# Patient Record
Sex: Female | Born: 1960 | Race: White | Hispanic: No | Marital: Single | Smoking: Never smoker
Health system: Southern US, Community
[De-identification: ages and names within clinical notes are randomized; demographics above are authoritative.]

## PROBLEM LIST (undated history)

## (undated) DIAGNOSIS — E079 Disorder of thyroid, unspecified: Secondary | ICD-10-CM

## (undated) DIAGNOSIS — F4310 Post-traumatic stress disorder, unspecified: Secondary | ICD-10-CM

## (undated) DIAGNOSIS — F32A Depression, unspecified: Secondary | ICD-10-CM

## (undated) DIAGNOSIS — F419 Anxiety disorder, unspecified: Secondary | ICD-10-CM

## (undated) DIAGNOSIS — E785 Hyperlipidemia, unspecified: Secondary | ICD-10-CM

## (undated) DIAGNOSIS — Z803 Family history of malignant neoplasm of breast: Secondary | ICD-10-CM

## (undated) DIAGNOSIS — F329 Major depressive disorder, single episode, unspecified: Secondary | ICD-10-CM

## (undated) HISTORY — DX: Anxiety disorder, unspecified: F41.9

## (undated) HISTORY — DX: Depression, unspecified: F32.A

## (undated) HISTORY — DX: Post-traumatic stress disorder, unspecified: F43.10

## (undated) HISTORY — PX: BREAST BIOPSY: SHX20

## (undated) HISTORY — PX: TOTAL MASTECTOMY: SHX6129

## (undated) HISTORY — DX: Hyperlipidemia, unspecified: E78.5

## (undated) HISTORY — DX: Major depressive disorder, single episode, unspecified: F32.9

## (undated) HISTORY — DX: Family history of malignant neoplasm of breast: Z80.3

## (undated) HISTORY — PX: EYE SURGERY: SHX253

## (undated) HISTORY — DX: Disorder of thyroid, unspecified: E07.9

---

## 1997-05-24 ENCOUNTER — Ambulatory Visit (HOSPITAL_COMMUNITY): Admission: RE | Admit: 1997-05-24 | Discharge: 1997-05-24 | Payer: Self-pay | Admitting: *Deleted

## 1998-07-09 ENCOUNTER — Other Ambulatory Visit: Admission: RE | Admit: 1998-07-09 | Discharge: 1998-07-09 | Payer: Self-pay | Admitting: Obstetrics and Gynecology

## 1998-09-10 ENCOUNTER — Ambulatory Visit (HOSPITAL_COMMUNITY): Admission: RE | Admit: 1998-09-10 | Discharge: 1998-09-10 | Payer: Self-pay | Admitting: Family Medicine

## 1999-04-11 ENCOUNTER — Encounter: Payer: Self-pay | Admitting: Obstetrics and Gynecology

## 1999-04-11 ENCOUNTER — Encounter: Admission: RE | Admit: 1999-04-11 | Discharge: 1999-04-11 | Payer: Self-pay | Admitting: Obstetrics and Gynecology

## 2000-04-15 ENCOUNTER — Encounter: Payer: Self-pay | Admitting: Obstetrics and Gynecology

## 2000-04-15 ENCOUNTER — Encounter: Admission: RE | Admit: 2000-04-15 | Discharge: 2000-04-15 | Payer: Self-pay | Admitting: Obstetrics and Gynecology

## 2000-05-19 ENCOUNTER — Other Ambulatory Visit: Admission: RE | Admit: 2000-05-19 | Discharge: 2000-05-19 | Payer: Self-pay | Admitting: Obstetrics and Gynecology

## 2001-04-29 ENCOUNTER — Encounter: Admission: RE | Admit: 2001-04-29 | Discharge: 2001-04-29 | Payer: Self-pay | Admitting: Obstetrics and Gynecology

## 2001-04-29 ENCOUNTER — Encounter: Payer: Self-pay | Admitting: Obstetrics and Gynecology

## 2001-05-26 ENCOUNTER — Emergency Department (HOSPITAL_COMMUNITY): Admission: EM | Admit: 2001-05-26 | Discharge: 2001-05-26 | Payer: Self-pay | Admitting: Emergency Medicine

## 2001-05-26 ENCOUNTER — Encounter: Payer: Self-pay | Admitting: Emergency Medicine

## 2001-05-30 ENCOUNTER — Emergency Department (HOSPITAL_COMMUNITY): Admission: EM | Admit: 2001-05-30 | Discharge: 2001-05-30 | Payer: Self-pay | Admitting: Emergency Medicine

## 2001-05-30 ENCOUNTER — Encounter: Payer: Self-pay | Admitting: Emergency Medicine

## 2002-02-08 ENCOUNTER — Encounter: Admission: RE | Admit: 2002-02-08 | Discharge: 2002-02-08 | Payer: Self-pay | Admitting: Internal Medicine

## 2002-05-17 ENCOUNTER — Encounter: Payer: Self-pay | Admitting: Obstetrics and Gynecology

## 2002-05-17 ENCOUNTER — Encounter: Admission: RE | Admit: 2002-05-17 | Discharge: 2002-05-17 | Payer: Self-pay | Admitting: Obstetrics and Gynecology

## 2002-07-25 ENCOUNTER — Emergency Department (HOSPITAL_COMMUNITY): Admission: EM | Admit: 2002-07-25 | Discharge: 2002-07-25 | Payer: Self-pay | Admitting: Emergency Medicine

## 2002-07-29 ENCOUNTER — Encounter (HOSPITAL_COMMUNITY): Admission: RE | Admit: 2002-07-29 | Discharge: 2002-09-06 | Payer: Self-pay | Admitting: Emergency Medicine

## 2002-09-30 ENCOUNTER — Encounter: Payer: Self-pay | Admitting: Obstetrics and Gynecology

## 2002-09-30 ENCOUNTER — Encounter: Admission: RE | Admit: 2002-09-30 | Discharge: 2002-09-30 | Payer: Self-pay | Admitting: Obstetrics and Gynecology

## 2002-10-21 ENCOUNTER — Emergency Department (HOSPITAL_COMMUNITY): Admission: EM | Admit: 2002-10-21 | Discharge: 2002-10-22 | Payer: Self-pay | Admitting: Emergency Medicine

## 2003-05-16 ENCOUNTER — Emergency Department (HOSPITAL_COMMUNITY): Admission: EM | Admit: 2003-05-16 | Discharge: 2003-05-16 | Payer: Self-pay | Admitting: Emergency Medicine

## 2004-02-12 ENCOUNTER — Encounter: Admission: RE | Admit: 2004-02-12 | Discharge: 2004-02-12 | Payer: Self-pay | Admitting: Obstetrics and Gynecology

## 2004-02-22 ENCOUNTER — Encounter: Admission: RE | Admit: 2004-02-22 | Discharge: 2004-02-22 | Payer: Self-pay | Admitting: Obstetrics and Gynecology

## 2004-03-04 ENCOUNTER — Encounter: Admission: RE | Admit: 2004-03-04 | Discharge: 2004-03-04 | Payer: Self-pay | Admitting: Obstetrics and Gynecology

## 2004-03-08 ENCOUNTER — Encounter: Admission: RE | Admit: 2004-03-08 | Discharge: 2004-03-08 | Payer: Self-pay | Admitting: Obstetrics and Gynecology

## 2004-03-08 ENCOUNTER — Encounter (INDEPENDENT_AMBULATORY_CARE_PROVIDER_SITE_OTHER): Payer: Self-pay | Admitting: *Deleted

## 2004-07-05 ENCOUNTER — Ambulatory Visit: Payer: Self-pay | Admitting: Psychiatry

## 2004-07-05 ENCOUNTER — Other Ambulatory Visit (HOSPITAL_COMMUNITY): Admission: RE | Admit: 2004-07-05 | Discharge: 2004-07-10 | Payer: Self-pay | Admitting: Psychiatry

## 2004-11-27 ENCOUNTER — Encounter: Admission: RE | Admit: 2004-11-27 | Discharge: 2004-11-27 | Payer: Self-pay | Admitting: Obstetrics and Gynecology

## 2005-04-23 ENCOUNTER — Other Ambulatory Visit: Admission: RE | Admit: 2005-04-23 | Discharge: 2005-04-23 | Payer: Self-pay | Admitting: Obstetrics and Gynecology

## 2005-05-01 ENCOUNTER — Encounter: Admission: RE | Admit: 2005-05-01 | Discharge: 2005-05-01 | Payer: Self-pay | Admitting: Obstetrics and Gynecology

## 2005-05-06 ENCOUNTER — Encounter: Admission: RE | Admit: 2005-05-06 | Discharge: 2005-05-06 | Payer: Self-pay | Admitting: Obstetrics and Gynecology

## 2005-05-14 ENCOUNTER — Encounter: Admission: RE | Admit: 2005-05-14 | Discharge: 2005-05-14 | Payer: Self-pay | Admitting: Obstetrics and Gynecology

## 2005-09-21 ENCOUNTER — Emergency Department (HOSPITAL_COMMUNITY): Admission: EM | Admit: 2005-09-21 | Discharge: 2005-09-21 | Payer: Self-pay | Admitting: Emergency Medicine

## 2006-02-24 ENCOUNTER — Ambulatory Visit: Payer: Self-pay | Admitting: Oncology

## 2006-03-19 ENCOUNTER — Encounter: Admission: RE | Admit: 2006-03-19 | Discharge: 2006-03-19 | Payer: Self-pay | Admitting: Obstetrics and Gynecology

## 2006-07-08 ENCOUNTER — Encounter: Admission: RE | Admit: 2006-07-08 | Discharge: 2006-07-08 | Payer: Self-pay | Admitting: Obstetrics and Gynecology

## 2007-03-17 ENCOUNTER — Encounter: Admission: RE | Admit: 2007-03-17 | Discharge: 2007-03-17 | Payer: Self-pay | Admitting: Obstetrics and Gynecology

## 2007-07-21 ENCOUNTER — Encounter: Admission: RE | Admit: 2007-07-21 | Discharge: 2007-07-21 | Payer: Self-pay | Admitting: Obstetrics and Gynecology

## 2007-09-02 ENCOUNTER — Encounter: Admission: RE | Admit: 2007-09-02 | Discharge: 2007-09-02 | Payer: Self-pay | Admitting: Obstetrics and Gynecology

## 2008-03-31 ENCOUNTER — Encounter: Admission: RE | Admit: 2008-03-31 | Discharge: 2008-03-31 | Payer: Self-pay | Admitting: Obstetrics and Gynecology

## 2008-03-31 ENCOUNTER — Encounter (INDEPENDENT_AMBULATORY_CARE_PROVIDER_SITE_OTHER): Payer: Self-pay | Admitting: Obstetrics and Gynecology

## 2008-05-29 ENCOUNTER — Encounter: Admission: RE | Admit: 2008-05-29 | Discharge: 2008-05-29 | Payer: Self-pay | Admitting: Orthopedic Surgery

## 2008-07-04 ENCOUNTER — Ambulatory Visit: Payer: Self-pay | Admitting: Oncology

## 2008-07-20 LAB — CBC WITH DIFFERENTIAL (CANCER CENTER ONLY)
BASO#: 0.1 10*3/uL (ref 0.0–0.2)
BASO%: 0.8 % (ref 0.0–2.0)
EOS%: 3.2 % (ref 0.0–7.0)
Eosinophils Absolute: 0.2 10*3/uL (ref 0.0–0.5)
HCT: 39.5 % (ref 34.8–46.6)
HGB: 13.8 g/dL (ref 11.6–15.9)
LYMPH#: 2.2 10*3/uL (ref 0.9–3.3)
LYMPH%: 33.9 % (ref 14.0–48.0)
MCH: 30.9 pg (ref 26.0–34.0)
MCHC: 34.9 g/dL (ref 32.0–36.0)
MCV: 89 fL (ref 81–101)
MONO#: 0.4 10*3/uL (ref 0.1–0.9)
MONO%: 5.6 % (ref 0.0–13.0)
NEUT#: 3.6 10*3/uL (ref 1.5–6.5)
NEUT%: 56.5 % (ref 39.6–80.0)
Platelets: 240 10*3/uL (ref 145–400)
RBC: 4.44 10*6/uL (ref 3.70–5.32)
RDW: 12 % (ref 10.5–14.6)
WBC: 6.4 10*3/uL (ref 3.9–10.0)

## 2008-07-20 LAB — CMP (CANCER CENTER ONLY)
ALT(SGPT): 22 U/L (ref 10–47)
AST: 22 U/L (ref 11–38)
Albumin: 3.7 g/dL (ref 3.3–5.5)
Alkaline Phosphatase: 67 U/L (ref 26–84)
BUN, Bld: 13 mg/dL (ref 7–22)
CO2: 30 mEq/L (ref 18–33)
Calcium: 9.2 mg/dL (ref 8.0–10.3)
Chloride: 101 mEq/L (ref 98–108)
Creat: 0.7 mg/dl (ref 0.6–1.2)
Glucose, Bld: 106 mg/dL (ref 73–118)
Potassium: 3.9 mEq/L (ref 3.3–4.7)
Sodium: 140 mEq/L (ref 128–145)
Total Bilirubin: 0.5 mg/dl (ref 0.20–1.60)
Total Protein: 7.5 g/dL (ref 6.4–8.1)

## 2008-07-25 ENCOUNTER — Encounter: Admission: RE | Admit: 2008-07-25 | Discharge: 2008-07-25 | Payer: Self-pay | Admitting: Family Medicine

## 2008-07-25 ENCOUNTER — Ambulatory Visit: Payer: Self-pay | Admitting: Genetic Counselor

## 2008-09-04 ENCOUNTER — Ambulatory Visit: Payer: Self-pay | Admitting: Hematology

## 2008-09-11 ENCOUNTER — Encounter: Admission: RE | Admit: 2008-09-11 | Discharge: 2008-09-11 | Payer: Self-pay | Admitting: Family Medicine

## 2008-10-09 ENCOUNTER — Encounter: Admission: RE | Admit: 2008-10-09 | Discharge: 2008-10-09 | Payer: Self-pay | Admitting: Family Medicine

## 2008-10-11 ENCOUNTER — Ambulatory Visit (HOSPITAL_BASED_OUTPATIENT_CLINIC_OR_DEPARTMENT_OTHER): Admission: RE | Admit: 2008-10-11 | Discharge: 2008-10-11 | Payer: Self-pay | Admitting: Orthopedic Surgery

## 2008-12-19 ENCOUNTER — Ambulatory Visit: Payer: Self-pay | Admitting: Oncology

## 2009-02-24 IMAGING — MG MM DIGITAL SCREENING BILAT W/ CAD
5 series · 5 of 5 positions shown · non-contrast
Comparison: Prior studies.

DG SCREEN MAMMOGRAM BILATERAL
Bilateral CC and MLO view(s) were taken.
Technologist: Hibo Daboss

DIGITAL SCREENING MAMMOGRAM WITH CAD:

[R CC (1 of 2)]
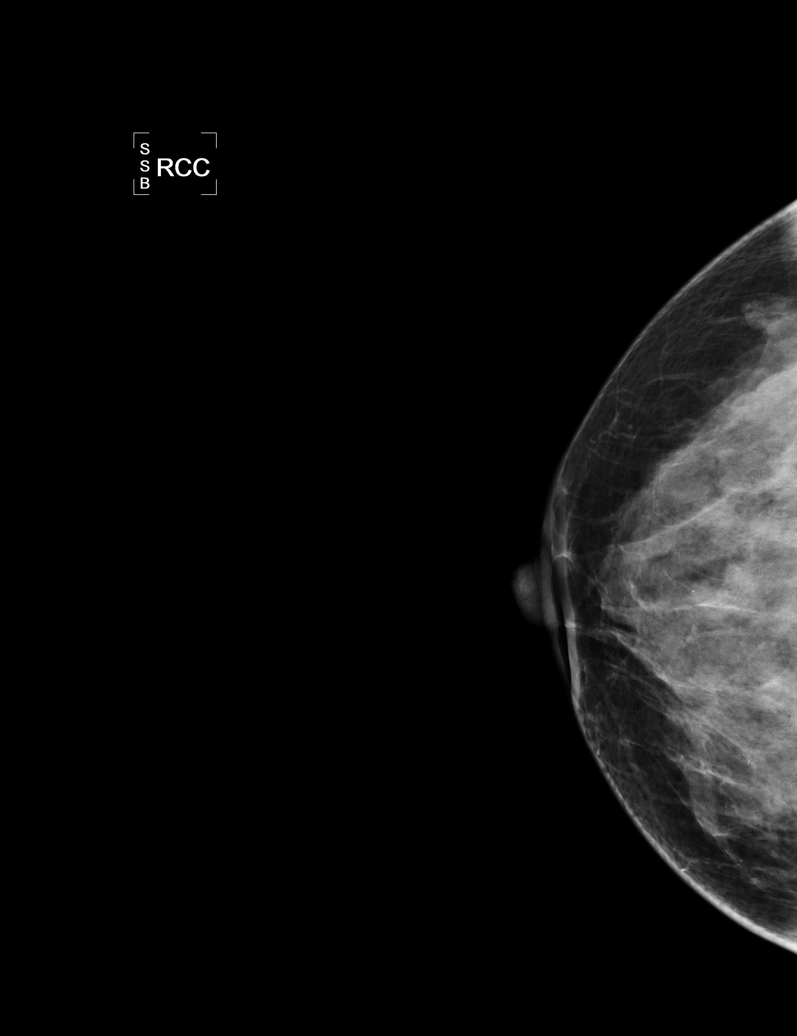

[L CC]
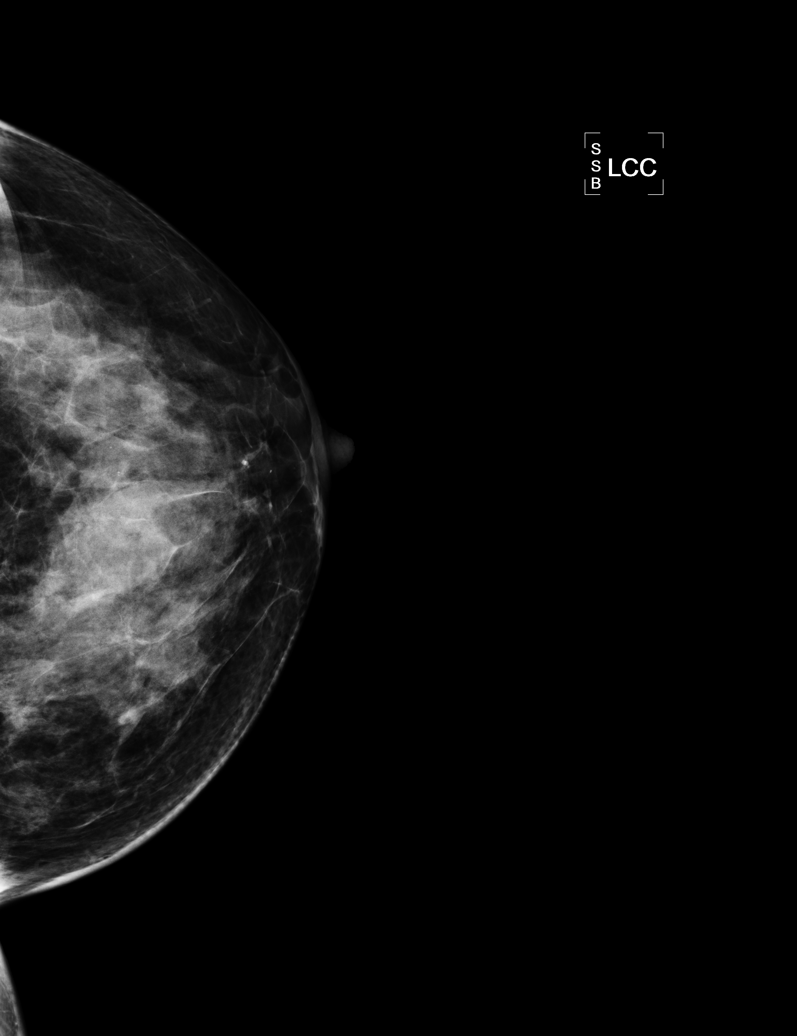

[L MLO]
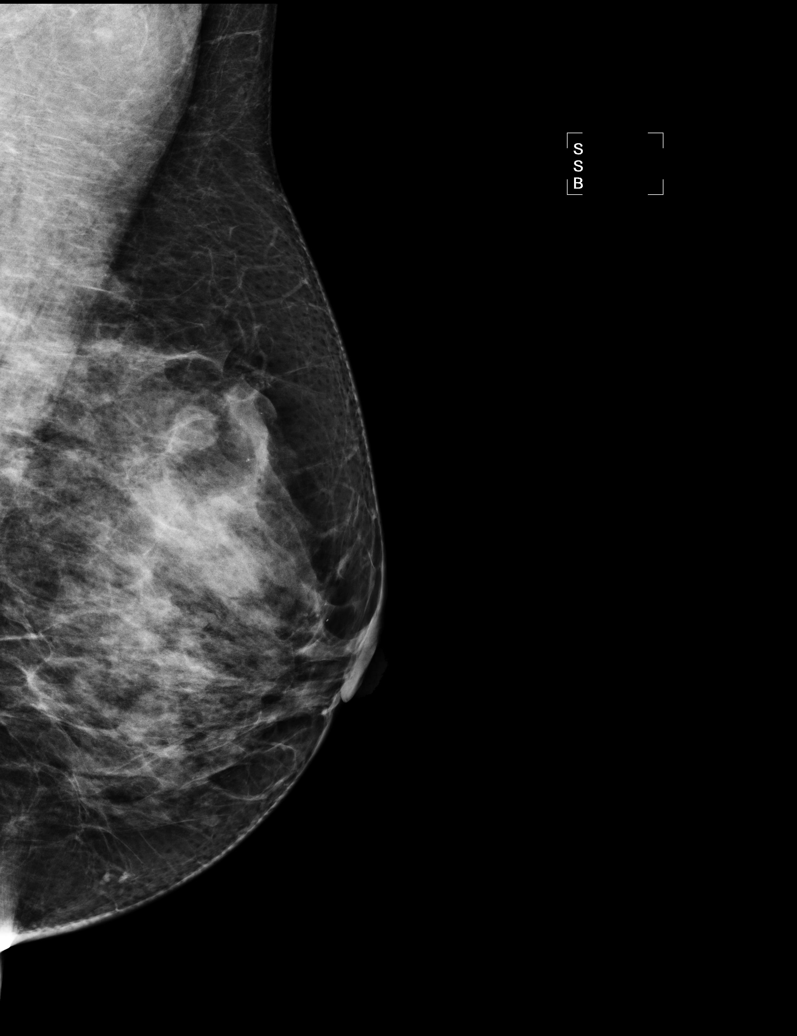

[R MLO]
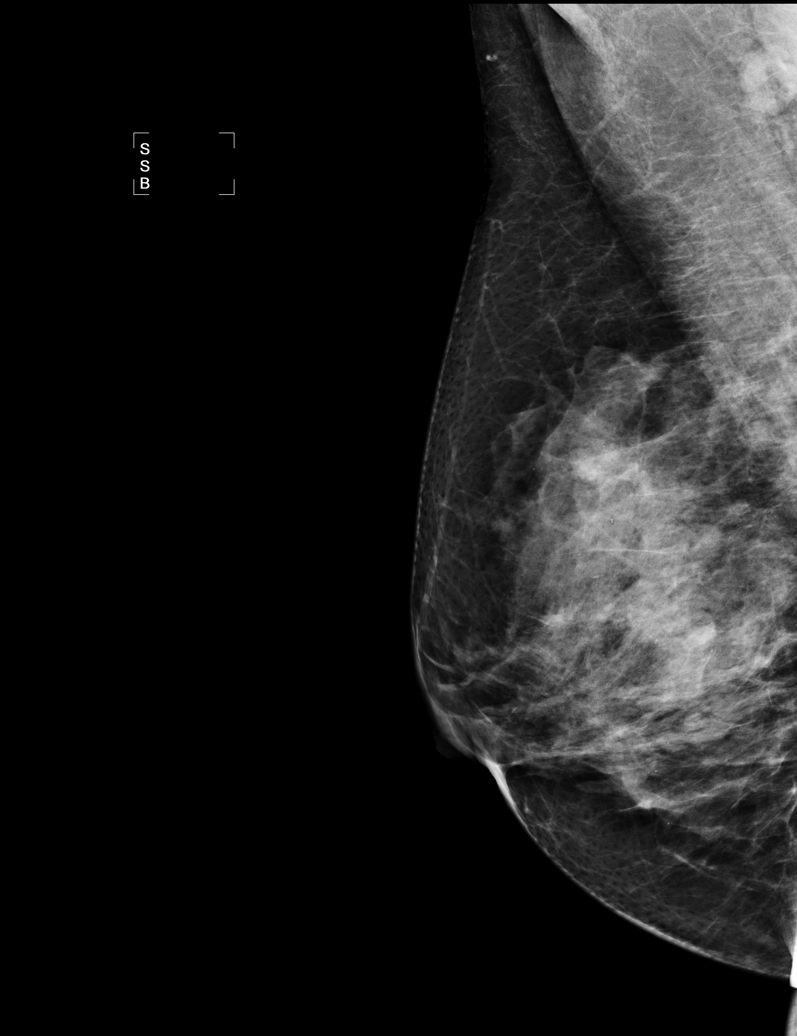

[R CC (2 of 2)]
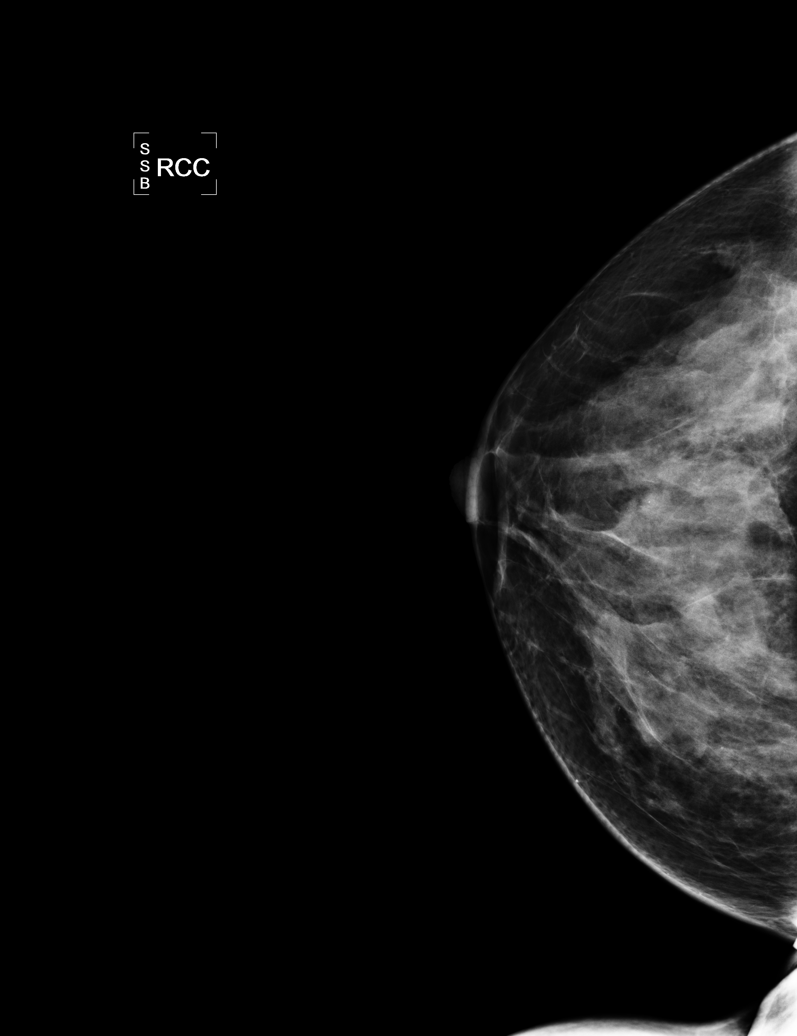

[5 of 5 positions shown; findings below may reference images not displayed]

The breast tissue is extremely dense.  There is no dominant mass, architectural distortion or 
calcification to suggest malignancy.
IMPRESSION: No mammographic evidence of malignancy.  Suggest yearly screening mammography.

ASSESSMENT: Negative - BI-RADS 1

Screening mammogram in 1 year.
ANALYZED BY COMPUTER AIDED DETECTION. , THIS PROCEDURE WAS A DIGITAL MAMMOGRAM.

## 2009-07-31 ENCOUNTER — Encounter: Admission: RE | Admit: 2009-07-31 | Discharge: 2009-07-31 | Payer: Self-pay | Admitting: Family Medicine

## 2009-09-03 ENCOUNTER — Encounter: Admission: RE | Admit: 2009-09-03 | Discharge: 2009-09-03 | Payer: Self-pay | Admitting: Family Medicine

## 2009-10-09 ENCOUNTER — Other Ambulatory Visit: Admission: RE | Admit: 2009-10-09 | Discharge: 2009-10-09 | Payer: Self-pay | Admitting: Family Medicine

## 2010-01-29 ENCOUNTER — Emergency Department (HOSPITAL_COMMUNITY)
Admission: EM | Admit: 2010-01-29 | Discharge: 2010-01-29 | Payer: Self-pay | Source: Home / Self Care | Admitting: Emergency Medicine

## 2010-02-04 LAB — URINALYSIS, ROUTINE W REFLEX MICROSCOPIC
Bilirubin Urine: NEGATIVE
Hgb urine dipstick: NEGATIVE
Ketones, ur: NEGATIVE mg/dL
Nitrite: NEGATIVE
Protein, ur: NEGATIVE mg/dL
Specific Gravity, Urine: 1.01 (ref 1.005–1.030)
Urine Glucose, Fasting: NEGATIVE mg/dL
Urobilinogen, UA: 0.2 mg/dL (ref 0.0–1.0)
pH: 6.5 (ref 5.0–8.0)

## 2010-02-09 ENCOUNTER — Encounter: Payer: Self-pay | Admitting: Obstetrics and Gynecology

## 2010-02-10 ENCOUNTER — Encounter: Payer: Self-pay | Admitting: Obstetrics and Gynecology

## 2010-04-26 LAB — POCT HEMOGLOBIN-HEMACUE: Hemoglobin: 13.6 g/dL (ref 12.0–15.0)

## 2010-05-10 ENCOUNTER — Other Ambulatory Visit: Payer: Self-pay | Admitting: Gastroenterology

## 2010-06-11 ENCOUNTER — Other Ambulatory Visit: Payer: Self-pay | Admitting: Family Medicine

## 2010-06-11 DIAGNOSIS — N6325 Unspecified lump in the left breast, overlapping quadrants: Secondary | ICD-10-CM

## 2010-06-11 DIAGNOSIS — N6315 Unspecified lump in the right breast, overlapping quadrants: Secondary | ICD-10-CM

## 2010-06-13 ENCOUNTER — Other Ambulatory Visit: Payer: Self-pay | Admitting: Family Medicine

## 2010-06-13 DIAGNOSIS — N6325 Unspecified lump in the left breast, overlapping quadrants: Secondary | ICD-10-CM

## 2010-06-13 DIAGNOSIS — N6315 Unspecified lump in the right breast, overlapping quadrants: Secondary | ICD-10-CM

## 2010-06-14 ENCOUNTER — Ambulatory Visit
Admission: RE | Admit: 2010-06-14 | Discharge: 2010-06-14 | Disposition: A | Discharge status: Home / Self Care | Payer: 59 | Source: Ambulatory Visit | Attending: Family Medicine | Admitting: Family Medicine

## 2010-06-14 DIAGNOSIS — N6325 Unspecified lump in the left breast, overlapping quadrants: Secondary | ICD-10-CM

## 2010-06-14 DIAGNOSIS — N6315 Unspecified lump in the right breast, overlapping quadrants: Secondary | ICD-10-CM

## 2010-07-12 ENCOUNTER — Other Ambulatory Visit: Payer: Self-pay | Admitting: Family Medicine

## 2010-07-12 DIAGNOSIS — Z803 Family history of malignant neoplasm of breast: Secondary | ICD-10-CM

## 2010-07-25 ENCOUNTER — Ambulatory Visit
Admission: RE | Admit: 2010-07-25 | Discharge: 2010-07-25 | Disposition: A | Discharge status: Home / Self Care | Payer: 59 | Source: Ambulatory Visit | Attending: Family Medicine | Admitting: Family Medicine

## 2010-07-25 DIAGNOSIS — Z803 Family history of malignant neoplasm of breast: Secondary | ICD-10-CM

## 2011-03-24 ENCOUNTER — Other Ambulatory Visit: Payer: Self-pay | Admitting: Physical Medicine and Rehabilitation

## 2011-03-24 DIAGNOSIS — M549 Dorsalgia, unspecified: Secondary | ICD-10-CM

## 2011-03-25 ENCOUNTER — Other Ambulatory Visit: Payer: Self-pay | Admitting: Physical Medicine and Rehabilitation

## 2011-03-25 ENCOUNTER — Ambulatory Visit
Admission: RE | Admit: 2011-03-25 | Discharge: 2011-03-25 | Disposition: A | Discharge status: Home / Self Care | Payer: 59 | Source: Ambulatory Visit | Attending: Physical Medicine and Rehabilitation | Admitting: Physical Medicine and Rehabilitation

## 2011-03-25 DIAGNOSIS — M47816 Spondylosis without myelopathy or radiculopathy, lumbar region: Secondary | ICD-10-CM

## 2011-03-25 DIAGNOSIS — M549 Dorsalgia, unspecified: Secondary | ICD-10-CM

## 2011-03-25 NOTE — Discharge Instructions (Signed)

## 2011-06-19 ENCOUNTER — Other Ambulatory Visit: Payer: Self-pay | Admitting: Family Medicine

## 2011-06-19 DIAGNOSIS — Z1231 Encounter for screening mammogram for malignant neoplasm of breast: Secondary | ICD-10-CM

## 2011-07-08 ENCOUNTER — Ambulatory Visit
Admission: RE | Admit: 2011-07-08 | Discharge: 2011-07-08 | Disposition: A | Discharge status: Home / Self Care | Payer: 59 | Source: Ambulatory Visit | Attending: Family Medicine | Admitting: Family Medicine

## 2011-07-08 DIAGNOSIS — Z1231 Encounter for screening mammogram for malignant neoplasm of breast: Secondary | ICD-10-CM

## 2011-07-18 ENCOUNTER — Telehealth: Payer: Self-pay | Admitting: *Deleted

## 2011-07-18 NOTE — Telephone Encounter (Signed)
Please schedule her to see me as new patient on 7/3 at 2:30 as new patient = 1hour

## 2011-07-18 NOTE — Telephone Encounter (Signed)
Pt called states" I was a patient of Dr,. Welton Flakes about 3-4 years ago. My mom and both sisters have breast cancer, I had BRCA tests done and I'd like to come in a talk to Dr. Welton Flakes about where I'm at with Breast cancer.I'm thinking about having prophylactic surgery. I dont have Breast cancer anymore so I'm not being followed by any one at this time. I do see Dr. Yolanda Bonine and I've had some scans and things done.   Discussed with pt I would forward her concerns to MD for review. Pt will be notified with more information regarding re-establishing care.

## 2011-07-21 ENCOUNTER — Other Ambulatory Visit: Payer: Self-pay | Admitting: Medical Oncology

## 2011-07-21 ENCOUNTER — Telehealth: Payer: Self-pay | Admitting: Oncology

## 2011-07-21 NOTE — Telephone Encounter (Signed)
S/w the pt and she is aware of her new pt 07/23/2011@2 :00pm.

## 2011-07-23 ENCOUNTER — Telehealth: Payer: Self-pay | Admitting: *Deleted

## 2011-07-23 ENCOUNTER — Other Ambulatory Visit (HOSPITAL_BASED_OUTPATIENT_CLINIC_OR_DEPARTMENT_OTHER): Payer: 59 | Admitting: Lab

## 2011-07-23 ENCOUNTER — Ambulatory Visit (HOSPITAL_BASED_OUTPATIENT_CLINIC_OR_DEPARTMENT_OTHER): Payer: 59 | Admitting: Oncology

## 2011-07-23 ENCOUNTER — Encounter: Payer: Self-pay | Admitting: Oncology

## 2011-07-23 ENCOUNTER — Ambulatory Visit: Payer: 59

## 2011-07-23 VITALS — BP 117/79 | HR 84 | Temp 98.8°F | Ht 61.0 in | Wt 155.1 lb

## 2011-07-23 DIAGNOSIS — Z801 Family history of malignant neoplasm of trachea, bronchus and lung: Secondary | ICD-10-CM

## 2011-07-23 DIAGNOSIS — Z803 Family history of malignant neoplasm of breast: Secondary | ICD-10-CM | POA: Insufficient documentation

## 2011-07-23 DIAGNOSIS — C50919 Malignant neoplasm of unspecified site of unspecified female breast: Secondary | ICD-10-CM

## 2011-07-23 HISTORY — DX: Family history of malignant neoplasm of breast: Z80.3

## 2011-07-23 LAB — COMPREHENSIVE METABOLIC PANEL
ALT: 15 U/L (ref 0–35)
AST: 17 U/L (ref 0–37)
Albumin: 4.3 g/dL (ref 3.5–5.2)
Alkaline Phosphatase: 55 U/L (ref 39–117)
BUN: 8 mg/dL (ref 6–23)
CO2: 28 mEq/L (ref 19–32)
Calcium: 9.6 mg/dL (ref 8.4–10.5)
Chloride: 103 mEq/L (ref 96–112)
Creatinine, Ser: 0.8 mg/dL (ref 0.50–1.10)
Glucose, Bld: 120 mg/dL — ABNORMAL HIGH (ref 70–99)
Potassium: 4.4 mEq/L (ref 3.5–5.3)
Sodium: 138 mEq/L (ref 135–145)
Total Bilirubin: 0.3 mg/dL (ref 0.3–1.2)
Total Protein: 6.6 g/dL (ref 6.0–8.3)

## 2011-07-23 LAB — CBC WITH DIFFERENTIAL/PLATELET
BASO%: 0.7 % (ref 0.0–2.0)
Basophils Absolute: 0.1 10*3/uL (ref 0.0–0.1)
EOS%: 2.9 % (ref 0.0–7.0)
Eosinophils Absolute: 0.2 10*3/uL (ref 0.0–0.5)
HCT: 40.8 % (ref 34.8–46.6)
HGB: 13.3 g/dL (ref 11.6–15.9)
LYMPH%: 31 % (ref 14.0–49.7)
MCH: 31.1 pg (ref 25.1–34.0)
MCHC: 32.7 g/dL (ref 31.5–36.0)
MCV: 95 fL (ref 79.5–101.0)
MONO#: 0.5 10*3/uL (ref 0.1–0.9)
MONO%: 6.6 % (ref 0.0–14.0)
NEUT#: 4.4 10*3/uL (ref 1.5–6.5)
NEUT%: 58.8 % (ref 38.4–76.8)
Platelets: 196 10*3/uL (ref 145–400)
RBC: 4.3 10*6/uL (ref 3.70–5.45)
RDW: 12.4 % (ref 11.2–14.5)
WBC: 7.5 10*3/uL (ref 3.9–10.3)
lymph#: 2.3 10*3/uL (ref 0.9–3.3)

## 2011-07-23 NOTE — Progress Notes (Signed)
OFFICE PROGRESS NOTE  CC Dr. Jillyn Hidden  DIAGNOSIS: 51 year old female who I originally saw back in 07/20/2008 4 counseling for risk reduction. She has a very strong family history of breast cancer but patient herself has no breast cancer or atypical Ductal hyperplasia or microcalcifications.  PRIOR THERAPY:  Patient was originally seen by me back in July 2010 when she presented for counseling regarding ways of her reducing her risk of developing breast cancer. She has a very strong family history of breast cancer in several of her first-degree and second-degree relatives. Her mother had stage I breast cancer in 2000 she was 55 years old. Patient's mom had a mastectomy and then was treated with tamoxifen. Patient had 2 maternal aunts with history of breast cancer. Several aunts with lung cancer. She also had a sister with breast cancer at that was premenopausal diagnosed in 1994.Most recently patient's second sister also was diagnosed with breast cancer was 4 was 48 in June of 2010. This was premenopausal invasive and lobular carcinoma. She was a stage II. This sister apparently underwent mastectomies and nephrectomies. Most recently patient has another sibling.developed breast cancer as well. Patient herself has had multiple breast biopsies including biopsies for microcalcifications. 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 She herself has not been diagnosed with breast cancer. Patient is undergoing yearly mammograms as well as yearly MRI do to her high risk.  Patient has had BRCA1 and 2 testing and she was negative. She did meet with Annia Friendly her genetic counselor back in 2010. Patient and I have discussed chemoprevention with tamoxifen but patient never followed up. She tells me that there was a lot going on in her family and therefore was not able to keep her appointments with me.  CURRENT THERAPY:Patient is here for counseling regarding her risk of  cancers since the diagnosis of another sister having been diagnosed with breast cancer this is her younger sister.  INTERVAL HISTORY: COLTON ENGDAHL 51 y.o. female returns for Followup visit. She remains very concerned about her risk of developing breast cancer. Now she has 3 first degree relatives including mother and 2 sisters. The mother was tested for BRCA1 and 2 and was found to be negative she also had marked testing and that was negative as well. Patient 37 year old sister has been tested as well for BRCA1 and 2 and she is negative as well. The 23 year old sister has not had any testing for the BRCA gene. Patient herself again had testing and she was negative for both BRCA1 and BRCA2 gene tests. Patient is inquiring whether there is another form of genetic predisposition that may be present in her family. She also is thinking about the possibility of having prophylactic mastectomies possibly. But she once all the facts once to know if there is another gene at work in her family that is leading to such high incidence of breast cancer.  MEDICAL HISTORY:No past medical history on file.  ALLERGIES:   has no known allergies.  MEDICATIONS:  Current Outpatient Prescriptions  Medication Sig Dispense Refill  . cetirizine (ZYRTEC) 10 MG tablet Take 10 mg by mouth as needed.      . clonazePAM (KLONOPIN) 2 MG tablet Take 6 mg by mouth at bedtime as needed.      . divalproex (DEPAKOTE) 125 MG DR tablet Take 125 mg by mouth once.      Marland Kitchen HYDROcodone-acetaminophen (NORCO) 7.5-325 MG per tablet Take 1 tablet by mouth every  6 (six) hours as needed.      . meloxicam (MOBIC) 15 MG tablet Take 15 mg by mouth as needed.      . SUMAtriptan-naproxen (TREXIMET) 85-500 MG per tablet Take 1 tablet by mouth as needed.      . traZODone (DESYREL) 100 MG tablet Take 100 mg by mouth at bedtime.      Marland Kitchen venlafaxine (EFFEXOR) 75 MG tablet Take 75 mg by mouth once.      . Vitamin D, Ergocalciferol, (DRISDOL) 50000  UNITS CAPS Take 50,000 Units by mouth every 7 (seven) days.        SURGICAL HISTORY: No past surgical history on file.  REVIEW OF SYSTEMS:  No done  PHYSICAL EXAMINATION: not performed  ECOG PERFORMANCE STATUS: 0 - Asymptomatic  Blood pressure 117/79, pulse 84, temperature 98.8 F (37.1 C), temperature source Oral, height 5\' 1"  (1.549 m), weight 155 lb 1.6 oz (70.353 kg).  LABORATORY DATA: Lab Results  Component Value Date   WBC 7.5 07/23/2011   HGB 13.3 07/23/2011   HCT 40.8 07/23/2011   MCV 95.0 07/23/2011   PLT 196 07/23/2011      Chemistry      Component Value Date/Time   NA 140 07/20/2008 1030   K 3.9 07/20/2008 1030   CL 101 07/20/2008 1030   CO2 30 07/20/2008 1030   BUN 13 07/20/2008 1030   CREATININE 0.7 07/20/2008 1030      Component Value Date/Time   CALCIUM 9.2 07/20/2008 1030   ALKPHOS 67 07/20/2008 1030   AST 22 07/20/2008 1030   BILITOT 0.50 07/20/2008 1030       RADIOGRAPHIC STUDIES:  Mm Digital Screening  07/09/2011  *RADIOLOGY REPORT*  Clinical Data: Screening. The patient has a strong family history of breast cancer.  Cancer was diagnosed in her sisters at age 8 and 25, cancer in her mother at age 54.  Cancer was diagnosed in her aunt at age 31  DIGITAL SCREENING MAMMOGRAM WITH CAD  Comparison:  Previous exams  Findings: The breast tissue is extremely dense. No suspicious masses, architectural distortion, or calcifications are present.  Images were processed with CAD.  IMPRESSION: No evidence for malignancy.  Based on the Tyrer-Cuzick risk assessment model, the patient has a 25% lifetime risk of developing breast cancer.  By the recommendations of the American Cancer Society, annual MRI in and annual mammography are recommended.  A result letter of this screening mammogram will be mailed directly to the patient.  RECOMMENDATION: Screening mammogram in one year. (Code:SM-B-01Y)  BI-RADS CATEGORY 1:  Negative  Original Report Authenticated By: Patterson Hammersmith, M.D.     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 I do think that the patient is at increased risk for developing a primary breast malignancy. I would possibly estimate her risk in the 40 and 50% range based on her family history. Patient's perceived risk is at 70%. But she wants to collect all the facts prior to making any kind of decisions in terms of risk reducing surgeries. We again discussed chemoprevention with tamoxifen and at this time she has not open to this but possibly later. She however is willing to be seen by her surgeon at some point and she is also willing to be seen by the genetic counselor for further risk assessment especially possibly more testing for different genes.   PLAN: I have set her up to be seen by our genetic counselor, Maylon Cos. I spoke  to the genetic counselor myself. I will also eventually set her up to be seen by Dr. Everardo Beals To discuss risk reducing surgeries.In the meantime she will continue to get her mammograms as well as MRIs and do self breast examinations as well as get clinical exams. I again spoke to her about tamoxifen.   All questions were answered. The patient knows to call the clinic with any problems, questions or concerns. We can certainly see the patient much sooner if necessary.  I spent 25 minutes counseling the patient face to face. The total time spent in the appointment was 30 minutes.    Drue Second, MD Medical/Oncology [facility] [phone] (beeper) [phone] (Office)  07/23/2011, 4:40 PM

## 2011-07-23 NOTE — Progress Notes (Signed)
Patient came in today as a new patient and she has one insurance [employer].I did explain to her our financial assistance and co-pay assistance program we offer here at the cancer center,she said she should be [place] with her insurance.

## 2011-07-23 NOTE — Telephone Encounter (Signed)
left voice message to inform the patient of the genetics counseling 09-08-2011

## 2011-07-23 NOTE — Patient Instructions (Addendum)
1. Refer to Diana Ayers

## 2011-08-04 ENCOUNTER — Telehealth: Payer: Self-pay | Admitting: *Deleted

## 2011-08-04 NOTE — Telephone Encounter (Signed)
Patient called wanting to scheduled a genetic appt and I told patient that she was already scheduled for 09/08/11.  Confirmed that date and time was good for her.

## 2011-08-05 ENCOUNTER — Telehealth: Payer: Self-pay | Admitting: *Deleted

## 2011-08-05 NOTE — Telephone Encounter (Signed)
Patient called stating that she is going to be out of town the week of 8/19 and needs to reschedule her appt.  Confirmed 09/25/11 genetic appt w/ pt.

## 2011-08-07 ENCOUNTER — Other Ambulatory Visit: Payer: Self-pay | Admitting: Family Medicine

## 2011-08-07 DIAGNOSIS — Z803 Family history of malignant neoplasm of breast: Secondary | ICD-10-CM

## 2011-08-14 ENCOUNTER — Ambulatory Visit
Admission: RE | Admit: 2011-08-14 | Discharge: 2011-08-14 | Disposition: A | Discharge status: Home / Self Care | Payer: 59 | Source: Ambulatory Visit | Attending: Family Medicine | Admitting: Family Medicine

## 2011-08-14 DIAGNOSIS — Z803 Family history of malignant neoplasm of breast: Secondary | ICD-10-CM

## 2011-08-14 MED ORDER — GADOBENATE DIMEGLUMINE 529 MG/ML IV SOLN
14.0000 mL | Freq: Once | INTRAVENOUS | Status: AC | PRN
  Administered 2011-08-14: 14 mL via INTRAVENOUS

## 2011-08-19 ENCOUNTER — Telehealth: Payer: Self-pay | Admitting: *Deleted

## 2011-08-19 NOTE — Telephone Encounter (Signed)
Called patient since I received a cancellation and confirmed 08/25/11 genetic appt w/ pt.

## 2011-08-25 ENCOUNTER — Ambulatory Visit (HOSPITAL_BASED_OUTPATIENT_CLINIC_OR_DEPARTMENT_OTHER): Payer: 59 | Admitting: Genetic Counselor

## 2011-08-25 ENCOUNTER — Other Ambulatory Visit: Payer: 59 | Admitting: Lab

## 2011-08-25 DIAGNOSIS — Z803 Family history of malignant neoplasm of breast: Secondary | ICD-10-CM

## 2011-08-25 DIAGNOSIS — IMO0002 Reserved for concepts with insufficient information to code with codable children: Secondary | ICD-10-CM

## 2011-08-25 DIAGNOSIS — Z801 Family history of malignant neoplasm of trachea, bronchus and lung: Secondary | ICD-10-CM

## 2011-08-26 ENCOUNTER — Encounter: Payer: Self-pay | Admitting: Genetic Counselor

## 2011-08-26 NOTE — Progress Notes (Signed)
Dr.  Welton Flakes requested a consultation for genetic counseling and risk assessment for Diana Ayers, a 51 y.o. female, for discussion of her family history of breast cancer. She presents to clinic today to discuss the possibility of a genetic predisposition to cancer, and to further clarify her risks, as well as her family members' risks for cancer.   HISTORY OF PRESENT ILLNESS: Diana Ayers is a 51 y.o. female with no personal history of cancer.  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  Past Medical History  Diagnosis Date  . Anxiety   . FHx: malignant neoplasm of breast in first degree relative 07/23/2011    Past Surgical History  Procedure Date  . Breast biopsy     History  Substance Use Topics  . Smoking status: Not on file  . Smokeless tobacco: Not on file  . Alcohol Use:     REPRODUCTIVE HISTORY AND PERSONAL RISK ASSESSMENT FACTORS: Menarche was at age 74.   Premenopausal Uterus Intact: Yes Ovaries Intact: Yes [id number] , first live birth at age N/A  She has not previously undergone treatment for infertility.   Never used OCPs   She has not used HRT in the past.    FAMILY HISTORY:  We obtained a detailed, 4-generation family history.  Significant diagnoses are listed below: Family History  Problem Relation Age of Onset  . Breast cancer Mother 40    BRCA/BART negative  . Lung cancer Mother 42    smoker  . Breast cancer Sister 49  . Melanoma Sister 23  . Dementia Father   . Breast cancer Maternal Aunt 70  . Breast cancer Sister 91    BRCA-/no BART  . Lung cancer Maternal Uncle     smoker  . Lung cancer Maternal Uncle     smoker  . 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  She was tested previously for BRCA mutations, and was found to be negative, but was not tested for BART mutations.  Her older sister was diagnosed with breast cancer at age 55, and tested negative for BRCA  mutations, but was not tested for BART mutations.  Her younger sister was diagnosed with melanoma at age 17, and breast cancer at age 11.  The patient's mother was diagnosed with lung cancer at age 22 and breast cancer at age 62.  Her mother tested negative for both BRCA and BART.  The patient has a maternal aunt who also had breast cancer and died of it in her 59s.  There are two maternal uncles who had lung cancer.  They were smokers.  There are not other reported cancers on either side of the family.  Patient's maternal ancestors are of [place] descent, and paternal ancestors are of [place] descent. There is no reported Ashkenazi Jewish ancestry. There is no known consanguinity.  GENETIC COUNSELING RISK ASSESSMENT, DISCUSSION, AND SUGGESTED FOLLOW UP: We reviewed the natural history and genetic etiology of sporadic, familial and hereditary cancer syndromes.   About 5-10% of breast cancer is hereditary.  Of this, about 85% is the result of a BRCA1 or BRCA2 mutation.  We reviewed the red flags of hereditary cancer syndromes and the dominant inheritance patterns.  Since her BRCA testing is negative, and her mother tested negative for BART mutations, we discussed that we could be testing for the wrong gene.  We discussed gene panels, and that several cancer genes that  are associated with breast cancer can be tested at the same time.  Because of the early onset breast cancer and multiple affected family members that are in the patient's family, we will order a Theme park manager through [employer]  The patient's family history is suggestive of the following possible diagnosis: hereditary cancer syndrome  We discussed that identification of a hereditary cancer syndrome may help her care providers tailor the patients medical management. If a mutation indicating hereditary cancer syndroem is detected in this case, the [employer] recommendations would include increased cancer  surveillance and possible prophylactic sugery. If a mutation is detected, the patient will be referred back to the referring provider and to any additional appropriate care providers to discuss the relevant options.   If a mutation is not found in the patient, this will decrease the likelihood of hereditary cancer syndrome as the explanation for her family history of breast cancer. Cancer surveillance options would be discussed for the patient according to the appropriate standard National Comprehensive Cancer Network and American Cancer Society guidelines, with consideration of their personal and family history risk factors. In this case, the patient will be referred back to their care providers for discussions of management.   Based on the patient's personal and family history, statistical models Dondra Spry Model; Tyrer Cusik)  and literature data were used to estimate her risk of developing breast cancer. These estimate her lifetime risk of developing breast cancer to be approximately 37.8% to 38.4%. This estimation does not take into account any genetic testing results.   After considering the risks, benefits, and limitations, the patient provided informed consent for  the following  testing: [facility] through [employer]   Per the patient's request, we will contact her by telephone to discuss these results. A follow up genetic counseling visit will be scheduled if indicated.  The patient was seen for a total of 90 minutes, greater than 50% of which was spent face-to-face counseling.  This plan is being carried out per Dr. Milta Deiters recommendations.  This note will also be sent to the referring provider via the electronic medical record. The patient will be supplied with a summary of this genetic counseling discussion as well as educational information on the discussed hereditary cancer syndromes following the conclusion of their visit.   Patient was discussed with Dr. Drue Second.   _______________________________________________________________________ For Office Staff:  Number of people involved in session: 2 Was an Intern/ student involved with case: not applicable

## 2011-09-08 ENCOUNTER — Encounter: Payer: 59 | Admitting: Genetic Counselor

## 2011-09-08 ENCOUNTER — Other Ambulatory Visit: Payer: 59 | Admitting: Lab

## 2011-09-18 ENCOUNTER — Encounter: Payer: 59 | Admitting: Genetic Counselor

## 2011-09-25 ENCOUNTER — Other Ambulatory Visit: Payer: 59 | Admitting: Lab

## 2011-09-25 ENCOUNTER — Encounter: Payer: 59 | Admitting: Genetic Counselor

## 2011-12-04 ENCOUNTER — Telehealth: Payer: Self-pay | Admitting: Genetic Counselor

## 2011-12-04 NOTE — Telephone Encounter (Signed)
Revealed negative BreastNext panel testing.  Discussed that if her sisters or mother tested positive, then she would have a 'true negative' test.

## 2011-12-05 ENCOUNTER — Encounter: Payer: Self-pay | Admitting: Genetic Counselor

## 2012-06-30 ENCOUNTER — Other Ambulatory Visit: Payer: Self-pay

## 2012-06-30 DIAGNOSIS — Z803 Family history of malignant neoplasm of breast: Secondary | ICD-10-CM

## 2012-06-30 DIAGNOSIS — Z1231 Encounter for screening mammogram for malignant neoplasm of breast: Secondary | ICD-10-CM

## 2012-06-30 DIAGNOSIS — N6452 Nipple discharge: Secondary | ICD-10-CM

## 2012-06-30 DIAGNOSIS — N6019 Diffuse cystic mastopathy of unspecified breast: Secondary | ICD-10-CM

## 2012-07-14 ENCOUNTER — Other Ambulatory Visit: Payer: Self-pay | Admitting: Family Medicine

## 2012-07-14 DIAGNOSIS — N6452 Nipple discharge: Secondary | ICD-10-CM

## 2012-07-14 DIAGNOSIS — N6019 Diffuse cystic mastopathy of unspecified breast: Secondary | ICD-10-CM

## 2012-07-14 DIAGNOSIS — Z803 Family history of malignant neoplasm of breast: Secondary | ICD-10-CM

## 2012-07-21 ENCOUNTER — Other Ambulatory Visit: Payer: Self-pay | Admitting: Family Medicine

## 2012-07-21 ENCOUNTER — Ambulatory Visit
Admission: RE | Admit: 2012-07-21 | Discharge: 2012-07-21 | Disposition: A | Discharge status: Home / Self Care | Payer: 59 | Source: Ambulatory Visit | Attending: Family Medicine | Admitting: Family Medicine

## 2012-07-21 DIAGNOSIS — N6019 Diffuse cystic mastopathy of unspecified breast: Secondary | ICD-10-CM

## 2012-07-21 DIAGNOSIS — Z803 Family history of malignant neoplasm of breast: Secondary | ICD-10-CM

## 2012-07-21 DIAGNOSIS — N6452 Nipple discharge: Secondary | ICD-10-CM

## 2012-07-26 ENCOUNTER — Other Ambulatory Visit: Payer: Self-pay | Admitting: Family Medicine

## 2012-07-26 DIAGNOSIS — Z87898 Personal history of other specified conditions: Secondary | ICD-10-CM

## 2012-07-26 DIAGNOSIS — Z803 Family history of malignant neoplasm of breast: Secondary | ICD-10-CM

## 2012-07-26 DIAGNOSIS — N6042 Mammary duct ectasia of left breast: Secondary | ICD-10-CM

## 2012-07-27 ENCOUNTER — Other Ambulatory Visit: Payer: 59

## 2012-07-30 ENCOUNTER — Ambulatory Visit: Payer: 59

## 2012-08-09 ENCOUNTER — Other Ambulatory Visit: Payer: 59

## 2012-08-12 ENCOUNTER — Ambulatory Visit
Admission: RE | Admit: 2012-08-12 | Discharge: 2012-08-12 | Disposition: A | Discharge status: Home / Self Care | Payer: 59 | Source: Ambulatory Visit | Attending: Family Medicine | Admitting: Family Medicine

## 2012-08-12 DIAGNOSIS — N6042 Mammary duct ectasia of left breast: Secondary | ICD-10-CM

## 2012-08-12 DIAGNOSIS — Z87898 Personal history of other specified conditions: Secondary | ICD-10-CM

## 2012-08-12 DIAGNOSIS — Z803 Family history of malignant neoplasm of breast: Secondary | ICD-10-CM

## 2012-08-12 MED ORDER — GADOBENATE DIMEGLUMINE 529 MG/ML IV SOLN
13.0000 mL | Freq: Once | INTRAVENOUS | Status: AC | PRN
  Administered 2012-08-12: 13 mL via INTRAVENOUS

## 2012-12-09 ENCOUNTER — Other Ambulatory Visit (HOSPITAL_COMMUNITY)
Admission: RE | Admit: 2012-12-09 | Discharge: 2012-12-09 | Disposition: A | Discharge status: Home / Self Care | Payer: 59 | Source: Ambulatory Visit | Attending: Family Medicine | Admitting: Family Medicine

## 2012-12-09 ENCOUNTER — Other Ambulatory Visit: Payer: Self-pay | Admitting: Family Medicine

## 2012-12-09 DIAGNOSIS — Z0000 Encounter for general adult medical examination without abnormal findings: Secondary | ICD-10-CM | POA: Insufficient documentation

## 2013-05-06 ENCOUNTER — Other Ambulatory Visit: Payer: Self-pay | Admitting: Orthopedic Surgery

## 2013-05-06 DIAGNOSIS — R531 Weakness: Secondary | ICD-10-CM

## 2013-05-06 DIAGNOSIS — R52 Pain, unspecified: Secondary | ICD-10-CM

## 2013-05-10 ENCOUNTER — Ambulatory Visit
Admission: RE | Admit: 2013-05-10 | Discharge: 2013-05-10 | Disposition: A | Discharge status: Home / Self Care | Payer: 59 | Source: Ambulatory Visit | Attending: Orthopedic Surgery | Admitting: Orthopedic Surgery

## 2013-05-10 DIAGNOSIS — R52 Pain, unspecified: Secondary | ICD-10-CM

## 2013-05-10 DIAGNOSIS — R531 Weakness: Secondary | ICD-10-CM

## 2013-08-01 ENCOUNTER — Telehealth: Payer: Self-pay | Admitting: *Deleted

## 2013-08-01 NOTE — Telephone Encounter (Signed)
Pt called stating that she is in question about having more genetic test done or wanting to discuss some preventative measures due to her mom being diagnosed with Breast Cancer again.  Emailed [employer] for one of them to reach out to her and answer the questions and let me know if I need to schedule her for anything.

## 2013-08-04 ENCOUNTER — Other Ambulatory Visit: Payer: Self-pay

## 2013-08-04 DIAGNOSIS — Z1231 Encounter for screening mammogram for malignant neoplasm of breast: Secondary | ICD-10-CM

## 2013-08-16 ENCOUNTER — Ambulatory Visit: Payer: 59

## 2013-08-24 ENCOUNTER — Ambulatory Visit
Admission: RE | Admit: 2013-08-24 | Discharge: 2013-08-24 | Disposition: A | Discharge status: Home / Self Care | Payer: 59 | Source: Ambulatory Visit

## 2013-08-24 DIAGNOSIS — Z1231 Encounter for screening mammogram for malignant neoplasm of breast: Secondary | ICD-10-CM

## 2013-08-25 ENCOUNTER — Other Ambulatory Visit: Payer: Self-pay | Admitting: Family Medicine

## 2013-08-25 DIAGNOSIS — R928 Other abnormal and inconclusive findings on diagnostic imaging of breast: Secondary | ICD-10-CM

## 2013-08-26 ENCOUNTER — Ambulatory Visit
Admission: RE | Admit: 2013-08-26 | Discharge: 2013-08-26 | Disposition: A | Discharge status: Home / Self Care | Payer: 59 | Source: Ambulatory Visit | Attending: Family Medicine | Admitting: Family Medicine

## 2013-08-26 ENCOUNTER — Encounter (INDEPENDENT_AMBULATORY_CARE_PROVIDER_SITE_OTHER): Payer: Self-pay

## 2013-08-26 DIAGNOSIS — R928 Other abnormal and inconclusive findings on diagnostic imaging of breast: Secondary | ICD-10-CM

## 2013-08-31 ENCOUNTER — Other Ambulatory Visit: Payer: 59

## 2013-08-31 ENCOUNTER — Other Ambulatory Visit: Payer: Self-pay | Admitting: Family Medicine

## 2013-08-31 DIAGNOSIS — R928 Other abnormal and inconclusive findings on diagnostic imaging of breast: Secondary | ICD-10-CM

## 2013-09-08 ENCOUNTER — Other Ambulatory Visit: Payer: Self-pay | Admitting: Family Medicine

## 2013-09-08 ENCOUNTER — Ambulatory Visit
Admission: RE | Admit: 2013-09-08 | Discharge: 2013-09-08 | Disposition: A | Discharge status: Home / Self Care | Payer: 59 | Source: Ambulatory Visit | Attending: Family Medicine | Admitting: Family Medicine

## 2013-09-08 DIAGNOSIS — R928 Other abnormal and inconclusive findings on diagnostic imaging of breast: Secondary | ICD-10-CM

## 2013-09-08 MED ORDER — GADOBENATE DIMEGLUMINE 529 MG/ML IV SOLN
15.0000 mL | Freq: Once | INTRAVENOUS | Status: AC | PRN
  Administered 2013-09-08: 15 mL via INTRAVENOUS

## 2013-09-09 ENCOUNTER — Other Ambulatory Visit: Payer: Self-pay | Admitting: Family Medicine

## 2013-09-09 DIAGNOSIS — R928 Other abnormal and inconclusive findings on diagnostic imaging of breast: Secondary | ICD-10-CM

## 2013-09-19 ENCOUNTER — Other Ambulatory Visit: Payer: Self-pay | Admitting: Family Medicine

## 2013-09-19 ENCOUNTER — Ambulatory Visit
Admission: RE | Admit: 2013-09-19 | Discharge: 2013-09-19 | Disposition: A | Discharge status: Home / Self Care | Payer: 59 | Source: Ambulatory Visit | Attending: Family Medicine | Admitting: Family Medicine

## 2013-09-19 DIAGNOSIS — R928 Other abnormal and inconclusive findings on diagnostic imaging of breast: Secondary | ICD-10-CM

## 2013-09-19 MED ORDER — GADOBENATE DIMEGLUMINE 529 MG/ML IV SOLN
15.0000 mL | Freq: Once | INTRAVENOUS | Status: AC | PRN
  Administered 2013-09-19: 15 mL via INTRAVENOUS

## 2013-09-20 ENCOUNTER — Ambulatory Visit
Admission: RE | Admit: 2013-09-20 | Discharge: 2013-09-20 | Disposition: A | Discharge status: Home / Self Care | Payer: 59 | Source: Ambulatory Visit | Attending: Family Medicine | Admitting: Family Medicine

## 2013-09-20 DIAGNOSIS — R928 Other abnormal and inconclusive findings on diagnostic imaging of breast: Secondary | ICD-10-CM

## 2014-02-14 ENCOUNTER — Emergency Department (HOSPITAL_BASED_OUTPATIENT_CLINIC_OR_DEPARTMENT_OTHER)
Admission: EM | Admit: 2014-02-14 | Discharge: 2014-02-14 | Disposition: A | Discharge status: Home / Self Care | Payer: No Typology Code available for payment source | Attending: Emergency Medicine | Admitting: Emergency Medicine

## 2014-02-14 ENCOUNTER — Encounter (HOSPITAL_BASED_OUTPATIENT_CLINIC_OR_DEPARTMENT_OTHER): Payer: Self-pay | Admitting: *Deleted

## 2014-02-14 DIAGNOSIS — S39012A Strain of muscle, fascia and tendon of lower back, initial encounter: Secondary | ICD-10-CM | POA: Insufficient documentation

## 2014-02-14 DIAGNOSIS — Z79899 Other long term (current) drug therapy: Secondary | ICD-10-CM | POA: Insufficient documentation

## 2014-02-14 DIAGNOSIS — Y998 Other external cause status: Secondary | ICD-10-CM | POA: Insufficient documentation

## 2014-02-14 DIAGNOSIS — Y9389 Activity, other specified: Secondary | ICD-10-CM | POA: Insufficient documentation

## 2014-02-14 DIAGNOSIS — F419 Anxiety disorder, unspecified: Secondary | ICD-10-CM | POA: Insufficient documentation

## 2014-02-14 DIAGNOSIS — Y92410 Unspecified street and highway as the place of occurrence of the external cause: Secondary | ICD-10-CM | POA: Insufficient documentation

## 2014-02-14 MED ORDER — IBUPROFEN 400 MG PO TABS
600.0000 mg | ORAL_TABLET | Freq: Once | ORAL | Status: AC
  Administered 2014-02-14: 600 mg via ORAL
  Filled 2014-02-14 (×2): qty 1

## 2014-02-14 MED ORDER — CYCLOBENZAPRINE HCL 10 MG PO TABS: 10.0000 mg | ORAL_TABLET | Freq: Two times a day (BID) | ORAL | Status: DC | PRN
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
## 2014-02-14 NOTE — Discharge Instructions (Signed)

## 2014-05-19 ENCOUNTER — Other Ambulatory Visit: Payer: Self-pay | Admitting: Family Medicine

## 2014-05-19 DIAGNOSIS — R928 Other abnormal and inconclusive findings on diagnostic imaging of breast: Secondary | ICD-10-CM

## 2014-05-19 DIAGNOSIS — Z803 Family history of malignant neoplasm of breast: Secondary | ICD-10-CM

## 2014-06-09 ENCOUNTER — Ambulatory Visit
Admission: RE | Admit: 2014-06-09 | Discharge: 2014-06-09 | Disposition: A | Discharge status: Home / Self Care | Payer: 59 | Source: Ambulatory Visit | Attending: Family Medicine | Admitting: Family Medicine

## 2014-06-09 DIAGNOSIS — R928 Other abnormal and inconclusive findings on diagnostic imaging of breast: Secondary | ICD-10-CM

## 2014-06-09 DIAGNOSIS — Z803 Family history of malignant neoplasm of breast: Secondary | ICD-10-CM

## 2014-06-09 MED ORDER — GADOBENATE DIMEGLUMINE 529 MG/ML IV SOLN
15.0000 mL | Freq: Once | INTRAVENOUS | Status: AC | PRN
  Administered 2014-06-09: 15 mL via INTRAVENOUS

## 2014-06-16 ENCOUNTER — Encounter: Payer: 59 | Attending: Family Medicine | Admitting: *Deleted

## 2014-06-16 ENCOUNTER — Encounter: Payer: Self-pay | Admitting: *Deleted

## 2014-06-16 DIAGNOSIS — E639 Nutritional deficiency, unspecified: Secondary | ICD-10-CM | POA: Insufficient documentation

## 2014-06-16 DIAGNOSIS — Z713 Dietary counseling and surveillance: Secondary | ICD-10-CM | POA: Insufficient documentation
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
## 2014-07-13 ENCOUNTER — Telehealth: Payer: Self-pay | Admitting: *Deleted

## 2014-07-13 NOTE — Telephone Encounter (Signed)
Received a referral from [facility].  Called pt and confirmed 07/27/14 high risk appt w/ her.  Mailed calendar, welcoming packet & intake form to pt.  Emailed Engineer, civil (consulting) at [employer] to make her aware.  Placed a copy of records in Dr. Virgie Dad box and took one to HIM to scan.

## 2014-07-24 NOTE — Progress Notes (Signed)
[facility]  [phone] [phone]     ID: Diana Ayers DOB: 1960/12/28  MR#: [id number]  ZTI#:[id number]  Patient Care Team: Antony Blackbird, MD as PCP - General (Family Medicine) PCP: Antony Blackbird, MD GYN: SU:  OTHER MD:  CHIEF COMPLAINT:   CURRENT TREATMENT:    BREAST CANCER HISTORY: From Dr. Bernell List Khan's 07/23/2011 note:  "Patient was originally seen by me back in July 2010 when she presented for counseling regarding ways of her reducing her risk of developing breast cancer. She has a very strong family history of breast cancer in several of her first-degree and second-degree relatives. Her mother had stage I breast cancer in 2000 she was 37 years old. Patient's mom had a mastectomy and then was treated with tamoxifen. Patient had 2 maternal aunts with history of breast cancer. Several aunts with lung cancer. She also had a sister with breast cancer at that was premenopausal diagnosed in 1994.Most recently patient's second sister also was diagnosed with breast cancer was 4 was 4 in June of 2010. This was premenopausal invasive and lobular carcinoma. She was a stage II. This sister apparently underwent mastectomies and nephrectomies. Most recently patient has another sibling.developed breast cancer as well. Patient herself has had multiple breast biopsies including biopsies for microcalcifications. 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 She herself has not been diagnosed with breast cancer. Patient is undergoing yearly mammograms as well as yearly MRI do to her high risk.  Patient has had BRCA1 and 2 testing and she was negative. She did meet with Stefanie Libel her genetic counselor back in 2010. Patient and I have discussed chemoprevention with tamoxifen but patient never followed up. She tells me that there was a lot going on in her family and therefore was not able to keep her appointments with  me."  Patient now returns for further discussion of risks reduction in the setting of strong family history for breast cancer.  INTERVAL HISTORY: Diana Ayers returns to the breast clinic today accompanied by her friend Judeen Hammans. Diana Ayers is establishing herself on my service today. She wishes to discuss the possibility of bilateral mastectomies. -- to review briefly: Diana Ayers's mother has been diagnosed with breast cancer at 2, at age 41 and then again at age 55. 93 of Diana Ayers's mother's sisters, Diana Ayers's maternal aunts, were also diagnosed with breast cancer. Diana Ayers has 2 sisters and both of them have been diagnosed with breast cancer at age 55 and 67. The 28 year old sister has developed a second breast cancer as has Diana Ayers's mother.-- Diana Ayers herself has been tested through the [employer] panel (16 genes associated with hereditarybreast cancer) on 08/25/2011, and was found to be negative. The patient's mother and older sister have been tested for BRCA1 and 2 on are negative.. They also had BART testing, again non-revealing.   REVIEW OF SYSTEMS: Diana Ayers has problems with chronic back pain, but these are not more intense or persistent than before. She has night sweats more than hot flashes. She has frequent headaches. Again these are not more intense or persistent than usual. She feels forgetful. She admits to anxiety and depression. Sometimes she has suicidal thoughts, she says, although not currently. 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 A detailed review of systems today was otherwise noncontributory  PAST MEDICAL HISTORY: Past Medical History  Diagnosis Date  . Anxiety   . FHx: malignant neoplasm of breast in  first degree relative 55/03/2011  . Hyperlipidemia   . PTSD (post-traumatic stress disorder)   . Depression     PAST SURGICAL HISTORY: Past Surgical History  Procedure Laterality Date  . Breast biopsy      FAMILY HISTORY Family History  Problem Relation Age of  Onset  . Breast cancer Mother 63    BRCA/BART negative  . Lung cancer Mother 53    smoker  . Breast cancer Sister 55  . Melanoma Sister 55  . Dementia Father   . Breast cancer Maternal Aunt 55  . Breast cancer Sister 55    BRCA-/no BART  . Lung cancer Maternal Uncle     smoker  . Lung cancer Maternal Uncle     smoker  . Heart attack Maternal Aunt    aside from they've family history of breast cancer, outlined above, there  are two maternal uncles who had lung cancer. They were smokers. There are not other reported cancers on either side of the family and specifically there is no history of ovarian or colon cancers.  Patient's maternal ancestors are of [place] descent, and paternal ancestors are of [place] descent. There is no reported Ashkenazi Jewish ancestry. There is no known consanguinity.  GYNECOLOGIC HISTORY:  No LMP recorded. Menarche age 14. The patient is GX P0. Most recent period was May 2016. The patient never took oral contraceptives.  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 At home she lives with Judeen Hammans who is used Mudlogger for of [employer] in [place]. They share a cat.    ADVANCED DIRECTIVES: In place   HEALTH MAINTENANCE: History  Substance Use Topics  . Smoking status: Never Smoker   . Smokeless tobacco: Not on file  . Alcohol Use: No     Colonoscopy: 2015  PAP: 2014  Bone density:  Lipid panel:  Allergies  Allergen Reactions  . Onion   . Tape Other (See Comments)    RASH, PT ALSO STATES RASH WITH COBAN    Current Outpatient Prescriptions  Medication Sig Dispense Refill  . clonazePAM (KLONOPIN) 2 MG tablet Take 6 mg by mouth at bedtime as needed.    . divalproex (DEPAKOTE) 125 MG DR tablet Take 125 mg by mouth once.    .  nortriptyline (PAMELOR) 25 MG capsule Take 25 mg by mouth at bedtime.    Marland Kitchen venlafaxine (EFFEXOR) 75 MG tablet Take 75 mg by mouth once.    . loratadine (CLARITIN) 10 MG tablet Take 10 mg by mouth daily.    . Multiple Vitamin (MULTIVITAMIN) tablet Take 1 tablet by mouth daily.    . traZODone (DESYREL) 100 MG tablet Take 100 mg by mouth at bedtime.    Marland Kitchen zolmitriptan (ZOMIG) 5 MG nasal solution Place into the nose as needed for migraine.     No current facility-administered medications for this visit.    OBJECTIVE: Middle-aged white woman who appears stated age 54 Vitals:   07/27/14 1631  BP: 119/70  Pulse: 82  Temp: 98.6 F (37 C)  Resp: 18     Body mass index is 30.25 kg/(m^2).    ECOG FS:1 - Symptomatic but completely ambulatory  Ocular: Sclerae unicteric, pupils equal, round and reactive to light Ear-nose-throat: Oropharynx clear and moist Lymphatic: No cervical or supraclavicular adenopathy Lungs no rales or rhonchi,  good excursion bilaterally Heart regular rate and rhythm, no murmur appreciated Abd soft, nontender, positive bowel sounds MSK no focal spinal tenderness, no joint edema Neuro: non-focal, well-oriented, appropriate affect--in particular there is no obvious disturbance of mood, patient's speech is articulate and her thoughts orderly and well reasoned. She has a good basic understanding of her situation from a factual/medical point of view.  Breasts: No suspicious masses palpated in either breast. Both axillae are benign.   LAB RESULTS:  CMP     Component Value Date/Time   NA 138 07/23/2011 1427   NA 140 07/20/2008 1030   K 4.4 07/23/2011 1427   K 3.9 07/20/2008 1030   CL 103 07/23/2011 1427   CL 101 07/20/2008 1030   CO2 28 07/23/2011 1427   CO2 30 07/20/2008 1030   GLUCOSE 120* 07/23/2011 1427   GLUCOSE 106 07/20/2008 1030   BUN 8 07/23/2011 1427   BUN 13 07/20/2008 1030   CREATININE 0.80 07/23/2011 1427   CREATININE 0.7 07/20/2008 1030   CALCIUM  9.6 07/23/2011 1427   CALCIUM 9.2 07/20/2008 1030   PROT 6.6 07/23/2011 1427   PROT 7.5 07/20/2008 1030   ALBUMIN 4.3 07/23/2011 1427   AST 17 07/23/2011 1427   AST 22 07/20/2008 1030   ALT 15 07/23/2011 1427   ALT 22 07/20/2008 1030   ALKPHOS 55 07/23/2011 1427   ALKPHOS 67 07/20/2008 1030   BILITOT 0.3 07/23/2011 1427   BILITOT 0.50 07/20/2008 1030    INo results found for: SPEP, UPEP  Lab Results  Component Value Date   WBC 7.5 07/23/2011   NEUTROABS 4.4 07/23/2011   HGB 13.3 07/23/2011   HCT 40.8 07/23/2011   MCV 95.0 07/23/2011   PLT 196 07/23/2011      Chemistry      Component Value Date/Time   NA 138 07/23/2011 1427   NA 140 07/20/2008 1030   K 4.4 07/23/2011 1427   K 3.9 07/20/2008 1030   CL 103 07/23/2011 1427   CL 101 07/20/2008 1030   CO2 28 07/23/2011 1427   CO2 30 07/20/2008 1030   BUN 8 07/23/2011 1427   BUN 13 07/20/2008 1030   CREATININE 0.80 07/23/2011 1427   CREATININE 0.7 07/20/2008 1030      Component Value Date/Time   CALCIUM 9.6 07/23/2011 1427   CALCIUM 9.2 07/20/2008 1030   ALKPHOS 55 07/23/2011 1427   ALKPHOS 67 07/20/2008 1030   AST 17 07/23/2011 1427   AST 22 07/20/2008 1030   ALT 15 07/23/2011 1427   ALT 22 07/20/2008 1030   BILITOT 0.3 07/23/2011 1427   BILITOT 0.50 07/20/2008 1030       No results found for: LABCA2  No components found for: LABCA125  No results for input(s): INR in the last 168 hours.  Urinalysis    Component Value Date/Time   COLORURINE YELLOW 01/29/2010 1600   APPEARANCEUR CLEAR 01/29/2010 1600   LABSPEC 1.010 01/29/2010 1600   PHURINE 6.5 01/29/2010 1600   HGBUR NEGATIVE 01/29/2010 1600   BILIRUBINUR NEGATIVE 01/29/2010 1600   KETONESUR NEGATIVE 01/29/2010 1600   PROTEINUR NEGATIVE 01/29/2010 1600   UROBILINOGEN 0.2 01/29/2010 1600   NITRITE NEGATIVE 01/29/2010 1600   LEUKOCYTESUR  01/29/2010 1600    NEGATIVE MICROSCOPIC NOT DONE ON URINES WITH NEGATIVE PROTEIN, BLOOD, LEUKOCYTES, NITRITE,  OR GLUCOSE <1000 mg/dL.    STUDIES: CLINICAL DATA: High risk screening MRI due to strong family history of breast cancer. Negative MRI guided left breast biopsy August  2015 demonstrating fibrocystic change, adenosis and usual ductal hyperplasia.  LABS: None.  EXAM: BILATERAL BREAST MRI WITH AND WITHOUT CONTRAST  TECHNIQUE: Multiplanar, multisequence MR images of both breasts were obtained prior to and following the intravenous administration of 15 ml of MultiHance.  THREE-DIMENSIONAL MR IMAGE RENDERING ON INDEPENDENT WORKSTATION:  Three-dimensional MR images were rendered by post-processing of the original MR data on an independent workstation. The three-dimensional MR images were interpreted, and findings are reported in the following complete MRI report for this study. Three dimensional images were evaluated at the independent DynaCad workstation  COMPARISON: Previous exam(s).  FINDINGS: Breast composition: c. Heterogeneous fibroglandular tissue.  Background parenchymal enhancement: Moderate.  Right breast: No mass or abnormal enhancement. Multiple simple cysts present.  Left breast: No mass or abnormal enhancement. Multiple simple cysts present. Clip artifact present at previous benign biopsy sites.  Lymph nodes: No abnormal appearing lymph nodes.  Ancillary findings: None.  IMPRESSION: Breast MRI within normal without evidence of malignancy. Multiple bilateral simple cysts.  RECOMMENDATION: The American Cancer Society recommends annual MRI and mammography in patients with an estimated lifetime risk of developing breast cancer greater than 20 - 25%, or who are known or suspected to be positive for the breast cancer gene. Recommend followup as clinically indicated.  BI-RADS CATEGORY 2: Benign.   Electronically Signed  By: Marin Olp M.D.  On: 06/09/2014 16:47  ASSESSMENT: 54 y.o.  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 The fact that we have not been able to identify the specific mutation that runs in her family just means that we are not sufficiently smart. It also means however that we cannot prove that she does not carry whatever that mutation is. This is what places her at high risk.  We discussed the possibility of tamoxifen, which does reduce the risk of developing estrogen receptor positive breast cancer. However it would only partially reduced the risk. At the same time tamoxifen itself is not without risks including risks of blood clots and endometrial carcinoma. These are the reasons Adilen has been reluctant to take that drug.  Continuing yearly mammography/tomography/ultrasonography/MRIs of the breast also has its drawbacks. Over the ensuing years this would add up to more than $500,000, not counting of course the cost of biopsies and other procedures that may be necessary. More importantly, these repeated procedures and tests are nerve-racking to this patient who sees her mother and sisters not only diagnosed with breast cancer but experiencing breast cancer a second time..  I think Diana Ayers's decision to go for bilateral mastectomies is entirely reasonable and I would make a similar choice in her situation. I did ask her to consider bilateral nipple sparing reconstruction, since this is an option she will lose if she does not exercise at at the time of initial surgery. Of course she can have reconstruction later but it would have to be a different procedure, and the results we are getting from nipple sparing reconstruction are extraordinarily favorable.  At this point however she is thinking of  bilateral mastectomies only, with no reconstruction. She has put a lot of thought into this and she has done a great deal of research including meeting with  plastic surgery. She appears to be comfortable with that decision. I fully support it.  We did review general cancer screening, and she does get yearly skin exam by a dermatologist, gets regular Pap smears, and is up-to-date on her colonoscopies. She is being very proactive with regards to reducing her risks of developing cancer of any type.  I am not making a return appointment here for Xee, but she knows I will be glad to see her again anytime in the future if and when the need arises.   The patient has a good understanding of the overall plan. She agrees with it. She knows the goal of treatment in her case is cure. She will call with any problems that may develop before her next visit here.  Chauncey Cruel, MD   07/27/2014 5:36 PM Medical Oncology and Hematology [facility] [address] [place], [place] [address] Tel. [phone]    Fax. [phone]

## 2014-07-27 ENCOUNTER — Encounter: Payer: Self-pay | Admitting: Oncology

## 2014-07-27 ENCOUNTER — Ambulatory Visit (HOSPITAL_BASED_OUTPATIENT_CLINIC_OR_DEPARTMENT_OTHER): Payer: 59 | Admitting: Oncology

## 2014-07-27 ENCOUNTER — Ambulatory Visit: Payer: 59

## 2014-07-27 VITALS — BP 119/70 | HR 82 | Temp 98.6°F | Resp 18 | Ht 63.0 in | Wt 170.7 lb

## 2014-07-27 DIAGNOSIS — Z803 Family history of malignant neoplasm of breast: Secondary | ICD-10-CM | POA: Diagnosis not present

## 2014-07-27 DIAGNOSIS — Z1239 Encounter for other screening for malignant neoplasm of breast: Secondary | ICD-10-CM

## 2014-07-27 NOTE — Progress Notes (Signed)
Checked in new pt with no financial concerns. °

## 2014-08-02 ENCOUNTER — Other Ambulatory Visit: Payer: Self-pay | Admitting: Oncology

## 2014-08-02 NOTE — Progress Notes (Unsigned)
Copying genetics counselor note from 07/31/2014:   Yes.  I had seen her as well, and we did testing, maybe that was when the breastnext was performed.  There are slightly different genes on the panel now, she was tested for RAD51D, which is no longer on there, and they added STK11 and NF1 since she was tested.  I think it is pretty minimal risk for her to be positive for either of these genes, and I think we have offered her everything we can at this time.  We can offer her updated testing, and from memory of this patient (she is very memorable!!!) she would take it, but I think that the chance that these genes would be positive (or if we tested her through [facility] who offers a few additional genes) would be low.  Santiago Glad

## 2014-08-03 ENCOUNTER — Encounter: Payer: Self-pay | Admitting: *Deleted

## 2014-08-03 ENCOUNTER — Encounter: Payer: 59 | Attending: Family Medicine | Admitting: *Deleted

## 2014-08-03 DIAGNOSIS — Z713 Dietary counseling and surveillance: Secondary | ICD-10-CM | POA: Insufficient documentation

## 2014-08-03 DIAGNOSIS — E639 Nutritional deficiency, unspecified: Secondary | ICD-10-CM | POA: Insufficient documentation

## 2014-08-03 DIAGNOSIS — E663 Overweight: Secondary | ICD-10-CM

## 2014-08-03 NOTE — Progress Notes (Signed)
Appointment start time: 1200  Appointment end time: 1300  Assessment:  Diana Ayers is here for follow up nutrition counseling pertaining to desire to lose weight.  She is frustrated because she feels like she has gained weight.  She has not been exercising lately and is struggling with her eating because she is in the midst of a major struggle with her depression and PTSD.  Her therapist is out of town this week and Diana Ayers is struggling.  After much discussion about her mental health concerns, she wonders at loud if she needs a calorie prescription and if fruits are bad? Her roommate follows a very strict diet and exercise regimen and has been giving Diana Ayers some untrue nutrition advice  Nutrition Diagnosis:  Limited adherence to nutrition-related advice as related to depression.  As evidenced by patient self-report.   Intervention:  Actively listened to her concerns and struggles.  Provided positive affirmation and encouraged her to get additional support.  Gave contact information for [facility], crisis assistance line, suggested she reach out to her friend who she trusts, and suggested attending a support group which she has dismissed in the past.  She did not dismiss this idea today and expressed appreciation for the resources.   Suggested using exercise as a therapeutic tool, not a weight-loss tool.  She has a punching bag and she would like to use that or go for a relaxing walk.   Suggested portioned plates so she doesn't have to stress about calorie counting or measuring foods.  Recommended eating without distractions and to eat more slowly.  Reiterated whole grains, lean protein, and fruits and vegetables  Monitoring: prn

## 2015-05-11 ENCOUNTER — Other Ambulatory Visit: Payer: Self-pay | Admitting: Orthopedic Surgery

## 2015-05-23 ENCOUNTER — Ambulatory Visit (HOSPITAL_BASED_OUTPATIENT_CLINIC_OR_DEPARTMENT_OTHER): Admission: RE | Admit: 2015-05-23 | Payer: 59 | Source: Ambulatory Visit | Admitting: Orthopedic Surgery

## 2015-05-23 ENCOUNTER — Encounter (HOSPITAL_BASED_OUTPATIENT_CLINIC_OR_DEPARTMENT_OTHER): Admission: RE | Payer: Self-pay | Source: Ambulatory Visit

## 2015-05-23 SURGERY — ARTHROSCOPY, KNEE
Anesthesia: Choice | Laterality: Right

## 2015-10-12 ENCOUNTER — Other Ambulatory Visit: Payer: Self-pay | Admitting: Internal Medicine

## 2015-10-12 DIAGNOSIS — E041 Nontoxic single thyroid nodule: Secondary | ICD-10-CM

## 2015-10-12 DIAGNOSIS — E063 Autoimmune thyroiditis: Secondary | ICD-10-CM

## 2015-10-31 ENCOUNTER — Ambulatory Visit
Admission: RE | Admit: 2015-10-31 | Discharge: 2015-10-31 | Disposition: A | Discharge status: Home / Self Care | Payer: 59 | Source: Ambulatory Visit | Attending: Internal Medicine | Admitting: Internal Medicine

## 2015-10-31 DIAGNOSIS — E063 Autoimmune thyroiditis: Secondary | ICD-10-CM

## 2015-10-31 DIAGNOSIS — E041 Nontoxic single thyroid nodule: Secondary | ICD-10-CM

## 2015-11-08 ENCOUNTER — Other Ambulatory Visit: Payer: Self-pay | Admitting: Internal Medicine

## 2015-11-08 DIAGNOSIS — E041 Nontoxic single thyroid nodule: Secondary | ICD-10-CM

## 2016-01-24 DIAGNOSIS — Z9013 Acquired absence of bilateral breasts and nipples: Secondary | ICD-10-CM | POA: Diagnosis not present

## 2016-01-24 DIAGNOSIS — R0789 Other chest pain: Secondary | ICD-10-CM | POA: Diagnosis not present

## 2016-01-29 DIAGNOSIS — R0789 Other chest pain: Secondary | ICD-10-CM | POA: Diagnosis not present

## 2016-01-29 DIAGNOSIS — Z9013 Acquired absence of bilateral breasts and nipples: Secondary | ICD-10-CM | POA: Diagnosis not present

## 2016-08-26 DIAGNOSIS — M1711 Unilateral primary osteoarthritis, right knee: Secondary | ICD-10-CM | POA: Diagnosis not present

## 2016-08-26 DIAGNOSIS — M222X1 Patellofemoral disorders, right knee: Secondary | ICD-10-CM | POA: Diagnosis not present

## 2016-09-30 DIAGNOSIS — Z79899 Other long term (current) drug therapy: Secondary | ICD-10-CM | POA: Diagnosis not present

## 2016-10-08 DIAGNOSIS — M1711 Unilateral primary osteoarthritis, right knee: Secondary | ICD-10-CM | POA: Diagnosis not present

## 2016-10-15 DIAGNOSIS — M1711 Unilateral primary osteoarthritis, right knee: Secondary | ICD-10-CM | POA: Diagnosis not present

## 2016-10-22 DIAGNOSIS — M1711 Unilateral primary osteoarthritis, right knee: Secondary | ICD-10-CM | POA: Diagnosis not present

## 2016-12-01 DIAGNOSIS — Z23 Encounter for immunization: Secondary | ICD-10-CM | POA: Diagnosis not present

## 2016-12-17 DIAGNOSIS — Z9011 Acquired absence of right breast and nipple: Secondary | ICD-10-CM | POA: Diagnosis not present

## 2016-12-17 DIAGNOSIS — Z9012 Acquired absence of left breast and nipple: Secondary | ICD-10-CM | POA: Diagnosis not present

## 2017-02-21 DIAGNOSIS — M25521 Pain in right elbow: Secondary | ICD-10-CM | POA: Diagnosis not present

## 2017-02-25 DIAGNOSIS — Z23 Encounter for immunization: Secondary | ICD-10-CM | POA: Diagnosis not present

## 2017-03-09 DIAGNOSIS — R0981 Nasal congestion: Secondary | ICD-10-CM | POA: Diagnosis not present

## 2017-03-09 DIAGNOSIS — E782 Mixed hyperlipidemia: Secondary | ICD-10-CM | POA: Diagnosis not present

## 2017-03-09 DIAGNOSIS — E063 Autoimmune thyroiditis: Secondary | ICD-10-CM | POA: Diagnosis not present

## 2017-05-05 ENCOUNTER — Encounter: Payer: 59 | Attending: Family Medicine | Admitting: Skilled Nursing Facility1

## 2017-05-05 ENCOUNTER — Encounter: Payer: Self-pay | Admitting: Skilled Nursing Facility1

## 2017-05-05 DIAGNOSIS — Z713 Dietary counseling and surveillance: Secondary | ICD-10-CM | POA: Insufficient documentation

## 2017-05-05 DIAGNOSIS — E669 Obesity, unspecified: Secondary | ICD-10-CM

## 2017-05-05 NOTE — Progress Notes (Signed)
  Assessment:  Primary concerns today: obesity.  Pt states she is currently at her highest weight. Pt states she would like to lose weight. Pt states she does struggle with sleep. 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 Pt state she was Worried about breast cancer due to her extreme family hx so she got a double mastectomy which she states she does not regret but she has noticed her stomach sticks out further than her chest now. Pt works part time.  Pt states she is trying to cut out her sleeping medication with her doctors guidance. Pt states he has an onion allergy. Pt states her usual weight was 145 and never used food for coping but did not eat as a coping mechanism so her current emotional eating is a new concept. Pt is working with mental health providers. Pt is talkative.    MEDICATIONS: See List   DIETARY INTAKE:  Usual eating pattern includes 3 meals and 1 snacks per day.  Everyday foods include none stated.  Avoided foods include onion.    24-hr recall:  B ( 8:30 AM): fiber one bar (90 calories) and coffee Snk ( 11 AM): boiled egg (60 cal) L ( 1-4 PM): sliced meat (938 cal) with 130 cal of chips Snk ( PM):  D ( PM):  Snk ( PM): candy: chocolate  Beverages: 3 cups coffee with almond milk, water  Usual physical activity: ADL's  Estimated energy needs: 1500 calories 170 g carbohydrates 112 g protein 42 g fat  Progress Towards Goal(s):  In progress.    Intervention:  Nutrition counseling. Goals: Eat every 3-5 hours starting 1-1.5 hours after waking  Eat well balanced meals using the meal ideas sheet Put together healthy snacks using the snack ideas sheet  Teaching Method Utilized:  Visual Auditory Hands on  Handouts given during visit include:  Meal ideas sheet  Snack ideas  Barriers to learning/adherence to lifestyle change: mental health  Demonstrated  degree of understanding via:  Teach Back   Monitoring/Evaluation:  Dietary intake, exercise, and body weight prn.

## 2017-06-01 ENCOUNTER — Telehealth: Payer: Self-pay | Admitting: Skilled Nursing Facility1

## 2017-06-01 NOTE — Telephone Encounter (Signed)
Pt called stating she has misplaced the handouts given to her during he appointment. Dietitian stated she will mail them.   Dietitian has mailed them.

## 2017-06-02 ENCOUNTER — Ambulatory Visit: Payer: 59 | Admitting: Skilled Nursing Facility1

## 2017-06-03 DIAGNOSIS — L905 Scar conditions and fibrosis of skin: Secondary | ICD-10-CM | POA: Diagnosis not present

## 2017-06-03 DIAGNOSIS — L821 Other seborrheic keratosis: Secondary | ICD-10-CM | POA: Diagnosis not present

## 2017-06-03 DIAGNOSIS — D1801 Hemangioma of skin and subcutaneous tissue: Secondary | ICD-10-CM | POA: Diagnosis not present

## 2017-06-22 ENCOUNTER — Ambulatory Visit: Payer: 59 | Admitting: Skilled Nursing Facility1

## 2017-07-16 ENCOUNTER — Encounter (HOSPITAL_COMMUNITY): Payer: Self-pay | Admitting: Psychiatry

## 2017-07-16 ENCOUNTER — Ambulatory Visit (INDEPENDENT_AMBULATORY_CARE_PROVIDER_SITE_OTHER): Payer: 59 | Admitting: Psychiatry

## 2017-07-16 VITALS — BP 118/80 | HR 100 | Ht 63.0 in | Wt 189.8 lb

## 2017-07-16 DIAGNOSIS — R45851 Suicidal ideations: Secondary | ICD-10-CM

## 2017-07-16 DIAGNOSIS — G4700 Insomnia, unspecified: Secondary | ICD-10-CM

## 2017-07-16 DIAGNOSIS — F4312 Post-traumatic stress disorder, chronic: Secondary | ICD-10-CM

## 2017-07-16 DIAGNOSIS — R450 Nervousness: Secondary | ICD-10-CM | POA: Diagnosis not present

## 2017-07-16 DIAGNOSIS — F319 Bipolar disorder, unspecified: Secondary | ICD-10-CM

## 2017-07-16 MED ORDER — VENLAFAXINE HCL ER 150 MG PO CP24: 300.0000 mg | ORAL_CAPSULE | Freq: Every day | ORAL | 0 refills | Status: DC

## 2017-07-16 MED ORDER — LURASIDONE HCL 20 MG PO TABS: 20.0000 mg | ORAL_TABLET | ORAL | 2 refills | Status: DC

## 2017-07-16 MED ORDER — CLONAZEPAM 1 MG PO TABS: 3.0000 mg | ORAL_TABLET | Freq: Every day | ORAL | 1 refills | Status: DC

## 2017-07-16 MED ORDER — TRAZODONE HCL 100 MG PO TABS: 100.0000 mg | ORAL_TABLET | Freq: Every day | ORAL | 0 refills | Status: DC
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
## 2017-07-27 ENCOUNTER — Telehealth (HOSPITAL_COMMUNITY): Payer: Self-pay

## 2017-07-27 NOTE — Telephone Encounter (Signed)
Her dose of depakote was subtherapeutic and not likely providing any benefit.  She was only taking 1/4 of what she was even prescribed which is why we stopped it.  We can explore other options at our follow up and in the meantime she should definitely be participating in therapy weekly and consider group therapy as well.
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
## 2017-07-28 ENCOUNTER — Other Ambulatory Visit (HOSPITAL_COMMUNITY): Payer: Self-pay

## 2017-07-28 NOTE — Telephone Encounter (Signed)
Called patient and informed of message. Patient understood

## 2017-08-11 DIAGNOSIS — H5212 Myopia, left eye: Secondary | ICD-10-CM | POA: Diagnosis not present

## 2017-08-19 ENCOUNTER — Encounter (HOSPITAL_COMMUNITY): Payer: Self-pay | Admitting: Psychiatry

## 2017-08-19 ENCOUNTER — Ambulatory Visit (HOSPITAL_COMMUNITY): Payer: 59 | Admitting: Psychiatry

## 2017-08-19 VITALS — BP 127/83 | HR 95 | Ht 63.0 in | Wt 186.6 lb

## 2017-08-19 DIAGNOSIS — F4312 Post-traumatic stress disorder, chronic: Secondary | ICD-10-CM

## 2017-08-19 DIAGNOSIS — F319 Bipolar disorder, unspecified: Secondary | ICD-10-CM | POA: Diagnosis not present

## 2017-08-19 MED ORDER — SUMATRIPTAN SUCCINATE 100 MG PO TABS: 100.0000 mg | ORAL_TABLET | Freq: Once | ORAL | 0 refills | Status: DC | PRN

## 2017-08-19 MED ORDER — LURASIDONE HCL 40 MG PO TABS: 40.0000 mg | ORAL_TABLET | Freq: Every day | ORAL | 1 refills | Status: DC
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
## 2017-09-15 ENCOUNTER — Telehealth (HOSPITAL_COMMUNITY): Payer: Self-pay

## 2017-09-15 DIAGNOSIS — F319 Bipolar disorder, unspecified: Secondary | ICD-10-CM

## 2017-09-15 DIAGNOSIS — F4312 Post-traumatic stress disorder, chronic: Secondary | ICD-10-CM

## 2017-09-15 MED ORDER — LURASIDONE HCL 60 MG PO TABS: 60.0000 mg | ORAL_TABLET | Freq: Every day | ORAL | 0 refills | Status: DC

## 2017-09-15 NOTE — Telephone Encounter (Signed)
Patient is calling wanting to know if she can go up on her medication Latuda. Patient would like to increase to 60 mg, please review and advise, thank you

## 2017-09-15 NOTE — Telephone Encounter (Signed)
Yes that is okay, okay to send in the 60 mg dose tablet 30 days

## 2017-10-07 ENCOUNTER — Encounter: Payer: 59 | Attending: Family Medicine | Admitting: Skilled Nursing Facility1

## 2017-10-07 ENCOUNTER — Encounter: Payer: Self-pay | Admitting: Skilled Nursing Facility1

## 2017-10-07 DIAGNOSIS — R7303 Prediabetes: Secondary | ICD-10-CM

## 2017-10-07 DIAGNOSIS — Z713 Dietary counseling and surveillance: Secondary | ICD-10-CM | POA: Insufficient documentation

## 2017-10-07 NOTE — Progress Notes (Signed)
  Assessment:  Primary concerns today: obesity.   Pt is working with mental health providers. Pt is talkative.  Pt states her cholesterol is better but now has an A1C of 6.1. Pt states she does have a family hx of diabetes. Pt states her mental health medications have been changed which has decreased her appetite. Pt states she has been counting calories: less than 1000. Pt states she has had really low energy lately.  Pt was hesitant to increase her caloric intake.    MEDICATIONS: See List   DIETARY INTAKE:  Usual eating pattern includes 3 meals and 1 snacks per day.  Everyday foods include none stated.  Avoided foods include onion.    24-hr recall:  B ( 8:30 AM): fiber one bar (90 calories) and coffee or sometimes with an egg Snk ( 11 AM): boiled egg (60 cal) L ( 1-4 PM): sliced meat (938 cal) with carrots sometimes with humus or lettuce Snk ( PM): applesauce  D ( PM): ham with cauliflower and wheat flatbread  Snk ( PM): cereal with almond milk  Beverages: 3 cups coffee with almond milk, water  Usual physical activity: walking: 45-50 minutes 6 days a week  Estimated energy needs: 1500 calories 170 g carbohydrates 112 g protein 42 g fat  Progress Towards Goal(s):  In progress.    Intervention:  Nutrition counseling. Goals: Eat every 3-5 hours starting 1-1.5 hours after waking  Eat well balanced meals using the meal ideas sheet Put together healthy snacks  Identify your negative thoughts and turn them positive  Aim for 1200-1500 calories a day 15-30 grams of carbohydrate per meal  Teaching Method Utilized:  Visual Auditory Hands on  Handouts given during visit include:  Meal ideas sheet  Snack ideas  Barriers to learning/adherence to lifestyle change: mental health  Demonstrated degree of understanding via:  Teach Back   Monitoring/Evaluation:  Dietary intake, exercise, and body weight prn.

## 2017-10-22 ENCOUNTER — Ambulatory Visit: Payer: Self-pay | Admitting: Psychiatry

## 2017-11-05 ENCOUNTER — Ambulatory Visit: Payer: 59 | Admitting: Psychiatry

## 2017-11-05 ENCOUNTER — Encounter: Payer: Self-pay | Admitting: Psychiatry

## 2017-11-05 DIAGNOSIS — F331 Major depressive disorder, recurrent, moderate: Secondary | ICD-10-CM | POA: Insufficient documentation

## 2017-11-05 DIAGNOSIS — F4310 Post-traumatic stress disorder, unspecified: Secondary | ICD-10-CM
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
## 2017-11-26 ENCOUNTER — Ambulatory Visit: Payer: 59 | Admitting: Psychiatry

## 2017-11-26 DIAGNOSIS — F331 Major depressive disorder, recurrent, moderate: Secondary | ICD-10-CM

## 2017-11-26 DIAGNOSIS — F4310 Post-traumatic stress disorder, unspecified: Secondary | ICD-10-CM

## 2017-11-26 NOTE — Progress Notes (Signed)
      [facility] Counselor/Therapist Progress Note   Patient ID: Diana Ayers, MRN: [id number]  Date: 11/26/2017  Timespent: 50 minutes   Treatment Type: Individual   Reported Symptoms: anxiety, flashbacks   Mental Status Exam:    Appearance:   Well Groomed     Behavior:  Appropriate  Motor:  Normal  Speech/Language:   Normal Rate  Affect:  Appropriate  Mood:  normal  Thought process:  normal  Thought content:    WNL  Sensory/Perceptual disturbances:    WNL  Orientation:  oriented to person, place and time/date  Attention:  Good  Concentration:  Good  Memory:  Immediate;   [place] of knowledge:   Good  Insight:    Good  Judgment:   Good  Impulse Control:  Good     Risk Assessment: Danger to Self:  No Self-injurious Behavior: No Danger to Others: No Duty to Warn:no Physical Aggression / Violence:No  Access to Firearms a concern: No  Gang Involvement:No    Subjective: Patient was present for session.  Patient reported that overall she was doing better.  She had had moments where her PTSD had been triggered.  She shared that her mother is starting to have similar behaviors and health issues as she was a year ago and that is bringing up lots of emotions for her.  Discussed ways for her to take care of herself during this time so that her depression does not increase.  Patient shared she is trying to figure out how to feel of purpose in her life again.  She asked what clinician felt were her strengths and gifts.  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  Brainstormed different possibilities for volunteer work that she might find rewarding and help her to stay more motivated.  Patient agreed to try and start contacting the different options to see what might be a good fit for her at this time.   Interventions:  Solution-Oriented/Positive Psychology   Diagnosis:   ICD-10-CM   1. PTSD (post-traumatic stress disorder) F43.10   2. Major depressive disorder, recurrent episode, moderate (HCC) F33.1      Plan: 1.  Patient to continue to engage in individual counseling 2-4 times a month or as needed. 2.  Patient to identify and apply CBT, coping skills learned in treatment to decrease depression and anxiety symptoms. 3.  Patient to contact this office, go to the local ED or call 911 if a crisis or emergency develops between visits.   [name], [place]

## 2017-12-09 ENCOUNTER — Ambulatory Visit: Payer: 59 | Admitting: Psychiatry

## 2017-12-09 DIAGNOSIS — F331 Major depressive disorder, recurrent, moderate: Secondary | ICD-10-CM | POA: Diagnosis not present

## 2017-12-09 DIAGNOSIS — F4310 Post-traumatic stress disorder, unspecified: Secondary | ICD-10-CM | POA: Diagnosis not present
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
## 2017-12-24 ENCOUNTER — Ambulatory Visit: Payer: 59 | Admitting: Psychiatry

## 2017-12-29 ENCOUNTER — Ambulatory Visit: Payer: 59 | Admitting: Psychiatry

## 2017-12-29 ENCOUNTER — Encounter: Payer: Self-pay | Admitting: Psychiatry

## 2017-12-29 DIAGNOSIS — F331 Major depressive disorder, recurrent, moderate: Secondary | ICD-10-CM

## 2017-12-29 DIAGNOSIS — F4310 Post-traumatic stress disorder, unspecified: Secondary | ICD-10-CM

## 2017-12-29 NOTE — Progress Notes (Signed)
      [facility] Counselor/Therapist Progress Note  Patient ID: Diana Ayers, MRN: [id number],    Date: 12/29/2017  Time Spent: 51 minutes  Treatment Type: Individual Therapy  Reported Symptoms: Depressed mood and Sleep disturbance  Mental Status Exam:  Appearance:   Casual     Behavior:  Appropriate  Motor:  Normal  Speech/Language:   Normal Rate  Affect:  Depressed  Mood:  depressed  Thought process:  circumstantial  Thought content:    WNL  Sensory/Perceptual disturbances:    WNL  Orientation:  oriented to person, place and time/date  Attention:  Good  Concentration:  Good  Memory:  Immediate;   [place] of knowledge:   Good  Insight:    Fair  Judgment:   Good  Impulse Control:  Good   Risk Assessment: Danger to Self:  No Self-injurious Behavior: No Danger to Others: No Duty to Warn:no Physical Aggression / Violence:No  Access to Firearms a concern: No  Gang Involvement:No   Subjective: Patient was present for session.  Patient reported she was struggling with her depression.  Patient was able to share that recently she has had a few triggers that have brought up PTSD symptoms that lead to depression.  Patient was able to acknowledge the holidays and this month are always very difficult for her with the anticipating anniversary death date of her father's death.  Also her mother had another procedure completed and still nothing seems to be known about her medical issues.  Patient explains she is struggling with looking forward to the holidays.  Discussed different ways that she could take care of herself and find some positive things for the holidays.  Patient was encouraged to set limits with her mother since she knows she is a button for her.  Also she is to find positive things and focus on them regularly.  She was able to recognize that she has to continue trying to do the right things even when it is very difficult.  Patient agreed to try and come in for an  earlier appointment if her emotions were to get worse.  Interventions: Solution-Oriented/Positive Psychology  Diagnosis:   ICD-10-CM   1. PTSD (post-traumatic stress disorder) F43.10   2. Major depressive disorder, recurrent episode, moderate (HCC) F33.1     Plan: 1.  Patient to continue to engage in individual counseling 2-4 times a month or as needed. 2.  Patient to identify and apply CBT, coping skills learned in session to decrease depression and triggered responses symptoms. 3.  Patient to contact this office, go to the local ED or call 911 if a crisis or emergency develops between visits.  [name], [place]

## 2018-01-04 ENCOUNTER — Encounter: Payer: Self-pay | Admitting: Psychiatry

## 2018-01-04 ENCOUNTER — Ambulatory Visit: Payer: 59 | Admitting: Psychiatry

## 2018-01-04 DIAGNOSIS — F331 Major depressive disorder, recurrent, moderate: Secondary | ICD-10-CM

## 2018-01-04 DIAGNOSIS — F4310 Post-traumatic stress disorder, unspecified: Secondary | ICD-10-CM

## 2018-01-04 NOTE — Progress Notes (Signed)
      [facility] Counselor/Therapist Progress Note  Patient ID: Diana Ayers, MRN: [id number],    Date: 01/04/2018  Time Spent: 49 minutes  Treatment Type: Individual Therapy  Reported Symptoms: Depressed mood and Fatigue  Mental Status Exam:  Appearance:   Well Groomed     Behavior:  Sharing  Motor:  Normal  Speech/Language:   Normal Rate  Affect:  Congruent  Mood:  sad  Thought process:  circumstantial  Thought content:    WNL  Sensory/Perceptual disturbances:    WNL  Orientation:  oriented to person, place and time/date  Attention:  Good  Concentration:  Good  Memory:  Immediate;   [place] of knowledge:   Good  Insight:    Good  Judgment:   Good  Impulse Control:  Good   Risk Assessment: Danger to Self:  No Self-injurious Behavior: No Danger to Others: No Duty to Warn:no Physical Aggression / Violence:No  Access to Firearms a concern: No  Gang Involvement:No   Subjective: Patient was present for session.  She reported she was trying to figure out how to get through the holidays with the least amount of depression.  Patient shared that her mother has already made changes to what is supposed to occur when she is trying to figure out how to deal with the situation.  Patient was allowed time to discuss the holidays and figure out what are realistic expectations for her family situation.  Patient was encouraged to put some safety notes in place and find ways to utilize her coping skills since she will be with them an extended amount of time.  Patient was able to come up with some plans that she felt would be helpful for her.  Patient also wanted to discuss how to celebrate her father over the holiday even when the rest of her family struggles with even talking about his death.  Patient was able to come up with a plan she felt she could follow through on that issue as well.  Interventions: Solution-Oriented/Positive Psychology  Diagnosis:   ICD-10-CM   1. Major  depressive disorder, recurrent episode, moderate (HCC) F33.1   2. PTSD (post-traumatic stress disorder) F43.10     Plan: 1.  Patient to continue to engage in individual counseling 2-4 times a month or as needed. 2.  Patient to identify and apply coping skills learned in session to decrease depression and anxiety symptoms. 3.  Patient to contact this office, go to the local ED or call 911 if a crisis or emergency develops between visits.  [name], [place]

## 2018-01-22 ENCOUNTER — Encounter: Payer: Self-pay | Admitting: Psychiatry

## 2018-01-22 ENCOUNTER — Ambulatory Visit: Payer: 59 | Admitting: Psychiatry

## 2018-01-22 DIAGNOSIS — F4310 Post-traumatic stress disorder, unspecified: Secondary | ICD-10-CM

## 2018-01-22 DIAGNOSIS — F331 Major depressive disorder, recurrent, moderate: Secondary | ICD-10-CM | POA: Diagnosis not present

## 2018-01-22 NOTE — Progress Notes (Signed)
      [facility] Counselor/Therapist Progress Note  Patient ID: Diana Ayers, MRN: [id number],    Date: 01/22/2018  Time Spent: 50 minutes  Treatment Type: Individual Therapy  Reported Symptoms: sadness, triggered reponses  Mental Status Exam:  Appearance:   Casual     Behavior:  Sharing  Motor:  Normal  Speech/Language:   Normal Rate  Affect:  Congruent  Mood:  normal  Thought process:  normal  Thought content:    WNL  Sensory/Perceptual disturbances:    WNL  Orientation:  oriented to person, place and time/date  Attention:  Good  Concentration:  Good  Memory:  Immediate;   [place] of knowledge:   Good  Insight:    Good  Judgment:   Good  Impulse Control:  Good   Risk Assessment: Danger to Self:  No Self-injurious Behavior: No Danger to Others: No Duty to Warn:no Physical Aggression / Violence:No  Access to Firearms a concern: No  Gang Involvement:No   Subjective: Patient was present for session.  Patient reported that she had follow-through on plans for the holidays and they had gone very well.  She is also moving in the direction of being able to volunteer soon and that is a positive for her.  Patient is also starting to think about returning to work and getting more structure in her life.  She reported that things are going okay with her mom and she is starting to accept them for what they are rather than hoping that things become different.  Patient was encouraged to continue focusing on her positive self talk and looking for different outlets for her emotions that are appropriate.  Patient had some questions about recent triggers concerning death, discussed how most of what she was saying is normal for her situation.  Different ways to talk herself through those emotions were discussed with patient and plans developed.  Interventions: Solution-Oriented/Positive Psychology  Diagnosis:   ICD-10-CM   1. PTSD (post-traumatic stress disorder) F43.10   2.  Major depressive disorder, recurrent episode, moderate (HCC) F33.1     Plan: 1.  Patient to continue to engage in individual counseling 2-4 times a month or as needed. 2.  Patient to identify and apply  coping skills learned in session to decrease depression and anxiety symptoms. 3.  Patient to contact this office, go to the local ED or call 911 if a crisis or emergency develops between visits.  [name], [place]

## 2018-02-03 ENCOUNTER — Ambulatory Visit: Payer: 59 | Admitting: Psychiatry

## 2018-02-03 ENCOUNTER — Encounter: Payer: Self-pay | Admitting: Psychiatry

## 2018-02-03 DIAGNOSIS — F331 Major depressive disorder, recurrent, moderate: Secondary | ICD-10-CM | POA: Diagnosis not present

## 2018-02-03 DIAGNOSIS — F4310 Post-traumatic stress disorder, unspecified: Secondary | ICD-10-CM
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
## 2018-02-16 ENCOUNTER — Ambulatory Visit: Payer: 59 | Admitting: Psychiatry

## 2018-02-19 ENCOUNTER — Ambulatory Visit: Payer: 59 | Admitting: Psychiatry

## 2018-02-19 ENCOUNTER — Encounter: Payer: Self-pay | Admitting: Psychiatry

## 2018-02-19 DIAGNOSIS — F4310 Post-traumatic stress disorder, unspecified: Secondary | ICD-10-CM | POA: Diagnosis not present

## 2018-02-19 DIAGNOSIS — F331 Major depressive disorder, recurrent, moderate: Secondary | ICD-10-CM

## 2018-02-19 NOTE — Progress Notes (Signed)
      [facility] Counselor/Therapist Progress Note  Patient ID: Diana Ayers, MRN: [id number],    Date: 02/19/2018  Time Spent: 52 minutes  Treatment Type: Individual Therapy  Reported Symptoms: Depressed mood, Anxious Mood and Sleep disturbance Irritability Mental Status Exam:  Appearance:   Well Groomed     Behavior:  Appropriate  Motor:  Normal  Speech/Language:   Normal Rate  Affect:  Congruent  Mood:  normal  Thought process:  normal  Thought content:    WNL  Sensory/Perceptual disturbances:    WNL  Orientation:  oriented to person, place and time/date  Attention:  Good  Concentration:  Good  Memory:  Immediate;   [place] of knowledge:   Good  Insight:    Good  Judgment:   Good  Impulse Control:  Good   Risk Assessment: Danger to Self:  No Self-injurious Behavior: No Danger to Others: No Duty to Warn:no Physical Aggression / Violence:No  Access to Firearms a concern: No  Gang Involvement:No   Subjective: Patient was present for session.  She reported that she has been decreasing medication and is having some sleep issues and increased anxiety.  Discussed treatment plan with pt.  Pt asked about how to handle the issues with sleep.  Pt reported last suggestion of thinking of things she is thankful for was helping.  Encouraged patient to continue practicing that as well as working on her schedule diet and exercise.  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  She agreed to stop doing that.  Patient went on to explain issues with her family discussed what was going on and why she felt things were personal.  The importance of using CBT filters to help her not take things personal were discussed with patient.  Patient was also encouraged to start running out where she notices the tension in her body with the situations as well as the negative  cognitions that seem to surface.  Those issues will be processed through at next session.     Interventions: Cognitive Behavioral Therapy and Solution-Oriented/Positive Psychology  Diagnosis:   ICD-10-CM   1. PTSD (post-traumatic stress disorder) F43.10   2. Major depressive disorder, recurrent episode, moderate (HCC) F33.1     Plan: 1.  Patient to continue to engage in individual counseling 2-4 times a month or as needed. 2.  Patient to identify and apply CBT, coping skills learned in session to decrease depression and anxiety symptoms. 3.  Patient to contact this office, go to the local ED or call 911 if a crisis or emergency develops between visits.  [name], [place]

## 2018-03-02 ENCOUNTER — Ambulatory Visit: Payer: 59 | Admitting: Psychiatry

## 2018-03-03 ENCOUNTER — Ambulatory Visit: Payer: 59 | Admitting: Psychiatry

## 2018-03-16 ENCOUNTER — Ambulatory Visit: Payer: 59 | Admitting: Psychiatry

## 2018-03-16 ENCOUNTER — Encounter: Payer: Self-pay | Admitting: Psychiatry

## 2018-03-16 DIAGNOSIS — F331 Major depressive disorder, recurrent, moderate: Secondary | ICD-10-CM | POA: Diagnosis not present

## 2018-03-16 DIAGNOSIS — F4310 Post-traumatic stress disorder, unspecified: Secondary | ICD-10-CM
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
## 2018-04-09 ENCOUNTER — Ambulatory Visit: Payer: 59 | Admitting: Psychiatry

## 2018-04-14 ENCOUNTER — Encounter: Payer: Self-pay | Admitting: Psychiatry

## 2018-04-14 ENCOUNTER — Ambulatory Visit (INDEPENDENT_AMBULATORY_CARE_PROVIDER_SITE_OTHER): Payer: 59 | Admitting: Psychiatry

## 2018-04-14 ENCOUNTER — Other Ambulatory Visit: Payer: Self-pay

## 2018-04-14 DIAGNOSIS — F4310 Post-traumatic stress disorder, unspecified: Secondary | ICD-10-CM

## 2018-04-14 DIAGNOSIS — F331 Major depressive disorder, recurrent, moderate: Secondary | ICD-10-CM

## 2018-04-14 NOTE — Progress Notes (Signed)
[facility] Counselor/Therapist Progress Note  Patient ID: Diana Ayers, MRN: [id number],    Date: 04/14/2018  Time Spent: 52 minutes   Start time 1:02  End time 1:54 Patient was at home and I was at the office  Virtual Visit via Telephone Note I connected with patient by a video enabled telemedicine application or telephone, with their informed consent, and verified patient privacy and that I am speaking with the correct person using two identifiers.    I discussed the limitations, risks, security and privacy concerns of performing psychotherapy and management service by telephone and the availability of in person appointments. I also discussed with the patient that there may be a patient responsible charge related to this service. The patient expressed understanding and agreed to proceed.  I discussed the treatment planning with the patient. The patient was provided an opportunity to ask questions and all were answered. The patient agreed with the plan and demonstrated an understanding of the instructions.   The patient was advised to call  our office if  symptoms worsen or feel they are in a crisis state and need immediate contact.  Treatment Type: Individual Therapy  Reported Symptoms: anxiety, hypervigilance, vivid dreaming of police time, irritability, sadness    Mental Status Exam:  Appearance:   NA     Behavior:  Sharing  Motor:  na  Speech/Language:   Normal Rate  Affect:  NA  Mood:  anxious  Thought process:  normal  Thought content:    Rumination  Sensory/Perceptual disturbances:    WNL  Orientation:  oriented to person, place and time/date  Attention:  Good  Concentration:  Good  Memory:  Immediate;   [place] of knowledge:   Good  Insight:    Good  Judgment:   Good  Impulse Control:  Good   Risk Assessment: Danger to Self:  No Self-injurious Behavior: No Danger to Others: No Duty to Warn:no Physical Aggression / Violence:No  Access to  Firearms a concern: No  Gang Involvement:No   Subjective: Patient reported that she was trying to deal with the situation.  She explained that she and her roommate were having some bumps with the extra amount of time together currently.  She also explained that currently she is on her self quarantine due to potential exposure she was working.  Patient shared she is struggling with not being able connect with others and is hopeful things will go back to normal soon.  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  Encouraged patient to utilize her tools and to remind herself  of the truths concerning the situation and herself.  The importance of not letting her thoughts go in negative" what if" directions was discussed and pt was encouraged to use CBT skills to help her redirect her thoughts.  Pt reported that her uncle is sicker and now they can't go see him.  Reminded her she gave her mother options to go that she did not act on so she does not have to feel guilt over allowing her mother her choices.  Talked to pt about trying a virtual session, she was not interested at this time and asked for a phone call again at next session.  Interventions: Cognitive Behavioral Therapy and Solution-Oriented/Positive Psychology  Diagnosis:   ICD-10-CM   1. PTSD (post-traumatic stress disorder) F43.10   2. Major depressive disorder, recurrent  episode, moderate (HCC) F33.1     Plan: 1.  Patient to continue to engage in individual counseling 2-4 times a month or as needed. 2.  Patient to identify and apply CBT, coping skills learned in session to decrease depression and anxiety symptoms. 3.  Patient to contact this office, go to the local ED or call 911 if a crisis or emergency develops between visits.  [name], [place]    This record has been created using [employer].  Chart creation errors have been sought, but may not always  have been located and corrected. Such creation errors do not reflect on the standard of medical care.

## 2018-04-22 ENCOUNTER — Encounter: Payer: Self-pay | Admitting: Psychiatry

## 2018-04-22 ENCOUNTER — Ambulatory Visit (INDEPENDENT_AMBULATORY_CARE_PROVIDER_SITE_OTHER): Payer: 59 | Admitting: Psychiatry

## 2018-04-22 DIAGNOSIS — F331 Major depressive disorder, recurrent, moderate: Secondary | ICD-10-CM

## 2018-04-22 DIAGNOSIS — F4310 Post-traumatic stress disorder, unspecified: Secondary | ICD-10-CM | POA: Diagnosis not present

## 2018-04-22 NOTE — Progress Notes (Signed)
      [facility] Counselor/Therapist Progress Note  Patient ID: Diana Ayers, MRN: [id number],    Date: 04/22/2018  Time Spent: 52 minutes Start time 11:02 a.m. and time 11:54 AM  I connected with patient by a video enabled telemedicine application or telephone, with their informed consent, and verified patient privacy and that I am speaking with the correct person using two identifiers.  I was located at home and patient at her home.  Treatment Type: Individual Therapy  Reported Symptoms: anxiety, sadness, sleep issues, nightmares, low motivation  Mental Status Exam:  Appearance:   NA     Behavior:  Sharing  Motor:  NA  Speech/Language:   Normal Rate  Affect:  NA  Mood:  normal  Thought process:  normal  Thought content:    WNL  Sensory/Perceptual disturbances:    WNL  Orientation:  oriented to person, place, time/date and situation  Attention:  Good  Concentration:  Good  Memory:  Immediate;   [place] of knowledge:   Good  Insight:    Good  Judgment:   Good  Impulse Control:  Good   Risk Assessment: Danger to Self:  No Self-injurious Behavior: No Danger to Others: No Duty to Warn:no Physical Aggression / Violence:No  Access to Firearms a concern: No  Gang Involvement:No   Subjective: Met with patient by phone.  She reported she is doing a little better.  But still very frustrated with having to be in her home.  She shared it is starting to create issues between she and her roommate.  Her roommate is feeling troll over things so they are arguing more which is very disturbing for patient.  Discussed different ways that she can filter what her roommate says so that she does not get triggered or is agitated.  The importance of sharing her concerns with her roommate were addressed as well.  Patient agreed to work on trying to hear her roommate as well as making sure her roommate hears her doing exercise discussed with patient in session.  Patient also reported she  is trying to figure out how to handle things with her mother discussed different options and it was agreed at this point the best thing she can do is just maintain her distance.  Patient was reminded the importance of keeping her thoughts engaged in positive things continues to work on her CBT skills.  Discussed different options she has to be able to do that at home.  Agreed to work on plans developed in session.  Interventions: Cognitive Behavioral Therapy and Solution-Oriented/Positive Psychology  Diagnosis:   ICD-10-CM   1. PTSD (post-traumatic stress disorder) F43.10   2. Major depressive disorder, recurrent episode, moderate (HCC) F33.1     Plan: 1.  Patient to continue to engage in individual counseling 2-4 times a month or as needed. 2.  Patient to identify and apply CBT, coping skills learned in session to decrease depression and anxiety symptoms. 3.  Patient to contact this office, go to the local ED or call 911 if a crisis or emergency develops between visits.  [name], [place]   This record has been created using [employer].  Chart creation errors have been sought, but may not always have been located and corrected. Such creation errors do not reflect on the standard of medical care.

## 2018-05-06 ENCOUNTER — Ambulatory Visit (INDEPENDENT_AMBULATORY_CARE_PROVIDER_SITE_OTHER): Payer: 59 | Admitting: Psychiatry

## 2018-05-06 ENCOUNTER — Other Ambulatory Visit: Payer: Self-pay

## 2018-05-06 ENCOUNTER — Encounter: Payer: Self-pay | Admitting: Psychiatry

## 2018-05-06 DIAGNOSIS — F4310 Post-traumatic stress disorder, unspecified: Secondary | ICD-10-CM

## 2018-05-06 DIAGNOSIS — F331 Major depressive disorder, recurrent, moderate: Secondary | ICD-10-CM | POA: Diagnosis not present

## 2018-05-06 NOTE — Progress Notes (Signed)
[facility] Counselor/Therapist Progress Note  Patient ID: Diana Ayers, MRN: [id number],    Date: 05/06/2018  Time Spent: 52 minutes Start time 11:01 a.m. end time 11:53 AM Virtual Visit via Telephone Note I connected with patient by a video enabled telemedicine application or telephone, with their informed consent, and verified patient privacy and that I am speaking with the correct person using two identifiers. I discussed the limitations, risks, security and privacy concerns of performing psychotherapy and management service by telephone and the availability of in person appointments. I also discussed with the patient that there may be a patient responsible charge related to this service. The patient expressed understanding and agreed to proceed. I discussed the treatment planning with the patient. The patient was provided an opportunity to ask questions and all were answered. The patient agreed with the plan and demonstrated an understanding of the instructions. The patient was advised to call  our office if  symptoms worsen or feel they are in a crisis state and need immediate contact. Patient was at home and clinician was at home  Treatment Type: Individual Therapy  Reported Symptoms: hopeless, depressed, anxious, triggered responses, low motivation, sleep issues  Mental Status Exam:  Appearance:   NA     Behavior:  Sharing  Motor:  NA  Speech/Language:   Normal Rate  Affect:  NA  Mood:  sad  Thought process:  normal  Thought content:    WNL  Sensory/Perceptual disturbances:    WNL  Orientation:  oriented to person, place, time/date and situation  Attention:  Good  Concentration:  Good  Memory:  Immediate;   [place] of knowledge:   Good  Insight:    Good  Judgment:   Good  Impulse Control:  Good   Risk Assessment: Danger to Self:  No Self-injurious Behavior: No Danger to Others: No Duty to Warn:no Physical Aggression / Violence:No  Access to Firearms  a concern: No  Gang Involvement:No   Subjective: Met with patient via phone.  Patient explained she is struggling with having to stay inside.  It is made her more aware of her limited support group and how alone she can feel.  Patient explained that she lost her first friend to COVID-19.  She also has had another friend who has potential exposure though she cannot be around that friend either.  Patient went on to explain she is having difficulty getting going and sleeping and finding a purpose in doing anything.  Discussed the importance of using CBT skills and reminding herself of the truth.  She has to keep reminding herself that this is temporary and not forever also that people do want a get in touch with her but the situation is making it difficult.  Patient was encouraged to write out a wish list of things that she could do at her home and then the supplies that she would need so she could make 1 trip to the store.  From that list she is to take on 1 thing each day and not to give herself the option to stay in bed.  Patient was also encouraged to go for walks in her neighborhood that that will give her some social contact as well.  Patient is also to about things she would like to do when this is all over and people she can get in touch with at that time.  And agreed to try and focus her thoughts in a more positive direction.  Interventions: Cognitive Behavioral Therapy and Solution-Oriented/Positive Psychology  Diagnosis:   ICD-10-CM   1. Major depressive disorder, recurrent episode, moderate (HCC) F33.1   2. PTSD (post-traumatic stress disorder) F43.10     Plan: 1.  Patient to continue to engage in individual counseling 2-4 times a month or as needed. 2.  Patient to identify and apply CBT, coping skills learned in session to decrease depression and anxiety symptoms. 3.  Patient to contact this office, go to the local ED or call 911 if a crisis or emergency develops between visits.  [name], [facility]    This record has been created using [employer].  Chart creation errors have been sought, but may not always have been located and corrected. Such creation errors do not reflect on the standard of medical care.

## 2018-05-19 ENCOUNTER — Ambulatory Visit: Payer: 59 | Admitting: Psychiatry

## 2018-05-20 ENCOUNTER — Ambulatory Visit (INDEPENDENT_AMBULATORY_CARE_PROVIDER_SITE_OTHER): Payer: 59 | Admitting: Psychiatry

## 2018-05-20 ENCOUNTER — Other Ambulatory Visit: Payer: Self-pay

## 2018-05-20 ENCOUNTER — Encounter: Payer: Self-pay | Admitting: Psychiatry

## 2018-05-20 DIAGNOSIS — F331 Major depressive disorder, recurrent, moderate: Secondary | ICD-10-CM

## 2018-05-20 DIAGNOSIS — F4310 Post-traumatic stress disorder, unspecified: Secondary | ICD-10-CM | POA: Diagnosis not present

## 2018-05-20 NOTE — Progress Notes (Signed)
[facility] Counselor/Therapist Progress Note  Patient ID: Diana Ayers, MRN: [id number],    Date: 05/20/2018  Time Spent: 52 minutes start time 11:01 AM end time 11:53 AM Virtual Visit via Telephone Note Connected with patient by a video enabled telemedicine/telehealth application or telephone, with their informed consent, and verified patient privacy and that I am speaking with the correct person using two identifiers. I discussed the limitations, risks, security and privacy concerns of performing psychotherapy and management service by telephone and the availability of in person appointments. I also discussed with the patient that there may be a patient responsible charge related to this service. The patient expressed understanding and agreed to proceed. I discussed the treatment planning with the patient. The patient was provided an opportunity to ask questions and all were answered. The patient agreed with the plan and demonstrated an understanding of the instructions. The patient was advised to call  our office if  symptoms worsen or feel they are in a crisis state and need immediate contact.  Therapist Location: home Patient Location: home    Treatment Type: Individual Therapy  Reported Symptoms: depression, anger, triggered responses, anxiety  Mental Status Exam:  Appearance:   NA     Behavior:  Sharing  Motor:  NA  Speech/Language:   Normal Rate  Affect:  NA  Mood:  sad  Thought process:  normal  Thought content:    Rumination  Sensory/Perceptual disturbances:    WNL  Orientation:  oriented to person, place, time/date and situation  Attention:  Good  Concentration:  Good  Memory:  Immediate;   [place] of knowledge:   Good  Insight:    Good  Judgment:   Good  Impulse Control:  Good   Risk Assessment: Danger to Self:  No Self-injurious Behavior: No Danger to Others: No Duty to Warn:no Physical Aggression / Violence:No  Access to Firearms a concern:  No  Gang Involvement:No   Subjective: Met with patient via  Phone.  He shared she is having lots of anger and frustration currently.  She reported she recognizes that much of it is situational because she is very trapped inside her home and cannot seem to do the things that she would like to do.  Patient explained that her mother has started having some sickness and she is not wanting to have any assistance which is very difficult for patient but she is trying to just give her the room that she is demanding she has.  Patient was encouraged to try to use her CBT filters to keep it from being personal and recognize that it is more about her mom that it is about her not being enough.  Patient also reported that she is struggling with trusting people and their judgment.  She recognizes that her PTSD is behind much of her issues.  Discussed the importance of using her self talk to help manage any emotions that are triggered by the situation that we are currently involved.  Also encouraged her to try and have patience with her roommate, coping feels to help her do that were discussed with patient.  Interventions: Solution-Oriented/Positive Psychology,CBT  Diagnosis:   ICD-10-CM   1. PTSD (post-traumatic stress disorder) F43.10   2. Major depressive disorder, recurrent episode, moderate (HCC) F33.1     Plan: 1.  Patient to continue to engage in individual counseling 2-4 times a month or as needed. 2.  Patient to identify and apply CBT, coping skills learned  in session to decrease depression and anxiety symptoms. 3.  Patient to contact this office, go to the local ED or call 911 if a crisis or emergency develops between visits.   , [facility]   This record has been created using Dragon software.  Chart creation errors have been sought, but may not always have been located and corrected. Such creation errors do not reflect on the standard of medical care.               

## 2018-05-28 ENCOUNTER — Other Ambulatory Visit: Payer: Self-pay

## 2018-05-28 ENCOUNTER — Ambulatory Visit (INDEPENDENT_AMBULATORY_CARE_PROVIDER_SITE_OTHER): Payer: 59 | Admitting: Psychiatry

## 2018-05-28 DIAGNOSIS — F4310 Post-traumatic stress disorder, unspecified: Secondary | ICD-10-CM

## 2018-05-28 DIAGNOSIS — F331 Major depressive disorder, recurrent, moderate: Secondary | ICD-10-CM | POA: Diagnosis not present

## 2018-05-28 NOTE — Progress Notes (Signed)
[facility] Counselor/Therapist Progress Note  Patient ID: Diana Ayers, MRN: [id number],    Date: 05/28/2018  Time Spent: 52 minutes start time 11:00 a.m. end time 11:52 AM Virtual Visit via Telephone Note Connected with patient by a video enabled telemedicine/telehealth application or telephone, with their informed consent, and verified patient privacy and that I am speaking with the correct person using two identifiers. I discussed the limitations, risks, security and privacy concerns of performing psychotherapy and management service by telephone and the availability of in person appointments. I also discussed with the patient that there may be a patient responsible charge related to this service. The patient expressed understanding and agreed to proceed. I discussed the treatment planning with the patient. The patient was provided an opportunity to ask questions and all were answered. The patient agreed with the plan and demonstrated an understanding of the instructions. The patient was advised to call  our office if  symptoms worsen or feel they are in a crisis state and need immediate contact.   Therapist Location: home Patient Location: home     Treatment Type: Individual Therapy  Reported Symptoms: depression, anger, stressed  Mental Status Exam:  Appearance:   NA     Behavior:  Sharing  Motor:  na  Speech/Language:   Normal Rate  Affect:  NA  Mood:  sad  Thought process:  normal  Thought content:    WNL  Sensory/Perceptual disturbances:    WNL  Orientation:  oriented to person, place, time/date and situation  Attention:  Good  Concentration:  Good  Memory:  Immediate;   [place] of knowledge:   Good  Insight:    Fair  Judgment:   Good  Impulse Control:  Good   Risk Assessment: Danger to Self:  No Self-injurious Behavior: No Danger to Others: No Duty to Warn:no Physical Aggression / Violence:No  Access to Firearms a concern: No  Gang  Involvement:No   Subjective: Met with patient via phone.  She reported that she and her roommate had another argument.  She reported she is working really hard at removing herself when she feels very agitated but her roommate wants to continue the conversation.  Patient was encouraged to continue doing her coping skills including deep breathing and counting and removing herself when she gets agitated even though that is difficult for her roommate that is the best option to keep her at a good place.  Patient also shared that she is going away for the weekend and a few extra days to spend some time with her mother and her family.  Patient explained that her mother got very sick and they are all very worried about her and want to spend time with her while they can.  Patient reported she knows that her mother will trigger her and wants to have a positive plan so that things can go as well as possible.  Discussed all the possible triggers that seem to occur when she is with her mother and develop different solutions to help her manage her emotions appropriately when they occur.  Patient was also reminded of the CBT skills that she has and can utilize any time to keep herself grounded and at a positive place.  Patient agreed to follow through with plans developed in session to manage her emotions appropriately over the next few weeks.  Interventions: Solution-Oriented/Positive Psychology, CBT  Diagnosis:   ICD-10-CM   1. Major depressive disorder, recurrent episode, moderate (HCC) F33.1  2. PTSD (post-traumatic stress disorder) F43.10     Plan: 1.  Patient to continue to engage in individual counseling 2-4 times a month or as needed. 2.  Patient to identify and apply CBT, coping skills learned in session to decrease depression and anxiety symptoms. 3.  Patient to contact this office, go to the local ED or call 911 if a crisis or emergency develops between visits.  [name], [facility]   This record has  been created using [employer].  Chart creation errors have been sought, but may not always have been located and corrected. Such creation errors do not reflect on the standard of medical care.

## 2018-05-30 ENCOUNTER — Encounter: Payer: Self-pay | Admitting: Psychiatry

## 2018-06-02 ENCOUNTER — Ambulatory Visit (INDEPENDENT_AMBULATORY_CARE_PROVIDER_SITE_OTHER): Payer: 59 | Admitting: Psychiatry

## 2018-06-02 ENCOUNTER — Other Ambulatory Visit: Payer: Self-pay

## 2018-06-02 DIAGNOSIS — F4310 Post-traumatic stress disorder, unspecified: Secondary | ICD-10-CM

## 2018-06-02 DIAGNOSIS — F331 Major depressive disorder, recurrent, moderate: Secondary | ICD-10-CM | POA: Diagnosis not present

## 2018-06-02 NOTE — Progress Notes (Signed)
[facility] Counselor/Therapist Progress Note  Patient ID: Diana Ayers, MRN: [id number],    Date: 06/02/2018  Time Spent: 52 minutes start time 11:03 AM End time 1155 a.m. Virtual Visit via Telephone Note Connected with patient by a video enabled telemedicine/telehealth application or telephone, with their informed consent, and verified patient privacy and that I am speaking with the correct person using two identifiers. I discussed the limitations, risks, security and privacy concerns of performing psychotherapy and management service by telephone and the availability of in person appointments. I also discussed with the patient that there may be a patient responsible charge related to this service. The patient expressed understanding and agreed to proceed. I discussed the treatment planning with the patient. The patient was provided an opportunity to ask questions and all were answered. The patient agreed with the plan and demonstrated an understanding of the instructions. The patient was advised to call  our office if  symptoms worsen or feel they are in a crisis state and need immediate contact.   Therapist Location: office Patient Location: mother's home    Treatment Type: Individual Therapy  Reported Symptoms: frustration, anxiety, triggered responses, sadness  Mental Status Exam:  Appearance:   NA     Behavior:  Sharing  Motor:  NA  Speech/Language:   Normal Rate  Affect:  NA  Mood:  frustrated  Thought process:  normal  Thought content:    Rumination  Sensory/Perceptual disturbances:    WNL  Orientation:  oriented to person, place, time/date and situation  Attention:  Good  Concentration:  Good  Memory:  Immediate;   [place] of knowledge:   Good  Insight:    Good  Judgment:   Good  Impulse Control:  Good   Risk Assessment: Danger to Self:  No Self-injurious Behavior: No Danger to Others: No Duty to Warn:no Physical Aggression / Violence:No  Access  to Firearms a concern: No  Gang Involvement:No   Subjective: Met with patient via phone.  Patient reported that she was currently at her mother's home.  She shared she had planned on being back at her house for now but there have been lots of difficult situations that have caused her not to complete her Mother's Day present of the steps so she is still presently at her mother's time.  She went on to share that her mother had to go to the emergency room after the first night she got there.  Patient explained it was very difficult and there and had to wait and the parking lot for several hours while her mother was cleared to get to go home.  Patient went on to explain her youngest sister decided not to participate in the Mother's Day gathering at the park.  Patient explained that he decided to have it outside so it would be easy to do have social distancing and still be able to celebrate their mom.  Patient shared she confronted her sister on her choices and how it was not right for her to have everyone gather around her kids, but when it is something further mother she does not participate.  Patient explained her mother has wants her to be with her more but is not willing to go outside with patient while she works on her steps as she promised.  Patient stated she has steps done and now she has to get the railings.completed and she is trying to figure out how to do that appropriately so they  will be starting.  Patient stated she gets very frustrated and agitated and triggered by her mother.  Discussed different CBT skills to help her manage her emotions and her triggers appropriately.  Patient was also encouraged to try and balance spending small bits of time with her mother and then going outside and finishing her project so that she can get back home as soon as possible.  Interventions: Cognitive Behavioral Therapy and Solution-Oriented/Positive Psychology  Diagnosis:   ICD-10-CM   1. Major depressive  disorder, recurrent episode, moderate (HCC) F33.1   2. PTSD (post-traumatic stress disorder) F43.10     Plan: 1.  Patient to continue to engage in individual counseling 2-4 times a month or as needed. 2.  Patient to identify and apply CBT, coping skills learned in session to decrease depression and anxiety symptoms. 3.  Patient to contact this office, go to the local ED or call 911 if a crisis or emergency develops between visits.  [name], [facility]   This record has been created using [employer].  Chart creation errors have been sought, but may not always have been located and corrected. Such creation errors do not reflect on the standard of medical care.

## 2018-06-04 ENCOUNTER — Encounter: Payer: Self-pay | Admitting: Psychiatry

## 2018-06-10 ENCOUNTER — Ambulatory Visit (INDEPENDENT_AMBULATORY_CARE_PROVIDER_SITE_OTHER): Payer: 59 | Admitting: Psychiatry

## 2018-06-10 ENCOUNTER — Other Ambulatory Visit: Payer: Self-pay

## 2018-06-10 DIAGNOSIS — F331 Major depressive disorder, recurrent, moderate: Secondary | ICD-10-CM

## 2018-06-10 DIAGNOSIS — F4310 Post-traumatic stress disorder, unspecified: Secondary | ICD-10-CM

## 2018-06-10 NOTE — Progress Notes (Signed)
[facility] Counselor/Therapist Progress Note  Patient ID: Diana Ayers, MRN: [id number],    Date: 06/12/2018  Time Spent: 52 minutes start time 11:01 a.m. end time 11:53 AM Virtual Visit via Telephone Note Connected with patient by a video enabled telemedicine/telehealth application or telephone, with their informed consent, and verified patient privacy and that I am speaking with the correct person using two identifiers. I discussed the limitations, risks, security and privacy concerns of performing psychotherapy and management service by telephone and the availability of in person appointments. I also discussed with the patient that there may be a patient responsible charge related to this service. The patient expressed understanding and agreed to proceed. I discussed the treatment planning with the patient. The patient was provided an opportunity to ask questions and all were answered. The patient agreed with the plan and demonstrated an understanding of the instructions. The patient was advised to call  our office if  symptoms worsen or feel they are in a crisis state and need immediate contact.   Therapist Location: home Patient Location: home    Treatment Type: Individual Therapy  Reported Symptoms: triggered responses, sadness, sleep issues, irritability  Mental Status Exam:  Appearance:   NA     Behavior:  Sharing  Motor:  NA  Speech/Language:   Normal Rate  Affect:  NA  Mood:  normal  Thought process:  normal  Thought content:    WNL  Sensory/Perceptual disturbances:    WNL  Orientation:  oriented to person, place, time/date and situation  Attention:  Good  Concentration:  Good  Memory:  WNL  Fund of knowledge:   Good  Insight:    Good  Judgment:   Good  Impulse Control:  Good   Risk Assessment: Danger to Self:  No Self-injurious Behavior: No Danger to Others: No Duty to Warn:no Physical Aggression / Violence:No  Access to Firearms a concern: No   Gang Involvement:No   Subjective: Met with patient via phone.  Patient reported she had finally gotten home after an 8-day visit with her mother.  She reported that she was there she and her mother had some conversations which were positive but both got very triggered during the visit.  Patient explained that her mother told her she was very irritable and patient reported she tried to listen and figure out what was going on.  Patient shared she was able to recognize different triggers that seem to hit when she interacts with her mother and tried to talk to her mother about things that she needed.  Patient was able to report that her mother tried to follow-up some on what patient communicated with her.  Patient was encouraged to feel good about that and to recognize that her mother loves her even though she is unable to be who she needs her to be and has not ever been able to be the mother she deserves.  Patient reported starting to recognize that and wanting to have more of a relationship with her mother but knows she has to do it in small steps so she does not get overwhelmed.  Patient also shared that being able to make her mother steps gave her a purpose and she is wanting to figure out how to have a purpose in this very difficult situation.  Discussed different options that patient had to try and reach out to other people and safe ways or to even look for some work and safe ways.  Patient reported  feeling positive about plans and agreed to try and follow through with them over the next week.  Patient also agreed to take some time and do some things that are relaxing for her like her fishing and trying to do some more exercising.  Interventions: Cognitive Behavioral Therapy and Solution-Oriented/Positive Psychology  Diagnosis:   ICD-10-CM   1. Major depressive disorder, recurrent episode, moderate (HCC) F33.1   2. PTSD (post-traumatic stress disorder) F43.10     Plan: 1.  Patient to continue to  engage in individual counseling 2-4 times a month or as needed. 2.  Patient to identify and apply CBT, coping skills learned in session to decrease depression and anxiety symptoms. 3.  Patient to contact this office, go to the local ED or call 911 if a crisis or emergency develops between visits.  [name], [facility]  This record has been created using [employer].  Chart creation errors have been sought, but may not always have been located and corrected. Such creation errors do not reflect on the standard of medical care.

## 2018-06-12 ENCOUNTER — Encounter: Payer: Self-pay | Admitting: Psychiatry

## 2018-06-17 ENCOUNTER — Ambulatory Visit (INDEPENDENT_AMBULATORY_CARE_PROVIDER_SITE_OTHER): Payer: 59 | Admitting: Psychiatry

## 2018-06-17 ENCOUNTER — Other Ambulatory Visit: Payer: Self-pay

## 2018-06-17 DIAGNOSIS — F4310 Post-traumatic stress disorder, unspecified: Secondary | ICD-10-CM | POA: Diagnosis not present

## 2018-06-17 DIAGNOSIS — F331 Major depressive disorder, recurrent, moderate: Secondary | ICD-10-CM
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
## 2018-06-18 ENCOUNTER — Encounter: Payer: Self-pay | Admitting: Psychiatry

## 2018-06-22 ENCOUNTER — Telehealth: Payer: Self-pay | Admitting: Psychiatry

## 2018-06-22 NOTE — Telephone Encounter (Signed)
Pt stated she usually doesn't call, but she really need to speak with you.

## 2018-06-22 NOTE — Telephone Encounter (Signed)
Returned patient's call twice.  The phone went straight to voicemail so left message for patient that clinician was trying to get in touch with her and to please return call at the office.

## 2018-06-22 NOTE — Telephone Encounter (Signed)
Talked with patient.  She reported she was struggling with the situation.  She was encouraged to try and focus on what she can controls.  She was reminded to use her coping skills.

## 2018-06-24 ENCOUNTER — Ambulatory Visit (INDEPENDENT_AMBULATORY_CARE_PROVIDER_SITE_OTHER): Payer: 59 | Admitting: Psychiatry

## 2018-06-24 ENCOUNTER — Encounter: Payer: Self-pay | Admitting: Psychiatry

## 2018-06-24 ENCOUNTER — Other Ambulatory Visit: Payer: Self-pay

## 2018-06-24 DIAGNOSIS — F4310 Post-traumatic stress disorder, unspecified: Secondary | ICD-10-CM

## 2018-06-24 DIAGNOSIS — F331 Major depressive disorder, recurrent, moderate: Secondary | ICD-10-CM

## 2018-06-24 NOTE — Progress Notes (Signed)
[facility] Counselor/Therapist Progress Note  Patient ID: Diana Ayers, MRN: [id number],    Date: 06/24/2018  Time Spent: 52 minutes start time 11:02 a.m. end time 11:54 AM  Treatment Type: Individual Therapy  Reported Symptoms: triggered responses, anxiety, sadness, somatic complaints, sleep issues  Mental Status Exam:  Appearance:   Casual     Behavior:  Appropriate  Motor:  Normal  Speech/Language:   Normal Rate  Affect:  Appropriate  Mood:  normal  Thought process:  normal  Thought content:    WNL  Sensory/Perceptual disturbances:    WNL  Orientation:  oriented to person, place, time/date and situation  Attention:  Good  Concentration:  Good  Memory:  WNL  Fund of knowledge:   Good  Insight:    Good  Judgment:   Good  Impulse Control:  Good   Risk Assessment: Danger to Self:  No Self-injurious Behavior: No Danger to Others: No Duty to Warn:no Physical Aggression / Violence:No  Access to Firearms a concern: No  Gang Involvement:No   Subjective: Patient was present for session.  Patient reported she is getting very triggered by all that is currently going on in the country.  She shared that it makes her very sad because she sees lots of different points of view and it also makes her fearful for what will happen next.  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  Patient also shared that it is bringing back lots of bad memories of things that have occurred in the past and that she had to be involved in as Engineer, structural.  Different ways to help her keep her mind engaged in positive things and to focus on what she can control and fix and change were discussed with patient.  She was encouraged to recognize that she cannot change the current situation but she can make sure she does the right thing to others in the community and that she has a kind  heart and that can be helpful.  Patient was also encouraged to focus on the good things that can come out of all the difficulties.  Patient agreed to also try and work on releasing her emotions appropriately through different activities to see if that can help her feel better as well.  Patient explained that when she is fishing she does not think about anything so she plans on trying to make several trips over the next week just to disconnect and not be on social media or watch television.  Patient was also encouraged to continue to look for a way that she can get back to work some to give her something else that is positive to help engage in.  Interventions: Solution-Oriented/Positive Psychology  Diagnosis:   ICD-10-CM   1. Major depressive disorder, recurrent episode, moderate (HCC) F33.1   2. PTSD (post-traumatic stress disorder) F43.10     Plan: 1.  Patient to continue to engage in individual counseling 2-4 times a month or as needed. 2.  Patient to identify and apply CBT, coping skills learned in session to decrease depression and anxiety symptoms. 3.  Patient to contact this office, go to the local ED or call 911 if a crisis or emergency develops between visits.  [name], [facility]    This record has been created using [employer].  Chart creation errors have been  sought, but may not always have been located and corrected. Such creation errors do not reflect on the standard of medical care.

## 2018-07-01 ENCOUNTER — Other Ambulatory Visit: Payer: Self-pay

## 2018-07-01 ENCOUNTER — Encounter: Payer: Self-pay | Admitting: Psychiatry

## 2018-07-01 ENCOUNTER — Ambulatory Visit (INDEPENDENT_AMBULATORY_CARE_PROVIDER_SITE_OTHER): Payer: 59 | Admitting: Psychiatry

## 2018-07-01 DIAGNOSIS — F331 Major depressive disorder, recurrent, moderate: Secondary | ICD-10-CM

## 2018-07-01 DIAGNOSIS — F4310 Post-traumatic stress disorder, unspecified: Secondary | ICD-10-CM

## 2018-07-01 NOTE — Progress Notes (Signed)
[facility] Counselor/Therapist Progress Note  Patient ID: ASRA GAMBREL, MRN: [id number],    Date: 07/01/2018  Time Spent: 52 minutes start time 11:32 AM and time 12:24 PM Virtual Visit via Telephone Note Connected with patient by a video enabled telemedicine/telehealth application or telephone, with their informed consent, and verified patient privacy and that I am speaking with the correct person using two identifiers. I discussed the limitations, risks, security and privacy concerns of performing psychotherapy and management service by telephone and the availability of in person appointments. I also discussed with the patient that there may be a patient responsible charge related to this service. The patient expressed understanding and agreed to proceed. I discussed the treatment planning with the patient. The patient was provided an opportunity to ask questions and all were answered. The patient agreed with the plan and demonstrated an understanding of the instructions. The patient was advised to call  our office if  symptoms worsen or feel they are in a crisis state and need immediate contact.   Therapist Location: office Patient Location: at her mother's home    Treatment Type: Individual Therapy  Reported Symptoms: sadness, anxiety, triggered responses  Mental Status Exam:  Appearance:   NA     Behavior:  Appropriate  Motor:  NA  Speech/Language:   Normal Rate  Affect:  NA  Mood:  sad  Thought process:  normal  Thought content:    WNL  Sensory/Perceptual disturbances:    WNL  Orientation:  oriented to person, place, time/date and situation  Attention:  Good  Concentration:  Good  Memory:  Immediate;   [place] of knowledge:   Good  Insight:    Good  Judgment:   Good  Impulse Control:  Good   Risk Assessment: Danger to Self:  No Self-injurious Behavior: No Danger to Others: No Duty to Warn:no Physical Aggression / Violence:No  Access to Firearms a  concern: No  Gang Involvement:No   Subjective: Met with patient via phone. Patient reported that she was struggling with all that is going on in the country and she is still getting triggered.  Discussed different CBT filters to keep her grounded.  The importance of keeping her mind engaged in positive messages and activities was discussed with patient.  She reported she is currently at her mother's house because of a party for her nephew that was in the evening.  She shared she and her sister had been able to go fishing and that was very relaxing and helped her get more grounded.  She is planning on continuing to fish as much as possible since that does seem to bring her peace and is calming.  Patient acknowledged the fact that being with her mother is continuing to be very difficult discussed different strategies to help her manage emotions that may surface while she is interacting with her.  Patient shared she is working hard on trying to maintain a positive outlook and recognize that she does not have to take things personal.  Different strategies to help her continue that were discussed in session and plans were developed with patient.  Patient agreed good to continue the positive steps she is already making and to try and apply what was discussed in session.  Interventions: Cognitive Behavioral Therapy and Solution-Oriented/Positive Psychology  Diagnosis:   ICD-10-CM   1. Major depressive disorder, recurrent episode, moderate (HCC)  F33.1   2. PTSD (post-traumatic stress disorder)  F43.10  Plan: 1.  Patient to continue to engage in individual counseling 2-4 times a month or as needed. 2.  Patient to identify and apply CBT, coping skills learned in session to decrease depression and anxiety symptoms. 3.  Patient to contact this office, go to the local ED or call 911 if a crisis or emergency develops between visits.  [name], [facility]  This record has been created using [employer].   Chart creation errors have been sought, but may not always have been located and corrected. Such creation errors do not reflect on the standard of medical care.

## 2018-07-06 ENCOUNTER — Other Ambulatory Visit: Payer: Self-pay

## 2018-07-06 ENCOUNTER — Encounter: Payer: Self-pay | Admitting: Psychiatry

## 2018-07-06 ENCOUNTER — Ambulatory Visit (INDEPENDENT_AMBULATORY_CARE_PROVIDER_SITE_OTHER): Payer: 59 | Admitting: Psychiatry

## 2018-07-06 DIAGNOSIS — F4312 Post-traumatic stress disorder, chronic: Secondary | ICD-10-CM

## 2018-07-06 DIAGNOSIS — F331 Major depressive disorder, recurrent, moderate: Secondary | ICD-10-CM | POA: Diagnosis not present

## 2018-07-06 NOTE — Progress Notes (Signed)
      [facility] Counselor/Therapist Progress Note  Patient ID: CHIDINMA CLITES, MRN: [id number],    Date: 07/06/2018  Time Spent: 52 minutes start time 11:04 AM and time 11:56 AM  Treatment Type: Individual Therapy  Reported Symptoms: agitated, anger, hurt, sadness, triggered responses  Mental Status Exam:  Appearance:   Casual     Behavior:  Appropriate  Motor:  Normal  Speech/Language:   Normal Rate  Affect:  Appropriate  Mood:  sad  Thought process:  normal  Thought content:    Rumination  Sensory/Perceptual disturbances:    Flashback  Orientation:  oriented to person, place, time/date and situation  Attention:  Good  Concentration:  Good  Memory:  WNL  Fund of knowledge:   Good  Insight:    Good  Judgment:   Good  Impulse Control:  Good   Risk Assessment: Danger to Self:  No Self-injurious Behavior: No Danger to Others: No Duty to Warn:no Physical Aggression / Violence:No  Access to Firearms a concern: No  Gang Involvement:No   Subjective: Patient was present for session.  She reported she had made it through her time with her mother but things were still very difficult.  She also shared she will be going on a trip with her mother and she is concerned about what may happen since they had their issues over their visit.  Patient was encouraged to figure out ways that she could keep the contact down with her mother and to keep the conversations moving in a positive direction.  Patient was encouraged to get a book on tape for the car ride so there would not be so much conversation.  She was also encouraged to remind herself of CBT filters that could help her maintain perspective when she is interacting with her mother.  She also shared she is still very triggered and upset overall that is going on in the country and is not sure how to think through it.  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  Patient was encouraged to continue focusing on what she can control fixing change and to work on letting the other stuff go.  Different strategies to help her do that were discussed in session and plans were developed with patient.  Patient was also encouraged to stay focused on the truth and remember that above all else.  Interventions: Cognitive Behavioral Therapy and Solution-Oriented/Positive Psychology  Diagnosis:   ICD-10-CM   1. Major depressive disorder, recurrent episode, moderate (HCC)  F33.1   2. Chronic post-traumatic stress disorder (PTSD)  F43.12     Plan: 1.  Patient to continue to engage in individual counseling 2-4 times a month or as needed. 2.  Patient to identify and apply CBT, coping skills learned in session to decrease depression symptoms and triggered responses. 3.  Patient to contact this office, go to the local ED or call 911 if a crisis or emergency develops between visits.  [name], [facility]  This record has been created using [employer].  Chart creation errors have been sought, but may not always have been located and corrected. Such creation errors do not reflect on the standard of medical care.

## 2018-07-13 ENCOUNTER — Ambulatory Visit: Payer: 59 | Admitting: Psychiatry

## 2018-07-22 ENCOUNTER — Encounter: Payer: Self-pay | Admitting: Psychiatry

## 2018-07-22 ENCOUNTER — Ambulatory Visit: Payer: 59 | Admitting: Psychiatry

## 2018-07-22 ENCOUNTER — Other Ambulatory Visit: Payer: Self-pay

## 2018-07-22 DIAGNOSIS — F331 Major depressive disorder, recurrent, moderate: Secondary | ICD-10-CM | POA: Diagnosis not present

## 2018-07-22 DIAGNOSIS — F4312 Post-traumatic stress disorder, chronic: Secondary | ICD-10-CM | POA: Diagnosis not present

## 2018-07-22 NOTE — Progress Notes (Signed)
      [facility] Counselor/Therapist Progress Note  Patient ID: DENIM START, MRN: [id number],    Date: 07/22/2018  Time Spent: 52 minutes  Treatment Type: Individual Therapy  Reported Symptoms: depression,  Anxiety,   Mental Status Exam:  Appearance:   Well Groomed     Behavior:  Appropriate  Motor:  Normal  Speech/Language:   Normal Rate  Affect:  Congruent  Mood:  normal  Thought process:  normal  Thought content:    Rumination  Sensory/Perceptual disturbances:    WNL  Orientation:  oriented to person, place, time/date and situation  Attention:  Good  Concentration:  Good  Memory:  Immediate;   [place] of knowledge:   Good  Insight:    Good  Judgment:   Good  Impulse Control:  Good   Risk Assessment: Danger to Self:  No Self-injurious Behavior: No Danger to Others: No Duty to Warn:no Physical Aggression / Violence:No  Access to Firearms a concern: No  Gang Involvement:No   Subjective: Patient was present for session.  She reported that she is continuing to struggle with all that is going on in the country.  She asked how to handle it people are wanting to have discussions that she is just not comfortable having.  Patient was encouraged just to be honest and to let them know that it makes her very sad and overwhelmed and this time she would rather just not talk about some things since there does not seem to be a solution anyways.  Patient reported that her mother has a tendency to try and make political comments to try and engage in some sort of discussion.  Ways to handle her with her were addressed as well.  Patient explained that she is hopeful to go down and stay with her for 4 July.  She shared she is realizing that what her dad would want her to do is to love her mother so she is trying to do that.  Patient acknowledged it was very stressful discussed ways to talk her self through her mother's behaviors and comments as well as CBT skills to help her stay  focused on her purpose rather than her mothers comments.  Patient shared that her fishing has brought her lots of joy the importance of continuing that behavior were discussed with patient and different plans developed.  She agreed to try to follow plans in session and to work hard on her positive affirmations and when her thoughts start going negative to try and stop and think of what she is grateful for.  Interventions: Cognitive Behavioral Therapy and Solution-Oriented/Positive Psychology  Diagnosis:   ICD-10-CM   1. Major depressive disorder, recurrent episode, moderate (HCC)  F33.1   2. Chronic post-traumatic stress disorder (PTSD)  F43.12     Plan: 1.  Patient to continue to engage in individual counseling 2-4 times a month or as needed. 2.  Patient to identify and apply CBT, coping skills learned in session to decrease depression and anxiety symptoms. 3.  Patient to contact this office, go to the local ED or call 911 if a crisis or emergency develops between visits.  [name], [facility]   This record has been created using [employer].  Chart creation errors have been sought, but may not always have been located and corrected. Such creation errors do not reflect on the standard of medical care.

## 2018-07-28 ENCOUNTER — Ambulatory Visit: Payer: 59 | Admitting: Psychiatry

## 2018-07-28 ENCOUNTER — Other Ambulatory Visit: Payer: Self-pay

## 2018-07-28 DIAGNOSIS — F4312 Post-traumatic stress disorder, chronic: Secondary | ICD-10-CM

## 2018-07-28 DIAGNOSIS — F331 Major depressive disorder, recurrent, moderate: Secondary | ICD-10-CM

## 2018-07-28 NOTE — Progress Notes (Signed)
      [facility] Counselor/Therapist Progress Note  Patient ID: Diana Ayers, MRN: [id number],    Date: 07/28/2018  Time Spent: 52 minutes  Treatment Type: Individual Therapy  Reported Symptoms: anxiety, sadness, triggered memories  Mental Status Exam:  Appearance:   Well Groomed     Behavior:  Appropriate  Motor:  Normal  Speech/Language:   Normal Rate  Affect:  Appropriate  Mood:  anxious  Thought process:  normal  Thought content:    Rumination  Sensory/Perceptual disturbances:    WNL  Orientation:  oriented to person, place, time/date and situation  Attention:  Good  Concentration:  Good  Memory:  Immediate;   [place] of knowledge:   Good  Insight:    Good  Judgment:   Good  Impulse Control:  Good   Risk Assessment: Danger to Self:  No Self-injurious Behavior: No Danger to Others: No Duty to Warn:no Physical Aggression / Violence:No  Access to Firearms a concern: No  Gang Involvement:No   Subjective: Patient was present for session.  She reported that she was continuing to [place]  Patient shared that she has had some triggers as she is watched and heard about different stories.  She also reported that she is also continuing to struggle with isolation and feeling like she is missing her purpose.  Patient shared she is starting to notice that she has some hoarding tendencies and she wants to figure out what to do about those.  Had patient discussed what was going on with her and it seemed more that she gets overwhelmed and shuts down and does nothing rather than is truly hoarding.  Discussed a different plan to deal with the situation and help her break things down into manageable pieces so that she can accomplish goals she needs to.  Also discussed the importance of using CBT skills to help her figure out what she needs to let go of.  Patient was also encouraged that when she finds things to donate that she  goes and talks to a couple different facilities to see if she can volunteer there as well as donate her items.  Patient reported feeling positive about the plan and agreed to try and follow through with them over the next week.  Interventions: Cognitive Behavioral Therapy and Solution-Oriented/Positive Psychology  Diagnosis:   ICD-10-CM   1. Major depressive disorder, recurrent episode, moderate (HCC)  F33.1   2. Chronic post-traumatic stress disorder (PTSD)  F43.12     Plan: 1.  Patient to continue to engage in individual counseling 2-4 times a month or as needed. 2.  Patient to identify and apply CBT, coping skills learned in session to decrease depression and anxiety symptoms. 3.  Patient to contact this office, go to the local ED or call 911 if a crisis or emergency develops between visits.  [name], [facility]   This record has been created using [employer].  Chart creation errors have been sought, but may not always have been located and corrected. Such creation errors do not reflect on the standard of medical care.

## 2018-07-29 ENCOUNTER — Encounter: Payer: Self-pay | Admitting: Psychiatry

## 2018-08-04 ENCOUNTER — Ambulatory Visit: Payer: 59 | Admitting: Psychiatry

## 2018-08-11 ENCOUNTER — Encounter: Payer: Self-pay | Admitting: Psychiatry

## 2018-08-11 ENCOUNTER — Other Ambulatory Visit: Payer: Self-pay

## 2018-08-11 ENCOUNTER — Ambulatory Visit (INDEPENDENT_AMBULATORY_CARE_PROVIDER_SITE_OTHER): Payer: 59 | Admitting: Psychiatry

## 2018-08-11 DIAGNOSIS — F331 Major depressive disorder, recurrent, moderate: Secondary | ICD-10-CM

## 2018-08-11 DIAGNOSIS — F4312 Post-traumatic stress disorder, chronic: Secondary | ICD-10-CM

## 2018-08-11 NOTE — Progress Notes (Signed)
      [facility] Counselor/Therapist Progress Note  Patient ID: Diana Ayers, MRN: [id number],    Date: 08/11/2018  Time Spent: 52 minutes start time 1:06 p.m. end time 1:58 PM  Treatment Type: Individual Therapy  Reported Symptoms: triggered responses, sadness, anxiety, frustration  Mental Status Exam:  Appearance:   Well Groomed     Behavior:  Appropriate  Motor:  Normal  Speech/Language:   Normal Rate  Affect:  Appropriate  Mood:  anxious  Thought process:  normal  Thought content:    Rumination  Sensory/Perceptual disturbances:    WNL  Orientation:  oriented to person, place, time/date and situation  Attention:  Good  Concentration:  Good  Memory:  Immediate;   [place] of knowledge:   Good  Insight:    Good  Judgment:   Good  Impulse Control:  Good   Risk Assessment: Danger to Self:  No Self-injurious Behavior: No Danger to Others: No Duty to Warn:no Physical Aggression / Violence:No  Access to Firearms a concern: No  Gang Involvement:No   Subjective: Patient was present for session.  She reported she had made it back from her mothers.  She shared there were a few times when she got very triggered but she try to use her coping skills and just disengaged from her mother during those times.  Patient stated she had planned a birthday party for her sister and that went very positively.  She shared they all did social distancing and met outside and her sister was very pleased with the outcome.  Patient went on to explain she is still finding herself getting triggered at lots of different times and struggles with how to manage emotions.  Discussed the importance of her continuing to use her coping skills and focusing on her positive thoughts and trying to remind herself of things she is thankful for on a regular basis.  Patient shared she had been able to follow her plans to start dealing with the mess in her house and getting rid of some things.  Patient stated  breaking things down was making things effective and she was able to complete tasks and feel more positive about that.  The importance of continuing to work on the skills were discussed with patient and plans were developed in session.  Interventions: Cognitive Behavioral Therapy and Solution-Oriented/Positive Psychology  Diagnosis:   ICD-10-CM   1. Major depressive disorder, recurrent episode, moderate (HCC)  F33.1   2. Chronic post-traumatic stress disorder (PTSD)  F43.12     Plan: 1.  Patient to continue to engage in individual counseling 2-4 times a month or as needed. 2.  Patient to identify and apply CBT, coping skills learned in session to decrease depression and anxiety symptoms. 3.  Patient to contact this office, go to the local ED or call 911 if a crisis or emergency develops between visits.  [name], [facility]    This record has been created using [employer].  Chart creation errors have been sought, but may not always have been located and corrected. Such creation errors do not reflect on the standard of medical care.

## 2018-08-27 ENCOUNTER — Encounter: Payer: Self-pay | Admitting: Psychiatry

## 2018-08-27 ENCOUNTER — Ambulatory Visit (INDEPENDENT_AMBULATORY_CARE_PROVIDER_SITE_OTHER): Payer: 59 | Admitting: Psychiatry

## 2018-08-27 ENCOUNTER — Other Ambulatory Visit: Payer: Self-pay

## 2018-08-27 DIAGNOSIS — F4312 Post-traumatic stress disorder, chronic: Secondary | ICD-10-CM | POA: Diagnosis not present

## 2018-08-27 DIAGNOSIS — F331 Major depressive disorder, recurrent, moderate: Secondary | ICD-10-CM | POA: Diagnosis not present

## 2018-08-27 NOTE — Progress Notes (Signed)
[facility] Counselor/Therapist Progress Note  Patient ID: Diana Ayers, MRN: [id number],    Date: 08/27/2018  Time Spent: 52 minutes   Treatment Type: Individual Therapy  Reported Symptoms: anxiety, sadness, triggered responses  Mental Status Exam:  Appearance:   Well Groomed     Behavior:  Appropriate  Motor:  Normal  Speech/Language:   Normal Rate  Affect:  Appropriate  Mood:  normal  Thought process:  normal  Thought content:    WNL  Sensory/Perceptual disturbances:    WNL  Orientation:  oriented to person, place, time/date and situation  Attention:  Good  Concentration:  Good  Memory:  WNL  Fund of knowledge:   Good  Insight:    Good  Judgment:   Good  Impulse Control:  Good   Risk Assessment: Danger to Self:  No Self-injurious Behavior: No Danger to Others: No Duty to Warn:no Physical Aggression / Violence:No  Access to Firearms a concern: No  Gang Involvement:No   Subjective: Patient was present for session.  She reported that she is working again which is a positive thing for her.   She went on to explain there have been different triggers for her at her workplace situation.  Patient was able to discuss the different issues and ways to manage the triggers appropriately were discussed with patient.  Also some possible solutions to the triggers were addressed as well.  Patient agreed to follow plans so that she can continue working.  Patient also shared she starts her on-call [place] 6 weeks for [employer] on [place].  She shared she had some anxiety over that as well.  She shared she is trying to focus on her self talk and reminding herself that there is nothing that she can do so she just has to breathe and follow through on what is expected.  Patient's uncle is also in hospice care and they do not think he has much longer to live.  She explained that her mother wants to go down and see him but she has a UTI and is in pain with her shoulder so she is not  sure what to do with that.  Also she is started a new job and has potential [employer] so she is unable to do anything with her for next week.  Discussed what she could do to help her mother get to see her uncle who is several hours away.  Encourage patient to recognize that if her mother does not choose that options and she has to let her mother know that she needs to contact her sister or someone else to take her because she is unable.  Patient reported she feels lots of pressure that she needs to be the one to do it.  She was encouraged to work on her CBT filters and use positive self talk and reminding herself she can give her mother choices but she is not the only daughter that she has so her other daughters can help her mother since patient has already taken her to see her brother.  Patient agreed to work on her self talk and to try and keep herself grounded and to make sure she remembers that she is doing enough.  Interventions: Cognitive Behavioral Therapy and Solution-Oriented/Positive Psychology  Diagnosis:   ICD-10-CM   1. Major depressive disorder, recurrent episode, moderate (HCC)  F33.1   2. Chronic post-traumatic stress disorder (PTSD)  F43.12     Plan: 1.  Patient to continue  to engage in individual counseling 2-4 times a month or as needed. 2.  Patient to identify and apply CBT, coping skills learned in session to decrease depression and anxiety symptoms. 3.  Patient to contact this office, go to the local ED or call 911 if a crisis or emergency develops between visits.  [name], [facility]  This record has been created using [employer].  Chart creation errors have been sought, but may not always have been located and corrected. Such creation errors do not reflect on the standard of medical care.

## 2018-08-30 ENCOUNTER — Telehealth: Payer: Self-pay | Admitting: Psychiatry

## 2018-08-30 NOTE — Telephone Encounter (Signed)
Returned patient's call.  She reported that her uncle did pass and now there is lots of family drama concerning going to the funeral.  She discussed what was going on in the situation.  Encouraged patient to allow her family to figure out what they wanted to do and continue to just maintain her boundaries.

## 2018-08-30 NOTE — Telephone Encounter (Signed)
Pt called stated some things have come up and she really need to speak with you.

## 2018-09-01 ENCOUNTER — Ambulatory Visit (INDEPENDENT_AMBULATORY_CARE_PROVIDER_SITE_OTHER): Payer: 59 | Admitting: Psychiatry

## 2018-09-01 ENCOUNTER — Encounter: Payer: Self-pay | Admitting: Psychiatry

## 2018-09-01 ENCOUNTER — Other Ambulatory Visit: Payer: Self-pay

## 2018-09-01 DIAGNOSIS — F331 Major depressive disorder, recurrent, moderate: Secondary | ICD-10-CM | POA: Diagnosis not present

## 2018-09-01 DIAGNOSIS — F4312 Post-traumatic stress disorder, chronic: Secondary | ICD-10-CM

## 2018-09-01 NOTE — Progress Notes (Signed)
[facility] Counselor/Therapist Progress Note  Patient ID: Diana Ayers, MRN: [id number],    Date: 09/01/2018  Time Spent: 50 minutes start time 11:15 AM end time 12:05 PM  Treatment Type: Individual Therapy  Reported Symptoms: sadness, anxiety, grief issues, triggered responses  Mental Status Exam:  Appearance:   Well Groomed     Behavior:  Sharing  Motor:  Normal  Speech/Language:   Normal Rate  Affect:  Congruent  Mood:  sad  Thought process:  normal  Thought content:    Rumination  Sensory/Perceptual disturbances:    WNL  Orientation:  oriented to person, place, time/date and situation  Attention:  Good  Concentration:  Good  Memory:  WNL  Fund of knowledge:   Good  Insight:    Good  Judgment:   Good  Impulse Control:  Good   Risk Assessment: Danger to Self:  No Self-injurious Behavior: No Danger to Others: No Duty to Warn:no Physical Aggression / Violence:No  Access to Firearms a concern: No  Gang Involvement:No   Subjective: Patient was present for session.  Patient shared she is still continuing to have sadness over the death of her uncle.  She also reported that this week is a very difficult month because her father's and mother's anniversary is this month as well as her favorite aunt's anniversary death date.  Patient explained she is noticed a decrease in her mood and it took her a little while to realize what was being triggered.  Patient shared she does not feel she got good closure at the time with her aunt because she passed away before she could get to her.  Discussed the possibility of writing her aunt a letter and letting her know how important she was with her.  She also shared there is still lots of drama around going to her uncle's funeral between her mother sister aunt and cousin.  Patient was encouraged to allow them to figure out what they need since they are all adults and to stay more focused on taking care of herself since that is what  she has control over at this time anyways.  Patient reported she has not been able to get back to many funerals since she lost her very close friend to cancer.  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  Patient shared she had gone to her dad's funeral but for the most part she tries to avoid funerals because of how triggering they are.  Discussed what is normal and in the importance of her getting closure on relationships regardless of whether or not she chooses to go to the funeral.  Different ways to do that were discussed with patient as she was encouraged to recognize what she is comfortable with and not feel pressured to make decisions based on what others feel is important to them.  She explained that at work she has to deal with a lot of death and that that can be hard even though she enjoys working with older people and a lot of older people seem to come to the hospital.  The importance of her just focusing on what she can do for the people that are coming was discussed with patient and plans were developed to keep herself at a positive place.  Patient was reminded her thoughts lead to feelings lead to behaviors so she has  to focus on the positive thoughts and keep moving in a good direction.  Interventions: Cognitive Behavioral Therapy and Solution-Oriented/Positive Psychology  Diagnosis:   ICD-10-CM   1. Major depressive disorder, recurrent episode, moderate (HCC)  F33.1   2. Chronic post-traumatic stress disorder (PTSD)  F43.12     Plan: 1.  Patient to continue to engage in individual counseling 2-4 times a month or as needed. 2.  Patient to identify and apply CBT, coping skills learned in session to decrease depression and anxiety symptoms. 3.  Patient to contact this office, go to the local ED or call 911 if a crisis or emergency develops between visits.  [name], [facility]    This  record has been created using [employer].  Chart creation errors have been sought, but may not always have been located and corrected. Such creation errors do not reflect on the standard of medical care.

## 2018-09-08 ENCOUNTER — Ambulatory Visit: Payer: 59 | Admitting: Psychiatry

## 2018-09-23 ENCOUNTER — Other Ambulatory Visit: Payer: Self-pay

## 2018-09-23 ENCOUNTER — Ambulatory Visit (INDEPENDENT_AMBULATORY_CARE_PROVIDER_SITE_OTHER): Payer: 59 | Admitting: Psychiatry

## 2018-09-23 DIAGNOSIS — F4312 Post-traumatic stress disorder, chronic: Secondary | ICD-10-CM

## 2018-09-23 NOTE — Progress Notes (Signed)
      [facility] Counselor/Therapist Progress Note  Patient ID: Diana Ayers, MRN: [id number],    Date: 09/23/2018  Time Spent: 52 minutes start time 11:09 a.m. end time 12:01 PM  Treatment Type: Individual Therapy  Reported Symptoms: triggered responses, anxiety, sadness, flashbacks  Mental Status Exam:  Appearance:   Well Groomed     Behavior:  Sharing  Motor:  Normal  Speech/Language:   Normal Rate  Affect:  Appropriate  Mood:  anxious  Thought process:  normal  Thought content:    WNL  Sensory/Perceptual disturbances:    WNL  Orientation:  oriented to person, place, time/date and situation  Attention:  Good  Concentration:  Good  Memory:  Immediate;   [place] of knowledge:   Good  Insight:    Good  Judgment:   Good  Impulse Control:  Good   Risk Assessment: Danger to Self:  No Self-injurious Behavior: No Danger to Others: No Duty to Warn:no Physical Aggression / Violence:No  Access to Firearms a concern: No  Gang Involvement:No   Subjective: Patient was present for session.  She explained that she is starting to have more triggered responses and flashbacks.  She discussed at her new job she is dealing with a lot of death and people that are grieving the loss of loved ones which she is finding very triggering for her.  Patient reported she feels a responsibility that she knows is not necessarily rational but is not sure how to manage the emotions.  Did E MDR set on the issue.  The picture was when she was 56 and thinking her mother was going to die due to driving drunk, suds level 10, negative cognition "I am responsible", felt anxiety in her chest.  Patient was able to reduce suds level to 3.  She was able to recognize the fact that her mother was responsible for her own choices and even today it may be difficult but she has to allow her mother to have the choices for her health concerns.  Discussed the importance of her reminding herself regularly that she is  not responsible for other people she can utilize her box theory as needed.  Also she was encouraged to continue trying just to listen and express empathy while reminding herself that she is not supposed to fix the situation with her mother or other people.  Interventions: Solution-Oriented/Positive Psychology and Eye Movement Desensitization and Reprocessing (EMDR)  Diagnosis:   ICD-10-CM   1. Chronic post-traumatic stress disorder (PTSD)  F43.12     Plan: Patient is to work on reminding herself regularly that she is only responsible for herself and not for others.  She is to utilize her box theory/CBT skills to help maintain perspective.  She is also to work on listening to her mother while reminding herself she is not to fix her mother just love her.  Long term goal: Develop and implement effective coping skills that allow for carrying out normal responsibilities and participating relationships and social activities Short term goal: Identified and replaced negative self-talk and catastrophizing that is associated with past trauma and current stimulus triggers for anxiety Diana Ayers, [facility]

## 2018-09-24 ENCOUNTER — Encounter: Payer: Self-pay | Admitting: Psychiatry

## 2018-09-30 ENCOUNTER — Ambulatory Visit: Payer: 59 | Admitting: Psychiatry

## 2018-10-14 ENCOUNTER — Other Ambulatory Visit: Payer: Self-pay

## 2018-10-14 ENCOUNTER — Encounter: Payer: Self-pay | Admitting: Psychiatry

## 2018-10-14 ENCOUNTER — Ambulatory Visit (INDEPENDENT_AMBULATORY_CARE_PROVIDER_SITE_OTHER): Payer: 59 | Admitting: Psychiatry

## 2018-10-14 DIAGNOSIS — F4312 Post-traumatic stress disorder, chronic: Secondary | ICD-10-CM

## 2018-10-14 NOTE — Progress Notes (Signed)
[facility] Counselor/Therapist Progress Note  Patient ID: Diana Ayers, MRN: [id number],    Date: 10/14/2018  Time Spent: 52 minutes start time 11:07 AM end time 11:59 AM  Treatment Type: Individual Therapy  Reported Symptoms: triggered responses, anxiety, sadness, frustration, irritable  Mental Status Exam:  Appearance:   Well Groomed     Behavior:  Sharing  Motor:  Normal  Speech/Language:   Normal Rate  Affect:  Appropriate  Mood:  irritable  Thought process:  normal  Thought content:    WNL  Sensory/Perceptual disturbances:    WNL  Orientation:  oriented to person, place, time/date and situation  Attention:  Good  Concentration:  Good  Memory:  Immediate;   [place] of knowledge:   Good  Insight:    Good  Judgment:   Good  Impulse Control:  Good   Risk Assessment: Danger to Self:  No Self-injurious Behavior: No Danger to Others: No Duty to Warn:no Physical Aggression / Violence:No  Access to Firearms a concern: No  Gang Involvement:No   Subjective: Patient was present for session.  She reported things continue to be up and down with her mother's health.  Patient reported that there is a thought she is going to die in the middle of the night so it keeps her on edge.  Patient stated she is worried all the time she is going to die because mom is not going to call 911 if she needs them because she doesn't want the sirens coming to her house.  Mother and sister are fighting and they are putting patient in the middle and that is creating stress.  Had patient participate in an E MDR exercise.  What came out is that she feels responsible for mom and has been told since she was little that she was responsible for her.  Did E MDR set on 1 of her first memories at age 58 when her sister was born.  Suds level 10, negative cognition "I am responsible for mom", felt anxiety and betrayal in her chest.  Patient was able to reduce suds level to 4.  She shared that she  realizes as a child she should not have been responsible for her mother who was an alcoholic through her childhood.  She shared that she is still struggling with exactly how to figure out how to love and care for her mother now that she is older.  Encourage patient to journal the issue over the next week and to remind herself if her mother is capable of making a decision about something she has to allow her to do it.  Also she has the right to tell her mother " no" to something even though it may be difficult for her.  Patient agreed to try and practice setting appropriate limits with her mother even though she is worried about her health at this time.  Interventions: Cognitive Behavioral Therapy, Solution-Oriented/Positive Psychology and Eye Movement Desensitization and Reprocessing (EMDR)  Diagnosis:   ICD-10-CM   1. Chronic post-traumatic stress disorder (PTSD)  F43.12     Plan: Patient is to work on telling herself the truth when it comes to her mother and her mother's decisions.  She is to utilize CBT skills to help her manage triggered responses to her mother's behaviors and health issues.  Patient is also to work on practicing saying "no" as she needs to to make sure she takes care of herself rather than feeling that she has  to be ignored so that she does what her mother feels she needs to do concerning taking care of her.  Patient is also to work on journaling and exercising to release emotions appropriately.  Long-term goal: Develop and met effective coping skills that allow for carrying out normal responsibilities and participating  relationships and social activities Short-term goal: Identified and replace negative self talk and catastrophic sizing that is associated with past trauma and current stimulus triggers for anxiety  [name], [facility]

## 2018-10-21 ENCOUNTER — Other Ambulatory Visit: Payer: Self-pay

## 2018-10-21 ENCOUNTER — Ambulatory Visit (INDEPENDENT_AMBULATORY_CARE_PROVIDER_SITE_OTHER): Payer: 59 | Admitting: Psychiatry

## 2018-10-21 ENCOUNTER — Encounter: Payer: Self-pay | Admitting: Psychiatry

## 2018-10-21 DIAGNOSIS — F4312 Post-traumatic stress disorder, chronic: Secondary | ICD-10-CM
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
## 2018-11-04 ENCOUNTER — Ambulatory Visit (INDEPENDENT_AMBULATORY_CARE_PROVIDER_SITE_OTHER): Payer: 59 | Admitting: Psychiatry

## 2018-11-04 ENCOUNTER — Other Ambulatory Visit: Payer: Self-pay

## 2018-11-04 DIAGNOSIS — F4312 Post-traumatic stress disorder, chronic: Secondary | ICD-10-CM
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
## 2018-11-11 ENCOUNTER — Other Ambulatory Visit: Payer: Self-pay

## 2018-11-11 ENCOUNTER — Ambulatory Visit (INDEPENDENT_AMBULATORY_CARE_PROVIDER_SITE_OTHER): Payer: 59 | Admitting: Psychiatry

## 2018-11-11 DIAGNOSIS — F4310 Post-traumatic stress disorder, unspecified: Secondary | ICD-10-CM

## 2018-11-11 NOTE — Progress Notes (Signed)
      [facility] Counselor/Therapist Progress Note  Patient ID: Diana Ayers, MRN: [id number],    Date: 11/11/2018  Time Spent: 50 minutes start time 11:03 AM end time 11:53 AM  Treatment Type: Individual Therapy  Reported Symptoms: anxiety, triggered responses, irritablity  Mental Status Exam:  Appearance:   Well Groomed     Behavior:  Appropriate  Motor:  Normal  Speech/Language:   Normal Rate  Affect:  Congruent  Mood:  irritable  Thought process:  normal  Thought content:    WNL  Sensory/Perceptual disturbances:    WNL  Orientation:  oriented to person, place, time/date and situation  Attention:  Good  Concentration:  Good  Memory:  WNL  Fund of knowledge:   Good  Insight:    Good  Judgment:   Good  Impulse Control:  Good   Risk Assessment: Danger to Self:  No Self-injurious Behavior: No Danger to Others: No Duty to Warn:no Physical Aggression / Violence:No  Access to Firearms a concern: No  Gang Involvement:No   Subjective: Patient was present for session.  Updated treatment plan in session patient reported that she feels progress is being made on her goals.  Made change to short-term goal.  Could not sign due to coronavirus.  She reported that she is very frustrated with her work.  She also shared that she is realizing she has been triggered with her work due to the difficulty with consistency and dependability with her work situation.  Also her mother continues to have health issues that she has to deal with regularly.  Patient was encouraged to work on maintaining an appropriate perspective to be able to handle the different situations.  Discussed the importance of her staying grounded and to put the right positive things into her head.  Encouraged patient to watch YouTube videos by Dr. Tawanna Solo to help remind herself of the importance of positive affirmations and putting positive statements into her head.  Patient was also able to develop some CBT  filters to help her deal with her work situation and her mother appropriately.  Patient agreed to continue working on her exercising and taking time to fish to relax and calm herself on a regular basis.  Patient is also getting continue working on her diet since she is already lost 38 pounds and is feeling better about her weight.  Interventions: Cognitive Behavioral Therapy and Solution-Oriented/Positive Psychology  Diagnosis:   ICD-10-CM   1. PTSD (post-traumatic stress disorder)  F43.10     Plan: Patient is to utilize CBT filters and coping skills to help her maintain perspective and manage emotions especially when she gets triggered.  Patient is going to work on her diet and exercise to help continue releasing emotions appropriately and losing weight Long-term goal: Develop and implement effective coping skills that allow for carrying out normal responsibilities and participating relationships and social activities Short-term goal: Practice implement relaxation training as a coping mechanism for tension panic stress anger and anxiety  [name], [facility]

## 2018-11-18 ENCOUNTER — Ambulatory Visit (INDEPENDENT_AMBULATORY_CARE_PROVIDER_SITE_OTHER): Payer: 59 | Admitting: Psychiatry

## 2018-11-18 ENCOUNTER — Encounter: Payer: Self-pay | Admitting: Psychiatry

## 2018-11-18 ENCOUNTER — Other Ambulatory Visit: Payer: Self-pay

## 2018-11-18 DIAGNOSIS — F4312 Post-traumatic stress disorder, chronic: Secondary | ICD-10-CM
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
## 2018-12-08 ENCOUNTER — Ambulatory Visit (INDEPENDENT_AMBULATORY_CARE_PROVIDER_SITE_OTHER): Payer: 59 | Admitting: Psychiatry

## 2018-12-08 ENCOUNTER — Other Ambulatory Visit: Payer: Self-pay

## 2018-12-08 DIAGNOSIS — F4312 Post-traumatic stress disorder, chronic: Secondary | ICD-10-CM | POA: Diagnosis not present
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
## 2018-12-20 ENCOUNTER — Ambulatory Visit (INDEPENDENT_AMBULATORY_CARE_PROVIDER_SITE_OTHER): Payer: 59 | Admitting: Psychiatry

## 2018-12-20 ENCOUNTER — Other Ambulatory Visit: Payer: Self-pay

## 2018-12-20 ENCOUNTER — Encounter: Payer: Self-pay | Admitting: Psychiatry

## 2018-12-20 DIAGNOSIS — F4312 Post-traumatic stress disorder, chronic: Secondary | ICD-10-CM | POA: Diagnosis not present

## 2018-12-20 NOTE — Progress Notes (Signed)
[facility] Counselor/Therapist Progress Note  Patient ID: Diana Ayers, MRN: [id number],    Date: 12/20/2018  Time Spent: 52 minutes start time 10:05 AM end time 10:57 AM  Treatment Type: Individual Therapy  Reported Symptoms: anxious, sad, triggered responses  Mental Status Exam:  Appearance:   Casual     Behavior:  Sharing  Motor:  Normal  Speech/Language:   Normal Rate  Affect:  Congruent  Mood:  anxious  Thought process:  normal  Thought content:    Rumination  Sensory/Perceptual disturbances:    WNL  Orientation:  oriented to person, place, time/date and situation  Attention:  Fair  Concentration:  Fair  Memory:  Immediate;   [place] of knowledge:   Good  Insight:    Good  Judgment:   Good  Impulse Control:  Good   Risk Assessment: Danger to Self:  No Self-injurious Behavior: No Danger to Others: No Duty to Warn:no Physical Aggression / Violence:No  Access to Firearms a concern: No  Gang Involvement:No   Subjective: Patient was present for session.  She shared that she was stressed over an issue with a rental car.  She explained she may be over the top with wanting to have a vehicle that is higher off the ground.  She explained that a friend was killed ina car accident over the weekend and she realizes triggering. She went on to share that she went to Thanksgiving with her mother and it well over all.   She did not realize she has a tendency to try and fix things for others especially her family.  She is starting to become aware that she has to allow them to have the option for help for no help.  Patient explained some situations that she has been dealing with concerning her family and how she is realizing she has to pull back to deal with things in an appropriate manner.  The importance of using her CBT filters to focus on the things that she can control fix and change and continue throwing the other things out of her box was discussed with patient.   Patient discussed the issue with balance concerning her mother because she has difficulty with her boundaries and has put patient through so much in the past it can be difficult for her to assess what is a positive thing and what is too much when it comes to her mom.  Discussed different frameworks to help her recognize when she is getting to involved in the situation and needs to set a limit to take care of herself and keep herself grounded in an appropriate manner.  Patient was able to develop some plans and agreed to use her CBT filters to help her.  Interventions: Cognitive Behavioral Therapy and Solution-Oriented/Positive Psychology  Diagnosis:   ICD-10-CM   1. Chronic post-traumatic stress disorder (PTSD)  F43.12     Plan: Patient is to use CBT and coping skills to help manage triggered responses appropriately.  She was reminded of the box theory to help her keep her mother's issues separate from hers.  Patient was reminded she can has to focus on the things she can control fix and change and let the other things go.  She is good to try and find times to exercise and fish to find peace and relaxation. Long term goal: Develop and implement effective coping skills to carry out normal responsibilities and participate constructively in  relationships Short term goal: Practice implement  relaxation training as a coping mechanism for anger, tension, anxiety, panic, stress  [name], [facility]

## 2018-12-29 ENCOUNTER — Encounter: Payer: Self-pay | Admitting: Psychiatry

## 2018-12-29 ENCOUNTER — Ambulatory Visit (INDEPENDENT_AMBULATORY_CARE_PROVIDER_SITE_OTHER): Payer: 59 | Admitting: Psychiatry

## 2018-12-29 ENCOUNTER — Other Ambulatory Visit: Payer: Self-pay

## 2018-12-29 DIAGNOSIS — F4312 Post-traumatic stress disorder, chronic: Secondary | ICD-10-CM | POA: Diagnosis not present

## 2018-12-29 NOTE — Progress Notes (Signed)
[facility] Counselor/Therapist Progress Note  Patient ID: Diana Ayers, MRN: [id number],    Date: 12/29/2018  Time Spent: 50 minutes start time 10:10 AM end time 11 AM Virtual Visit via Telephone Note Connected with patient by a video enabled telemedicine/telehealth application, with their informed consent, and verified patient privacy and that I am speaking with the correct person using two identifiers. I discussed the limitations, risks, security and privacy concerns of performing psychotherapy and management service by telephone and the availability of in person appointments. I also discussed with the patient that there may be a patient responsible charge related to this service. The patient expressed understanding and agreed to proceed. I discussed the treatment planning with the patient. The patient was provided an opportunity to ask questions and all were answered. The patient agreed with the plan and demonstrated an understanding of the instructions. The patient was advised to call  our office if  symptoms worsen or feel they are in a crisis state and need immediate contact.   Therapist Location: office Patient Location: home   Treatment Type: Individual Therapy  Reported Symptoms: sadness, anxiety, nightmares, over eating, grief issues, triggered moments, sleep issues  Mental Status Exam:  Appearance:   Casual     Behavior:  Sharing  Motor:  Normal  Speech/Language:   Normal Rate  Affect:  Appropriate  Mood:  sad  Thought process:  normal  Thought content:    WNL  Sensory/Perceptual disturbances:    WNL  Orientation:  oriented to person, place, time/date and situation  Attention:  Good  Concentration:  Good  Memory:  WNL  Fund of knowledge:   Good  Insight:    Good  Judgment:   Good  Impulse Control:  Good   Risk Assessment: Danger to Self:  No Self-injurious Behavior: No Danger to Others: No Duty to Warn:no Physical Aggression / Violence:No  Access  to Firearms a concern: No  Gang Involvement:No   Subjective: Met with patient via virtual session through [employer].  She shared that she was in [place] with her mother to help her get some things done around the house that need to be done.  She shared that her sister had come up for the weekend and while she was there her boyfriend's father died of COVID suddenly and that put it on patient's heart to make sure she helps her mother while she has time.  She shared that it is been hard for her to be there with her mother.  She is noticing a decline physically and emotionally with her mother.  She reported she is going to have to accept that her mother is not doing well.  She shared her mother states things about when she dies.  Encouraged her to ask her mother what she is trying to tell her when she makes those statements. As she is helping her mother go through things she is finding many things of her father's and it is bringing up lots of memories and feelings.  She stated she is noticing that she is having a desire to spend money and she is not sure why she needs to do that.  Had patient discuss the different situations and what she felt was the reason behind wanting to spend the money.  Patient was able to recognize that some of her desires are appropriate and getting the truck would be helpful but at this time it may not be practical.  Encouraged her to take time to  think about her choice and not do anything until after the new year.  Patient agreed that she would take the time to really consider what would be the most effective thing for her to do and to not make an impulsive decision about buying a truck at this time.  Also discussed the fact that looking for trucks online may be given her brain away disconnect from the sadness and triggered responses with her mother so that may be okay so long that she does not necessarily act on a quick purchase.  Interventions: Cognitive Behavioral Therapy and  Solution-Oriented/Positive Psychology  Diagnosis:   ICD-10-CM   1. Chronic post-traumatic stress disorder (PTSD)  F43.12     Plan: Patient is to utilize CBT and coping skills to manage triggered responses appropriately.  Patient is also to work on talking with her mother about some of her statements and choices so she can understand what her mother is really feeling.  Patient is to continue finding ways to go fishing and exercise to help relax and reduce negative emotions. Long-term goal: Develop and implement effective coping skills to carry out normal responsibilities and participate constructively in relationships Short-term goal: Practice implement relaxation training as a coping mechanism for tension panic stress anger and anxiety  [name], [facility]

## 2019-01-05 ENCOUNTER — Ambulatory Visit: Payer: 59 | Admitting: Psychiatry

## 2019-01-12 ENCOUNTER — Ambulatory Visit: Payer: 59 | Admitting: Psychiatry

## 2019-01-12 ENCOUNTER — Ambulatory Visit (INDEPENDENT_AMBULATORY_CARE_PROVIDER_SITE_OTHER): Payer: 59 | Admitting: Psychiatry

## 2019-01-12 ENCOUNTER — Other Ambulatory Visit: Payer: Self-pay

## 2019-01-12 ENCOUNTER — Encounter: Payer: Self-pay | Admitting: Psychiatry

## 2019-01-12 DIAGNOSIS — F4312 Post-traumatic stress disorder, chronic: Secondary | ICD-10-CM | POA: Diagnosis not present
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
## 2019-01-19 ENCOUNTER — Other Ambulatory Visit: Payer: Self-pay

## 2019-01-19 ENCOUNTER — Encounter: Payer: Self-pay | Admitting: Psychiatry

## 2019-01-19 ENCOUNTER — Ambulatory Visit (INDEPENDENT_AMBULATORY_CARE_PROVIDER_SITE_OTHER): Payer: 59 | Admitting: Psychiatry

## 2019-01-19 DIAGNOSIS — F4312 Post-traumatic stress disorder, chronic: Secondary | ICD-10-CM | POA: Diagnosis not present
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
## 2019-01-27 ENCOUNTER — Ambulatory Visit: Payer: 59 | Admitting: Psychiatry

## 2019-02-03 ENCOUNTER — Encounter: Payer: Self-pay | Admitting: Psychiatry

## 2019-02-03 ENCOUNTER — Ambulatory Visit (INDEPENDENT_AMBULATORY_CARE_PROVIDER_SITE_OTHER): Payer: 59 | Admitting: Psychiatry

## 2019-02-03 ENCOUNTER — Other Ambulatory Visit: Payer: Self-pay

## 2019-02-03 DIAGNOSIS — F4312 Post-traumatic stress disorder, chronic: Secondary | ICD-10-CM

## 2019-02-03 NOTE — Progress Notes (Signed)
      [facility] Counselor/Therapist Progress Note  Patient ID: Diana Ayers, MRN: [id number],    Date: 02/03/2019  Time Spent: 50 minutes start time 11:10 AM end time 12 PM  Treatment Type: Individual Therapy  Reported Symptoms: grief issues, sadness, triggered responses  Mental Status Exam:  Appearance:   Well Groomed     Behavior:  Sharing  Motor:  Normal  Speech/Language:   Normal Rate  Affect:  Appropriate  Mood:  sad  Thought process:  normal  Thought content:    WNL  Sensory/Perceptual disturbances:    WNL  Orientation:  oriented to person, place, time/date and situation  Attention:  Good  Concentration:  Good  Memory:  WNL  Fund of knowledge:   Good  Insight:    Good  Judgment:   Good  Impulse Control:  Good   Risk Assessment: Danger to Self:  No Self-injurious Behavior: No Danger to Others: No Duty to Warn:no Physical Aggression / Violence:No  Access to Firearms a concern: No  Gang Involvement:No   Subjective: Patient was present for session.  She shared this is the month that her dad had the seizure that  Lead to his death and she was there and witnessed it.  She shared she has typically gone and done lots of things this month to not focus too much on it.  With COVID that is not possible and so she had a bad day.  Also, all that is going on in the world is upsetting her.   She recognized at this point she just doesn't want to focus on that any longer, so she is starting to let others know not to talk to her about all that is going on currently.  Patient went on to explain she is realizing she needs to get back into some form work to keep her brain engaged so it does not go in negative directions.  Patient also shared she is been able to meet a neighbor that also has some struggles so she is hoping that they can start walking together and supporting each other some.  Patient was encouraged to continue working on setting limits with her family especially her  mother who is still very triggering to her.  Patient is also to work on finding a new job and continuing to interact with others socially as possible during the situation so that she can keep her brain engaged in positive activity  Interventions: Solution-Oriented/Positive Psychology  Diagnosis:   ICD-10-CM   1. Chronic post-traumatic stress disorder (PTSD)  F43.12     Plan: Patient is to utilize CBT and coping skills to decrease triggered responses and manage emotions.  Patient is to set limits with family members and friends as needed to make sure she does not get overwhelmed and saddened by situations in the world.  Patient is also to continue working on exercising and trying to find work to be able to keep her brain engaged in positive activity Long-term goal: Develop and implement effective coping skills to carry out normal responsibilities and projects dissipate constructively in relationships Short-term goal: Practice implement relaxation training as a coping neck mechanism for tension panic stress anxiety and anger.  [name], [facility]

## 2019-02-10 ENCOUNTER — Ambulatory Visit: Payer: 59 | Admitting: Psychiatry

## 2019-02-17 ENCOUNTER — Encounter: Payer: Self-pay | Admitting: Psychiatry

## 2019-02-17 ENCOUNTER — Ambulatory Visit (INDEPENDENT_AMBULATORY_CARE_PROVIDER_SITE_OTHER): Payer: 59 | Admitting: Psychiatry

## 2019-02-17 ENCOUNTER — Other Ambulatory Visit: Payer: Self-pay

## 2019-02-17 DIAGNOSIS — F4312 Post-traumatic stress disorder, chronic: Secondary | ICD-10-CM | POA: Diagnosis not present

## 2019-02-17 NOTE — Progress Notes (Signed)
[facility] Counselor/Therapist Progress Note  Patient ID: Diana Ayers, MRN: [id number],    Date: 02/17/2019  Time Spent: 51 minutes start time 11:09 AM end time 12 PM  Treatment Type: Individual Therapy  Reported Symptoms: stressed, triggered responses, sadness  Mental Status Exam:  Appearance:   Well Groomed     Behavior:  Appropriate  Motor:  Normal  Speech/Language:   Normal Rate  Affect:  Appropriate  Mood:  anxious and sad  Thought process:  normal  Thought content:    Rumination  Sensory/Perceptual disturbances:    WNL  Orientation:  oriented to person, place, time/date and situation  Attention:  Good  Concentration:  Good  Memory:  WNL  Fund of knowledge:   Good  Insight:    Good  Judgment:   Good  Impulse Control:  Good   Risk Assessment: Danger to Self:  No Self-injurious Behavior: No Danger to Others: No Duty to Warn:no Physical Aggression / Violence:No  Access to Firearms a concern: No  Gang Involvement:No   Subjective: Patient was present for session.  She shared her mother went to the hospital this morning.  She was triggered by that.  She also shared this is the month when father died.  The 35 th 04-28-2022 and 05/02/22 are anniversary dates for her and she is on the blue side as she has been thinking about it.  Also reported that things continue to be very difficult with her mother.  She shared that her mood goes up and down and she talks a lot about death.  Patient reported she is struggling with what she needs to do to help her mom and where the boundaries are.  Patient also shared that her sister's blood pressure is very high and she is having health issues and her other sister has a mouse in her house and lots of other issues within her house.  Patient reported she recognizes that all the things are outside of her box and she needs to focus on what she can control fix and change.  She went on to share it is very difficult for her because she is the one  they look to to fix and handle everything and she typically tries not to let them know when she is feeling overwhelmed or sad.  Patient was encouraged to try and focus her thoughts in a more productive manner.  She was encouraged to recognize she may need to make some preparations to be gone for a few weeks if her mother's health continues to decline.  Discussed the importance of her having a schedule and routine to get into over the next few weeks and to figure out the projects that need to be addressed to give her the freedom to do what she needs to do to help her family if needed.  Patient was able to identify different things that are important she needs to take care of and agreed to try and focus her thoughts more on those than the things she cannot control fix and change.  Interventions: Cognitive Behavioral Therapy and Solution-Oriented/Positive Psychology  Diagnosis:   ICD-10-CM   1. Chronic post-traumatic stress disorder (PTSD)  F43.12     Plan: Patient is to utilize CBT and coping skills to manage emotions attached to triggered responses.  Patient is to work on focusing on the things she can control fix and change.  She is to figure out a schedule that can help her be more proactive about  doing things that are positive for her.  She is also to try and get her home ready for her to be gone for a few weeks just in case her family does need her.  Patient is to get back to exercising on a daily basis. Long-term goal: Develop and implement effective coping skills to carry out normal responsibilities and participate constructively in relationships Short-term goal: Practice implement relaxation training as a coping mechanism for tension panic stress anger and anxiety  [name], [facility]

## 2019-02-24 ENCOUNTER — Ambulatory Visit (INDEPENDENT_AMBULATORY_CARE_PROVIDER_SITE_OTHER): Payer: 59 | Admitting: Psychiatry

## 2019-02-24 DIAGNOSIS — F4310 Post-traumatic stress disorder, unspecified: Secondary | ICD-10-CM

## 2019-02-24 NOTE — Progress Notes (Signed)
[facility] Counselor/Therapist Progress Note  Patient ID: Diana Ayers, MRN: [id number],    Date: 02/25/2019  Time Spent: 52 minutes start time 11:04 AM end time 11:56 AM  Treatment Type: Individual Therapy  Reported Symptoms: anxiety, triggered responses, sadness, sleep issues  Mental Status Exam:  Appearance:   Casual     Behavior:  Appropriate  Motor:  Normal  Speech/Language:   Normal Rate  Affect:  Appropriate  Mood:  anxious  Thought process:  normal  Thought content:    WNL  Sensory/Perceptual disturbances:    WNL  Orientation:  oriented to person, place, time/date, situation and day of week  Attention:  Good  Concentration:  Good  Memory:  WNL  Fund of knowledge:   Good  Insight:    Good  Judgment:   Good  Impulse Control:  Good   Risk Assessment: Danger to Self:  No Self-injurious Behavior: No Danger to Others: No Duty to Warn:no Physical Aggression / Violence:No  Access to Firearms a concern: No  Gang Involvement:No   Subjective: Met with patient via virtual session through [employer].  She shared that her mother was in the hospital. She went on to explain  She has had to go to the hospital for days which was hard for her. She shared that her sister broke down and that has left her having to handle everything.  Patient was encouraged to feel good about the fact that her sister finally broke down and started talking to her because that meant that their relationship was moving in a positive direction even though it is been hard for her to deal with her mother.  Patient explained that she is trying hard to love her even when she is very difficult.  She still says things that are very triggering to the patient.  Patient was reminded that she has to balance her time with her mother with taking time to calm herself down and release her emotions in a positive manner.  She was reminded that her mother is not going to change and that she will always be about her  needs rather than patience.  Different ways to help patient deal with that appropriately were discussed in session.  Patient was encouraged to try and find ways to get her mother some help into her home since it is impossible for her to be there as much as her mother will need her to be.  Patient was also able to recognize that she does do a lot for her mom and that at the end of the day if anything were to happen she would note in her heart she did everything she could to take care of her mother appropriately and not have to have any guilt.  Patient agreed to work on releasing any negative emotions that are stirred up in an appropriate manner over the next few days while she is still staying with her mother.  Interventions: Solution-Oriented/Positive Psychology  Diagnosis:   ICD-10-CM   1. PTSD (post-traumatic stress disorder)  F43.10     Plan: Patient is to utilize CBT and coping skills to manage triggered emotions appropriately.  Patient is to take time to do her breathing exercises work on her visuals and to release any negative emotions through exercise that she needs to.  Patient is going to try and keep her brain engaged in positive things so that it does not start thinking about negative memories from the past. Long-term goal: Develop and implement  effective coping skills to carry out normal responsibilities and participate constructively in relationships Short-term goal: Practice implement relaxation training as a coping mechanism for tension panic stress anger and anxiety  [name], [facility]

## 2019-03-03 ENCOUNTER — Encounter: Payer: Self-pay | Admitting: Psychiatry

## 2019-03-03 ENCOUNTER — Other Ambulatory Visit: Payer: Self-pay

## 2019-03-03 ENCOUNTER — Ambulatory Visit (INDEPENDENT_AMBULATORY_CARE_PROVIDER_SITE_OTHER): Payer: 59 | Admitting: Psychiatry

## 2019-03-03 DIAGNOSIS — F4310 Post-traumatic stress disorder, unspecified: Secondary | ICD-10-CM | POA: Diagnosis not present

## 2019-03-03 NOTE — Progress Notes (Signed)
[facility] Counselor/Therapist Progress Note  Patient ID: Diana Ayers, MRN: [id number],    Date: 03/03/2019  Time Spent: 52 minutes start time 12:06 PM end time 12:58 PM  Treatment Type: Individual Therapy  Reported Symptoms: anxiety, triggered responses, frustration  Mental Status Exam:  Appearance:   Casual     Behavior:  Appropriate  Motor:  Normal  Speech/Language:   Normal Rate  Affect:  Appropriate  Mood:  irritable  Thought process:  normal  Thought content:    WNL  Sensory/Perceptual disturbances:    WNL  Orientation:  oriented to person, place, time/date and situation  Attention:  Good  Concentration:  Good  Memory:  WNL  Fund of knowledge:   Good  Insight:    Good  Judgment:   Good  Impulse Control:  Good   Risk Assessment: Danger to Self:  No Self-injurious Behavior: No Danger to Others: No Duty to Warn:no Physical Aggression / Violence:No  Access to Firearms a concern: No  Gang Involvement:No   Subjective: Patient was present for session.  She shared that she finally got to come home.  She shared that she had to stay longer after her mother's hospital stay due to having to have a COVID test and they had to wait for the results.  Patient explained that she did have a talk with her mother about their relationship. She shared that her mother told her she intimidated her, patient shared she was able to talk to her mother about it.  Patient shared she was still struggling with that word intimidate, she was encouraged to recognize that probably it is more she challenges her mother because she is the 1 that encourages her to make better choices and to follow through on the things that the doctors tell her to do.  Also discussed the fact that she was the one that went through the most traumas with her mother which is also very triggering for her and her mother.  The other dynamic is that patient is a retired Engineer, structural and her mother had negative  interactions with the police with her great and her DWIs so that probably is another reason there is that dynamic with patient and her mother.  Patient was encouraged to try and remind herself of the facts and that all she can do is focused on being the best she can for her mom and that is what she does.  She was encouraged to journal the things that service and to remind herself that she just has to love her mom and not expect there to be another dynamic between them.  Patient agreed to work on that over the next few weeks and at this point she will probably distance from her mom a little bit to keep her self at a good place.  Interventions: Solution-Oriented/Positive Psychology  Diagnosis:   ICD-10-CM   1. PTSD (post-traumatic stress disorder)  F43.10     Plan: Patient is to utilize CBT and coping skills to manage triggered emotions appropriately.  Patient is to get back to doing some things for herself including exercising and continuing to take in her quiet times and doing her studies with her group. Long-term goal: Develop and implement effective coping skills to carry out normal responsibilities and participate constructively in relationships Short-term goal: Practice implement relaxation training as a coping mechanism for tension panic anger and anxiety   [name], [facility]

## 2019-03-11 ENCOUNTER — Ambulatory Visit (INDEPENDENT_AMBULATORY_CARE_PROVIDER_SITE_OTHER): Payer: 59 | Admitting: Psychiatry

## 2019-03-11 DIAGNOSIS — F4310 Post-traumatic stress disorder, unspecified: Secondary | ICD-10-CM

## 2019-03-11 NOTE — Progress Notes (Signed)
[facility] Counselor/Therapist Progress Note  Patient ID: Diana Ayers, MRN: [id number],    Date: 03/13/2019  Time Spent: 50 minutes start time 11:07 AM end time 11:57 AM Virtual Visit via Telephone Note Connected with patient by a video enabled telemedicine/telehealth application, with their informed consent, and verified patient privacy and that I am speaking with the correct person using two identifiers. I discussed the limitations, risks, security and privacy concerns of performing psychotherapy and management service by telephone and the availability of in person appointments. I also discussed with the patient that there may be a patient responsible charge related to this service. The patient expressed understanding and agreed to proceed. I discussed the treatment planning with the patient. The patient was provided an opportunity to ask questions and all were answered. The patient agreed with the plan and demonstrated an understanding of the instructions. The patient was advised to call  our office if  symptoms worsen or feel they are in a crisis state and need immediate contact.   Therapist Location: office Patient Location: home    Treatment Type: Individual Therapy  Reported Symptoms: anxiety, guilt, sadness, triggered responses  Mental Status Exam:  Appearance:   Casual     Behavior:  Appropriate  Motor:  Normal  Speech/Language:   Normal Rate  Affect:  Appropriate  Mood:  sad  Thought process:  normal  Thought content:    WNL  Sensory/Perceptual disturbances:    WNL  Orientation:  oriented to person, place, time/date and situation  Attention:  Good  Concentration:  Good  Memory:  WNL  Fund of knowledge:   Good  Insight:    Good  Judgment:   Good  Impulse Control:  Good   Risk Assessment: Danger to Self:  No Self-injurious Behavior: No Danger to Others: No Duty to Warn:no Physical Aggression / Violence:No  Access to Firearms a concern: No  Gang  Involvement:No   Subjective: Met with patient via virtual session through [employer].  She shared she has had someone staying with her for the past 3 weeks due to a COVID situation.  She shared that it has created some issues in the house.  She is trying to work through things and maintain a positive attitude.  Patient stated she is using her self talk to try and help her keep herself at a good place.  Patient went on to explain that her biggest concern is still her mother.  Patient shared she is realizing she needs to give her mother some space because she gets very triggered after spending too much time with her mother and her mother continues to tell her that she wants space.  Patient went on to explain that she is gotten a time job because realizes she needs to structure to keep her mind engaged in more positive things.  She went on to share that she is feeling very guilty that because she feels she needs to be there for her mom still.  Patient was encouraged to recognize the fact that she has to respect her mother's point of view and if she is sad that she needs space she needs to be willing to give that to her even if it goes against what she feels she needs to do.  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  Patient explained she wants to get out of that role but it feels like it is what she is supposed to do.  She went on to share that her older sister reports feeling the same way and recognizes that she also was a caretaker for her mother's of her life as well.  Patient was encouraged that it will take time to remind herself to change that thought patterns will be important to start getting the right messages into her brain if she is ever to make any changes.  Encourage patient to write affirmations up on her mirror at home and repeat them every day.  Patient was also encouraged to  let her mother know that she is his job so if she needs her to do something she will have to work around that schedule.  Interventions: Solution-Oriented/Positive Psychology, CBT  Diagnosis:   ICD-10-CM   1. PTSD (post-traumatic stress disorder)  F43.10     Plan: Patient is to use CBT and coping skills to decrease triggered responses and emotions.  Patient is to work on her affirmations regularly, by writing them on her mirror in her bathroom.  Patient is also to say on a better schedule with her new job and to let her family know that she is taken a job and will be not as available to them. Long-term goal: Develop and implement effective coping skills to carry out normal responsibilities and participate constructively in relationships Short-term goal: Practice implement relaxation training as a coping mechanism for tension panic stress anger and anxiety  [name], [facility]

## 2019-03-17 ENCOUNTER — Ambulatory Visit (INDEPENDENT_AMBULATORY_CARE_PROVIDER_SITE_OTHER): Payer: 59 | Admitting: Psychiatry

## 2019-03-17 DIAGNOSIS — F4310 Post-traumatic stress disorder, unspecified: Secondary | ICD-10-CM | POA: Diagnosis not present

## 2019-03-17 NOTE — Progress Notes (Signed)
[facility] Counselor/Therapist Progress Note  Patient ID: Diana Ayers, MRN: [id number],    Date: 03/17/2019  Time Spent: 50 minutes start time 11:10 AM end time 12 PM Virtual Visit via Telephone Note Connected with patient by a video enabled telemedicine/telehealth application, with their informed consent, and verified patient privacy and that I am speaking with the correct person using two identifiers. I discussed the limitations, risks, security and privacy concerns of performing psychotherapy and management service by telephone and the availability of in person appointments. I also discussed with the patient that there may be a patient responsible charge related to this service. The patient expressed understanding and agreed to proceed. I discussed the treatment planning with the patient. The patient was provided an opportunity to ask questions and all were answered. The patient agreed with the plan and demonstrated an understanding of the instructions. The patient was advised to call  our office if  symptoms worsen or feel they are in a crisis state and need immediate contact.   Therapist Location: office Patient Location: outside   Treatment Type: Individual Therapy  Reported Symptoms: anxiety, sadness, frustration, triggered responses  Mental Status Exam:  Appearance:   Casual     Behavior:  Sharing  Motor:  Normal  Speech/Language:   Normal Rate  Affect:  Appropriate  Mood:  anxious  Thought process:  normal  Thought content:    WNL  Sensory/Perceptual disturbances:    WNL  Orientation:  oriented to person, place, time/date and situation  Attention:  Good  Concentration:  Good and Fair  Memory:  WNL  Fund of knowledge:   Good  Insight:    Good  Judgment:   Good  Impulse Control:  Good   Risk Assessment: Danger to Self:  No Self-injurious Behavior: No Danger to Others: No Duty to Warn:no Physical Aggression / Violence:No  Access to Firearms a concern:  No  Gang Involvement:No   Subjective: Met with patient via virtual session through [employer].  She shared that she is trying to find a way to get outside more to try and help her mood. She reported her sister had an MRI completed like she had prior to getting her mastectomy.  Patient shared she was tired of all the biopsies and cutting.  She went on to share there was a spot in her left breast. She went on to share that she is trying to go to [place] over the weekend since it is looking she will get to start work soon. She also shared that her sister and her mother have had 2 bad confrontations and she was not sure how to handle that. Encouraged patient to spend time with her sister and mother separately so that she does not  Get triggered.  Was encouraged to spend time with her mother since it has been on her heart and at last session she was explaining some guilt that she was feeling.  She was encouraged to continue to remind herself that she does not have to fix her mother or their relationship and to know that her m as much of that time as possible.  Other is very triggering for her so she has to take time to herself.  Patient agreed that felt like the best plan for her so she would be planning a trip to go.  She shared that the rest of the day she wanted to spend it doing something to relax and get her mind off of things and  that is soothing for her so she would be fishing and outside to give herself a good perspective because she gets grounded when she can have that time.  Patient was encouraged to make sure she gets as much of that as possible  Interventions: Solution-Oriented/Positive Psychology  Diagnosis:   ICD-10-CM   1. PTSD (post-traumatic stress disorder)  F43.10     Plan: Patient is to use CBT and coping skills to manage triggered emotions appropriately.  Patient is to go down and spend time with her mother and sister.  Patient is to take time to get outside and go fishing to release negative  emotions in an appropriate manner and get grade herself grounded Long term goal: Develop and implement effective coping skills to carry out normal responsibilities and participate constructively in relationships Short term goal: Active implement relaxation training as a coping mechanism for tension panic stress anger and anxiety   [name], [facility]

## 2019-04-01 ENCOUNTER — Ambulatory Visit (INDEPENDENT_AMBULATORY_CARE_PROVIDER_SITE_OTHER): Payer: 59 | Admitting: Psychiatry

## 2019-04-01 ENCOUNTER — Other Ambulatory Visit: Payer: Self-pay

## 2019-04-01 DIAGNOSIS — F4310 Post-traumatic stress disorder, unspecified: Secondary | ICD-10-CM | POA: Diagnosis not present

## 2019-04-01 NOTE — Progress Notes (Signed)
      [facility] Counselor/Therapist Progress Note  Patient ID: NAUREEN DRACE, MRN: [id number],    Date: 04/01/2019  Time Spent: 52 minutes start time 10:07 AM end time 10:59 AM  Treatment Type: Individual Therapy  Reported Symptoms: stressed, triggered responses  Mental Status Exam:  Appearance:   Casual and Neat     Behavior:  Sharing  Motor:  Normal  Speech/Language:   Normal Rate  Affect:  Appropriate  Mood:  anxious  Thought process:  normal  Thought content:    WNL  Sensory/Perceptual disturbances:    WNL  Orientation:  oriented to person, place, time/date and situation  Attention:  Good  Concentration:  Good  Memory:  WNL  Fund of knowledge:   Good  Insight:    Good  Judgment:   Good  Impulse Control:  Good   Risk Assessment: Danger to Self:  No Self-injurious Behavior: No Danger to Others: No Duty to Warn:no Physical Aggression / Violence:No  Access to Firearms a concern: No  Gang Involvement:No   Subjective: Patient was present for session.  She reported she is stressed due not having a job and her mother's health. She explained that her mother had to go back in the hospital. She shared that she spent the night with her mother and she had an episode in the middle of the night. She shared that she has this fear of her dying in the middle of the night when she is there.  Patient went on to share that her mother finally agreed to go into a rehab unit.  She is hopeful to get out within a week.  She has to have somebody at home with her when she gets out and she used her words and asked patient to spend the night with her.  Encourage patient to recognize the amount of progress that means is occurring within their relationship and in the way that she is dealing with her mother.  Patient was reminded it was not even a month ago that she told patient that she intimidated her.  Now she is asking for her help to recover from what is going on.  Patient was encouraged to  feel positive about the progress and to remind herself regularly that things are moving in a good direction.  She was encouraged to write them down so she can keep them accessible to read when she starts having doubt  Interventions: Solution-Oriented/Positive Psychology  Diagnosis:   ICD-10-CM   1. PTSD (post-traumatic stress disorder)  F43.10     Plan: Patient is to use CBT and coping skills to manage triggered emotions.  Patient is to continue to follow plans to interact with her mother in appropriate ways with limit setting.  Patient is to continue fishing and exercising to release emotions appropriately Long term goal: Develop and implement effective coping skills to carry out normal responsibilities and participate constructively in relationships Short term goal: Practice implement relaxation training as a coping mechanism for tension panic stress anger and anxiety [name], [facility]

## 2019-04-14 ENCOUNTER — Ambulatory Visit (INDEPENDENT_AMBULATORY_CARE_PROVIDER_SITE_OTHER): Payer: 59 | Admitting: Psychiatry

## 2019-04-14 DIAGNOSIS — F4310 Post-traumatic stress disorder, unspecified: Secondary | ICD-10-CM

## 2019-04-14 NOTE — Progress Notes (Signed)
[facility] Counselor/Therapist Progress Note  Patient ID: Diana Ayers, MRN: [id number],    Date: 04/14/2019  Time Spent: 50 minutes start time 2:11 PM end time 3:01 PM Virtual Visit via Telephone Note Connected with patient by a video enabled telemedicine/telehealth application or telephone, with their informed consent, and verified patient privacy and that I am speaking with the correct person using two identifiers. I discussed the limitations, risks, security and privacy concerns of performing psychotherapy and management service by telephone and the availability of in person appointments. I also discussed with the patient that there may be a patient responsible charge related to this service. The patient expressed understanding and agreed to proceed. I discussed the treatment planning with the patient. The patient was provided an opportunity to ask questions and all were answered. The patient agreed with the plan and demonstrated an understanding of the instructions. The patient was advised to call  our office if  symptoms worsen or feel they are in a crisis state and need immediate contact.   Therapist Location: office Patient Location: home    Treatment Type: Individual Therapy  Reported Symptoms: anxiety, triggered responses  Mental Status Exam:  Appearance:   NA     Behavior:  Sharing  Motor:  na  Speech/Language:   Normal Rate  Affect:  NA  Mood:  na  Thought process:  normal  Thought content:    WNL  Sensory/Perceptual disturbances:    WNL  Orientation:  oriented to person, place, time/date and situation  Attention:  Good  Concentration:  Good  Memory:  WNL  Fund of knowledge:   Good  Insight:    Good  Judgment:   Good  Impulse Control:  Good   Risk Assessment: Danger to Self:  No Self-injurious Behavior: No Danger to Others: No Duty to Warn:no Physical Aggression / Violence:No  Access to Firearms a concern: No  Gang Involvement:No    Subjective: Met with patient via phone could not get virtual session to connect correctly. She explained that her mother called and was not doing well so had to go to [place] and take care of her. Patient stated she got there and her mother seems like she is having some confusion and memory issues. She stated she had a talk with the nurses coming to her home and let them know that they needed to do a better job about taking care of her. She  Was able to get an explanation of blood pressure so she could better help her mother. Patient shared that she tried to advocate for her mother but she fought through it.  Patient is going to be staying with her until she was stable. She was able to confront her mother on her inappropriate behavior.  Patient was encouraged to feel positive about the way things are changing in her relationship with her mother and the fact that she is doing everything she can to make things better for her so she does not have to have any guilt.  Patient was encouraged to recognize when she gets triggered and to work on journaling.  Interventions: Solution-Oriented/Positive Psychology  Diagnosis:   ICD-10-CM   1. PTSD (post-traumatic stress disorder)  F43.10     Plan: Patient is to use CBT and coping skills to manage triggered emotions appropriately.  Patient is to work on journaling and exercise to release negative emotions in an appropriate manner.  Patient is to continue working on setting limits with her mother appropriately.  Long term goal: Develop and implement effective coping skills to carry out normal responsibilities and participate constructively in relationships Short term goal: Practice, implement  Relaxation training for tension, panic, anxiety stress, and anger  [name], [facility]

## 2019-04-21 ENCOUNTER — Ambulatory Visit (INDEPENDENT_AMBULATORY_CARE_PROVIDER_SITE_OTHER): Payer: 59 | Admitting: Psychiatry

## 2019-04-21 ENCOUNTER — Other Ambulatory Visit: Payer: Self-pay

## 2019-04-21 DIAGNOSIS — F4310 Post-traumatic stress disorder, unspecified: Secondary | ICD-10-CM | POA: Diagnosis not present

## 2019-04-21 NOTE — Progress Notes (Signed)
      [facility] Counselor/Therapist Progress Note  Patient ID: Diana Ayers, MRN: [id number],    Date: 04/21/2019  Time Spent: 52 minutes start time 10:05 AM end time 10:57 PM  Treatment Type: Individual Therapy  Reported Symptoms: anxiety, triggered responses  Mental Status Exam:  Appearance:   Well Groomed     Behavior:  Appropriate  Motor:  Normal  Speech/Language:   Normal Rate  Affect:  Appropriate  Mood:  normal  Thought process:  normal  Thought content:    WNL  Sensory/Perceptual disturbances:    WNL  Orientation:  oriented to person, place, time/date and situation  Attention:  Good  Concentration:  Good  Memory:  WNL  Fund of knowledge:   Good  Insight:    Good  Judgment:   Good  Impulse Control:  Good   Risk Assessment: Danger to Self:  No Self-injurious Behavior: No Danger to Others: No Duty to Warn:no Physical Aggression / Violence:No  Access to Firearms a concern: No  Gang Involvement:No   Subjective: Patient was present for session.  She shared that she is still very stressed due to her mother. She went on to share that she is getting home health for her and that has helped but she is still being difficult.   Patient stated she is going back to her book on Narcissistic mothers and she is realizing she has to start focusing on her self care and having limits with her mother. Patient was encouraged to think through different ways to set limits and take care of herself.  Patient was reminded of all that she has done for her mother and that she doesn't have anything to feel guilty about so when she starts to feel she needs to do something that she does not want to she can use her CBT filters to maintain perspective. She also shared that she had to pick her up off the ground and it brought back lots of traumatic memories from childhood.  Agreed to try and work on them at next session.   Interventions: Solution-Oriented/Positive Psychology and Eye Movement  Desensitization and Reprocessing (EMDR)  Diagnosis:   ICD-10-CM   1. PTSD (post-traumatic stress disorder)  F43.10     Plan: Patient is to use CBT and coping skills to decrease emotions.  She is to set some limits with her mother and sisters when appropriate.  She is also to work on self care through diet exercise and turning off her phone at night. Long term goal: Develop and implement effective coping skills to carry out normal responsibilities and participate constructively in relationships  Short term goal: Practice implement relaxations training as a coping mechanism for tension, panic, anxiety, anger, and stress  [name], [name]

## 2019-04-29 ENCOUNTER — Ambulatory Visit (INDEPENDENT_AMBULATORY_CARE_PROVIDER_SITE_OTHER): Payer: 59 | Admitting: Psychiatry

## 2019-04-29 DIAGNOSIS — F4312 Post-traumatic stress disorder, chronic: Secondary | ICD-10-CM | POA: Diagnosis not present

## 2019-04-29 NOTE — Progress Notes (Signed)
[facility] Counselor/Therapist Progress Note  Patient ID: NDEA KILROY, MRN: [id number],    Date: 05/02/2019  Time Spent: 52 minutes start time 12:06 PM end time 12:58 PM Virtual Visit via Telephone Note Connected with patient by a video enabled telemedicine/telehealth application, with their informed consent, and verified patient privacy and that I am speaking with the correct person using two identifiers. I discussed the limitations, risks, security and privacy concerns of performing psychotherapy and management service by telephone and the availability of in person appointments. I also discussed with the patient that there may be a patient responsible charge related to this service. The patient expressed understanding and agreed to proceed. I discussed the treatment planning with the patient. The patient was provided an opportunity to ask questions and all were answered. The patient agreed with the plan and demonstrated an understanding of the instructions. The patient was advised to call  our office if  symptoms worsen or feel they are in a crisis state and need immediate contact.   Therapist Location: office Patient Location: home    Treatment Type: Individual Therapy  Reported Symptoms: anxiety triggered responses, memory issues, frustration  Mental Status Exam:  Appearance:   Casual     Behavior:  Sharing  Motor:  Normal  Speech/Language:   Normal Rate  Affect:  Appropriate  Mood:  anxious  Thought process:  normal  Thought content:    WNL  Sensory/Perceptual disturbances:    WNL  Orientation:  oriented to person, place, time/date and situation  Attention:  Good  Concentration:  Good  Memory:  Immediate;   [place] of knowledge:   Good  Insight:    Good  Judgment:   Good  Impulse Control:  Good   Risk Assessment: Danger to Self:  No Self-injurious Behavior: No Danger to Others: No Duty to Warn:no Physical Aggression / Violence:No  Access to  Firearms a concern: No  Gang Involvement:No   Subjective: Met with patient via virtual session through webex.  She shared that her mother was back in the hospital due to cardiac issues. Patient shared that she was able to talk with her doctors which was a good thing. She went on to share that her mother's cat is also sick and she has been dealing with that situation from home as well.  Currently, her mother is in rehab and she is hopeful she will be staying there for a while to get her strength back.  She has been able to set some limits with her mother which is positive.  She did share she has been able to do some things for self care.  She went on a short trip away, she went fishing, and she was able to get to [place].  Was encouraged to recognize the fact that she will continue to deal with the situation with her mother and trying to balance leaving her mom and setting limits is very difficult.  The importance of continuing her self-care and communicating things with her siblings was discussed with patient.  She was encouraged to recognize the fact that when she needs to rest she needs to rest.  Also she had started turning her phone back on in the evenings which she was asked not to do that so she can rest the sides when she takes her sleep medication she cannot drive so she agreed to contact her siblings and let them know that her family would be off again and she would not  answer text messages or phone calls until 8 AM  Interventions: Solution-Oriented/Positive Psychology  Diagnosis:   ICD-10-CM   1. Chronic post-traumatic stress disorder (PTSD)  F43.12     Plan: Patient is to use CBT and coping skills to manage triggered emotions appropriately.  Patient is to turn off her phone from 11 PM until 8 AM and to let her siblings know that her friend will be off as well so she can sleep the way she needs to.  Patient is to work on limit setting with her mother and to communicate consistently with her  sisters. Long-term goal: Develop and implement effective coping skills to carry out normal responsibilities and participate constructively in relationships Short-term goal: Practice implement relaxation training as a coping mechanism for tension panic stress anger and anxiety  [name], [facility]

## 2019-05-13 ENCOUNTER — Encounter: Payer: Self-pay | Admitting: Psychiatry

## 2019-05-13 ENCOUNTER — Other Ambulatory Visit: Payer: Self-pay

## 2019-05-13 ENCOUNTER — Ambulatory Visit (INDEPENDENT_AMBULATORY_CARE_PROVIDER_SITE_OTHER): Payer: 59 | Admitting: Psychiatry

## 2019-05-13 DIAGNOSIS — F4312 Post-traumatic stress disorder, chronic: Secondary | ICD-10-CM | POA: Diagnosis not present

## 2019-05-13 NOTE — Progress Notes (Signed)
      [facility] Counselor/Therapist Progress Note  Patient ID: CRISLYNN POLKINGHORNE, MRN: [id number],    Date: 05/13/2019  Time Spent: 52 minutes start time 11:06 AM end time 11:58 AM  Treatment Type: Individual Therapy  Reported Symptoms: anxiety, triggered response, sadness  Mental Status Exam:  Appearance:   Well Groomed     Behavior:  Appropriate  Motor:  Normal  Speech/Language:   Normal Rate  Affect:  Congruent  Mood:  anxious  Thought process:  normal  Thought content:    Rumination  Sensory/Perceptual disturbances:    WNL  Orientation:  oriented to person, place, time/date and situation  Attention:  Good  Concentration:  Good  Memory:  WNL  Fund of knowledge:   Good  Insight:    Good  Judgment:   Good  Impulse Control:  Good   Risk Assessment: Danger to Self:  No Self-injurious Behavior: No Danger to Others: No Duty to Warn:no Physical Aggression / Violence:No  Access to Firearms a concern: No  Gang Involvement:No   Subjective: Patient was present for session.  She shared that she is stressed.  She explained that things continue to be difficult with her mother. She went on to share she is not sure about her cognitions. She explained the people at rehab states she is doing great and than when she is there she is pitiful and wanting her to push her around in the wheelchair.  Patient reported she is not sure how to manage things with her mom because she is always very critical.  Had patient do EMDR set on mom being critical, suds level 8, negative cognition "I do not matter" felt hurt in her chest.  Patient was able to reduce suds level to 5.  Patient was encouraged to remind herself that she can set limits with her mother in an appropriate manner and that her mother is not capable of seeing other people's points of view at this time so she can expect empathy from her.  Patient was also encouraged to work on releasing negative emotions appropriately through  exercise  Interventions: Solution-Oriented/Positive Psychology and [employer] Desensitization and Reprocessing (EMDR)  Diagnosis:   ICD-10-CM   1. Chronic post-traumatic stress disorder (PTSD)  F43.12     Plan: Patient is to use CBT and coping skills to decrease negative emotions.  She is to exercise regularly to release emotions appropriately.  Patient is to continue setting limits with her mother appropriately. Long-term goal: Develop and implement effective coping skills to carry out normal responsibilities and participate constructively in relationships Short-term goal: Practice implement relaxation training as a coping mechanism for tension panic stress anger and anxiety  [name], [facility]

## 2019-05-26 ENCOUNTER — Other Ambulatory Visit: Payer: Self-pay

## 2019-05-26 ENCOUNTER — Ambulatory Visit (INDEPENDENT_AMBULATORY_CARE_PROVIDER_SITE_OTHER): Payer: 59 | Admitting: Psychiatry

## 2019-05-26 DIAGNOSIS — F4312 Post-traumatic stress disorder, chronic: Secondary | ICD-10-CM | POA: Diagnosis not present

## 2019-05-26 NOTE — Progress Notes (Signed)
      [facility] Counselor/Therapist Progress Note  Patient ID: Diana Ayers, MRN: [id number],    Date: 05/26/2019  Time Spent: 51 minutes start time 1:09 PM end time 2 PM  Treatment Type: Individual Therapy  Reported Symptoms: anxiety, triggered responses, sadness, frustration  Mental Status Exam:  Appearance:   Well Groomed     Behavior:  Appropriate  Motor:  Normal  Speech/Language:   Normal Rate  Affect:  Appropriate  Mood:  anxious  Thought process:  normal  Thought content:    WNL  Sensory/Perceptual disturbances:    WNL  Orientation:  oriented to person, place, time/date and situation  Attention:  Good  Concentration:  Good  Memory:  WNL  Fund of knowledge:   Good  Insight:    Good  Judgment:   Good  Impulse Control:  Good   Risk Assessment: Danger to Self:  No Self-injurious Behavior: No Danger to Others: No Duty to Warn:no Physical Aggression / Violence:No  Access to Firearms a concern: No  Gang Involvement:No   Subjective: Patient was present for session.  She continues to create issues and problems for patient.  She shared that she did go pick her mother up from rehab and it was a mess.  Mom took her cat out of the boarding place and it started having lots of issues and he ended up dying. Patient reported it was hard for her due to it being her father's cat.  Patient explained that she worked hard to make sure her mother had to make the decision because she recognized the fact that she would put it back on her at some point however was not a positive thing.  Patient was encouraged to recognize she is handling her mother much more appropriately and she is doing what she can for her.  Patient stated she is still struggling with some guilt and frustration over the whole situation.  She recognizes that her sisters are not doing well either because they are all feeling very worn with all the up-and-down drama of her mother's health situations and just the way she  treats them.  Patient was encouraged to continue reminding herself of the truth and to taking breaks as needed from her mother because she is very toxic.  Patient was considering going down for Mother's Day, since she is concerned this could be her last 1 and she does not want to live life with regrets.  Discussed the importance of focusing on her self-care if she chooses to do that and ways to make the situation more tolerable so that she does not get as triggered.  Interventions: Cognitive Behavioral Therapy and Solution-Oriented/Positive Psychology  Diagnosis:   ICD-10-CM   1. Chronic post-traumatic stress disorder (PTSD)  F43.12     Plan: Patient is to use CBT and coping skills to manage triggered emotions appropriately.  Patient is to focus on her self-care including taking time to exercise and fish whenever she can to relax.  Patient is to continue setting limits with her mother and limiting her contact as needed. Long-term goal: Develop and implement effective coping skills to carry out normal responsibilities and participate constructively in relationships Short-term goal: Practice implement relaxation training as a coping mechanism for tension panic stress anger and anxiety  [name], [facility]

## 2019-06-08 ENCOUNTER — Ambulatory Visit: Payer: 59 | Admitting: Psychiatry

## 2019-06-16 ENCOUNTER — Ambulatory Visit (INDEPENDENT_AMBULATORY_CARE_PROVIDER_SITE_OTHER): Payer: 59 | Admitting: Psychiatry

## 2019-06-16 DIAGNOSIS — F4310 Post-traumatic stress disorder, unspecified: Secondary | ICD-10-CM

## 2019-06-16 NOTE — Progress Notes (Signed)
      [facility] Counselor/Therapist Progress Note  Patient ID: Diana Ayers, MRN: [id number],    Date: 06/16/2019  Time Spent: 51 minutes start time 1:02 PM end time 1:53 PM  Treatment Type: Individual Therapy  Reported Symptoms: Sadness, anxiety, triggered responses, guilt, frustration  Mental Status Exam:  Appearance:   Well Groomed     Behavior:  Appropriate  Motor:  Normal  Speech/Language:   Normal Rate  Affect:  Appropriate  Mood:  irritable  Thought process:  normal  Thought content:    WNL  Sensory/Perceptual disturbances:    WNL  Orientation:  oriented to person, place, time/date and situation  Attention:  Good  Concentration:  Good  Memory:  WNL  Fund of knowledge:   Good  Insight:    Good  Judgment:   Good  Impulse Control:  Good   Risk Assessment: Danger to Self:  No Self-injurious Behavior: No Danger to Others: No Duty to Warn:no Physical Aggression / Violence:No  Access to Firearms a concern: No  Gang Involvement:No   Subjective: Patient was present for session.  Patient reported that she had gone away to the beach with her sister and her roommate and it was gone really well until they got a call from her mother.  Patient explained that her mother asked her to come back by on her way home from her trip because she was having issues again.  Patient stated she went back to see her mother and it was very difficult.  She shared that her mother finally told her to leave because she was asking her questions to try and understand what was going on with her.  Patient reported she feels guilty still about the situation with her mother.  Had patient do EMDR set on her mother telling her to leave, suds level 10, negative cognition "it is my fault" felt guilt in her body.  Patient was able to reduce suds level and to realize that her mother is responsible for her own choices.  Patient shared that since she was a child she has been tricked and betrayed by her mother  into taking care of her and feeling responsible for her.  Patient was encouraged to recognize the fact that that whole time her mother made her own choices and she needs to deal with her own consequences.  Patient was able to recognize the fact that it is time to allow her mother to deal with the consequences of her long-term choices.  Ways for her to talk herself through that as she gets triggered were discussed in session.  Interventions: Solution-Oriented/Positive Psychology and Eye Movement Desensitization and Reprocessing (EMDR)  Diagnosis:   ICD-10-CM   1. PTSD (post-traumatic stress disorder)  F43.10     Plan: Patient is to use CBT and coping skills to decrease triggered responses.  Patient is to continue working on finding things to do to relax including fishing and exercising.  Patient is to work on setting healthy limits with her mother and allowing her to feel the consequences of her negative choices. Long-term goal: Develop and implement effective coping skills to carry out normal responsibilities and participate constructively in relationships Short-term goal: Practice implement relaxation training as a coping mechanism for tension panic stress anger and anxiety  [name], [facility]

## 2019-06-24 ENCOUNTER — Telehealth: Payer: Self-pay | Admitting: Psychiatry

## 2019-06-24 ENCOUNTER — Ambulatory Visit (INDEPENDENT_AMBULATORY_CARE_PROVIDER_SITE_OTHER): Payer: 59 | Admitting: Psychiatry

## 2019-06-24 DIAGNOSIS — F4310 Post-traumatic stress disorder, unspecified: Secondary | ICD-10-CM | POA: Diagnosis not present

## 2019-06-24 NOTE — Telephone Encounter (Signed)
Ms. june, rode are scheduled for a virtual visit with your provider today.    Just as we do with appointments in the office, we must obtain your consent to participate.  Your consent will be active for this visit and any virtual visit you may have with one of our providers in the next 365 days.    If you have a MyChart account, I can also send a copy of this consent to you electronically.  All virtual visits are billed to your insurance company just like a traditional visit in the office.  As this is a virtual visit, video technology does not allow for your provider to perform a traditional examination.  This may limit your provider's ability to fully assess your condition.  If your provider identifies any concerns that need to be evaluated in person or the need to arrange testing such as labs, EKG, etc, we will make arrangements to do so.    Although advances in technology are sophisticated, we cannot ensure that it will always work on either your end or our end.  If the connection with a video visit is poor, we may have to switch to a telephone visit.  With either a video or telephone visit, we are not always able to ensure that we have a secure connection.   I need to obtain your verbal consent now.   Are you willing to proceed with your visit today?   KAMILIA CAROLLO has provided verbal consent on 06/24/2019 for a virtual visit (video or telephone).   [name], [facility] 06/24/2019  9:10 AM

## 2019-06-24 NOTE — Progress Notes (Signed)
[facility] Counselor/Therapist Progress Note  Patient ID: Diana Ayers, MRN: [id number],    Date: 06/24/2019  Time Spent: 51 minutes start time 9:07 AM end time 9:58 AM Virtual Visit via Telephone Note Connected with patient by a video enabled telemedicine/telehealth application, with their informed consent, and verified patient privacy and that I am speaking with the correct person using two identifiers. I discussed the limitations, risks, security and privacy concerns of performing psychotherapy and management service by telephone and the availability of in person appointments. I also discussed with the patient that there may be a patient responsible charge related to this service. The patient expressed understanding and agreed to proceed. I discussed the treatment planning with the patient. The patient was provided an opportunity to ask questions and all were answered. The patient agreed with the plan and demonstrated an understanding of the instructions. The patient was advised to call  our office if  symptoms worsen or feel they are in a crisis state and need immediate contact.   Therapist Location: office Patient Location: mother's home    Treatment Type: Individual Therapy  Reported Symptoms: anxiety, triggered responses, low motivation  Mental Status Exam:  Appearance:   Casual     Behavior:  Appropriate  Motor:  Normal  Speech/Language:   Normal Rate  Affect:  Appropriate  Mood:  anxious  Thought process:  normal  Thought content:    WNL  Sensory/Perceptual disturbances:    WNL  Orientation:  oriented to person, place, time/date and situation  Attention:  Good  Concentration:  Good  Memory:  Immediate;   [place] of knowledge:   Good  Insight:    Good  Judgment:   Good  Impulse Control:  Good   Risk Assessment: Danger to Self:  No Self-injurious Behavior: No Danger to Others: No Duty to Warn:no Physical Aggression / Violence:No  Access to Firearms  a concern: No  Gang Involvement:No   Subjective: Met with patient via virtual session.  She shared she is at her mother's home.  She is there for her mother and her  Nephew's graduation.  She is trying to figure out what symptoms she is having are worth giving energy to and which ones she has to allow her to just have. She started that she is realizing her mother just does not have empathy for others so there is no need for her to try and share her issues with her mother.  She is trying to remind herself it is not personnel it is just her.  Patient was encouraged to recognize the progress she is made by being able to disconnect in a healthy manner from her mother at the same time love her and trying to make her needs in an appropriate manner.  Patient was encouraged to continue on that journey and to continue trying to balance taking care of herself and being able to love her mother.  Patient was encouraged to get back to exercising and her self-care.  She shared she is realizing she is not been able to exercise as much and is feeling the emotions of that.  Patient was also encouraged to write out the things that she does positively for her mom so if anything happens she can remember them.  Interventions: Solution-Oriented/Positive Psychology  Diagnosis:   ICD-10-CM   1. PTSD (post-traumatic stress disorder)  F43.10     Plan: Patient is to use CBT and coping skills to decrease triggered responses.  Patient is to work on exercising and journaling to release negative emotions in a positive manner.  Patient is to continue setting limits with her mother in appropriate manner. Long-term goal: Develop and implement effective coping skills to carry out normal responsibilities and participate constructively in relationships Short-term goal: Practice implement relaxation training as a coping mechanism for tension panic stress anger and anxiety  [name], [facility]

## 2019-06-30 ENCOUNTER — Ambulatory Visit (INDEPENDENT_AMBULATORY_CARE_PROVIDER_SITE_OTHER): Payer: 59 | Admitting: Psychiatry

## 2019-06-30 ENCOUNTER — Other Ambulatory Visit: Payer: Self-pay

## 2019-06-30 DIAGNOSIS — F4310 Post-traumatic stress disorder, unspecified: Secondary | ICD-10-CM

## 2019-06-30 NOTE — Progress Notes (Signed)
      [facility] Counselor/Therapist Progress Note  Patient ID: Diana Ayers, MRN: [id number],    Date: 06/30/2019  Time Spent: 52 minutes start time 11:11 AM end time 12:03 PM  Treatment Type: Individual Therapy  Reported Symptoms: anxiety, sadness, triggered responses  Mental Status Exam:  Appearance:   Casual and Neat     Behavior:  Appropriate  Motor:  Normal  Speech/Language:   Normal Rate  Affect:  Appropriate  Mood:  sad  Thought process:  normal  Thought content:    WNL  Sensory/Perceptual disturbances:    WNL  Orientation:  oriented to person, place, time/date and situation  Attention:  Good  Concentration:  Good  Memory:  WNL  Fund of knowledge:   Good  Insight:    Good  Judgment:   Good  Impulse Control:  Good   Risk Assessment: Danger to Self:  No Self-injurious Behavior: No Danger to Others: No Duty to Warn:no Physical Aggression / Violence:No  Access to Firearms a concern: No  Gang Involvement:No   Subjective: Patient is present for session.  She shared that her mother is back in the hospital.  She went on to share that her mother's doctor has let her know that she is not doing well and progressing negatively.  She explained she is feeling a lot of sadness because she knows her mother is dying and wants to be able to love her through the process but her mother is still very resistant.  Discussed different resources that she may be able to use to help her be able to help her mother more.  Encouraged her to contact the caseworker and have her think through ideas as well as communicate with her mother with her so they can develop a plan for when she is discharged from the hospital again.  Patient was encouraged to remind herself that she has done all she can for her mother and that she does not need to take on any guilt.  Also encouraged her to get support from her sisters so they can communicate the same things to her mother as well.  Patient was given  different CBT handouts on cognitive distortions and ways to manage stress as well as other negative emotions appropriately.  Patient agreed to work on handouts and to try and follow plans developed in session.  Interventions: Cognitive Behavioral Therapy and Solution-Oriented/Positive Psychology  Diagnosis:   ICD-10-CM   1. PTSD (post-traumatic stress disorder)  F43.10     Plan: Patient is to use CBT and coping skills to decrease triggered emotions.  Patient is to use handouts to help her manage negative emotions appropriately.  Patient is to follow plans developed in session to deal with her mother's medical situation. Long-term goal: Develop and implement effective coping skills to carry out normal responsibilities and participate constructively in relationships Short-term goal: Practice implement relaxation training as a coping mechanism for tension panic stress anger and anxiety  [name], [facility]

## 2019-07-14 ENCOUNTER — Ambulatory Visit: Payer: 59 | Admitting: Psychiatry

## 2019-08-18 ENCOUNTER — Other Ambulatory Visit: Payer: Self-pay

## 2019-08-18 ENCOUNTER — Ambulatory Visit (INDEPENDENT_AMBULATORY_CARE_PROVIDER_SITE_OTHER): Payer: 59 | Admitting: Psychiatry

## 2019-08-18 DIAGNOSIS — F4310 Post-traumatic stress disorder, unspecified: Secondary | ICD-10-CM
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
## 2019-08-23 ENCOUNTER — Ambulatory Visit (INDEPENDENT_AMBULATORY_CARE_PROVIDER_SITE_OTHER): Payer: 59 | Admitting: Psychiatry

## 2019-08-23 DIAGNOSIS — F4310 Post-traumatic stress disorder, unspecified: Secondary | ICD-10-CM

## 2019-08-23 NOTE — Progress Notes (Signed)
[facility] Counselor/Therapist Progress Note  Patient ID: Diana Ayers, MRN: [id number],    Date: 08/23/2019  Time Spent: 51 minutes start time 11:07 AM end time 11:58 AM Virtual Visit via Telephone Note Connected with patient by a video enabled telemedicine/telehealth application, with their informed consent, and verified patient privacy and that I am speaking with the correct person using two identifiers. I discussed the limitations, risks, security and privacy concerns of performing psychotherapy and management service by telephone and the availability of in person appointments. I also discussed with the patient that there may be a patient responsible charge related to this service. The patient expressed understanding and agreed to proceed. I discussed the treatment planning with the patient. The patient was provided an opportunity to ask questions and all were answered. The patient agreed with the plan and demonstrated an understanding of the instructions. The patient was advised to call  our office if  symptoms worsen or feel they are in a crisis state and need immediate contact.   Therapist Location: office Patient Location: home    Treatment Type: Individual Therapy  Reported Symptoms: anxiety, sadness, triggered responses, sleep issues  Mental Status Exam:  Appearance:   Casual and Neat     Behavior:  Appropriate  Motor:  Normal  Speech/Language:   Normal Rate  Affect:  Appropriate  Mood:  anxious and sad  Thought process:  normal  Thought content:    WNL  Sensory/Perceptual disturbances:    WNL  Orientation:  oriented to person, place, time/date and situation  Attention:  Good  Concentration:  Good  Memory:  WNL  Fund of knowledge:   Good  Insight:    Good  Judgment:   Good  Impulse Control:  Good   Risk Assessment: Danger to Self:  No Self-injurious Behavior: No Danger to Others: No Duty to Warn:no Physical Aggression / Violence:No  Access to  Firearms a concern: No  Gang Involvement:No   Subjective: Met with patient via virtual session.  She shared that she was having a hard time due to her sight not getting better and she is back in her house hoping that the tear in her retina will heal.  She shared that she is starting to be impacted by the cumulative effect of the quarantine due to the pandemic and now in the same situation due to her eye.  She stated she has to sleep in her recliner and it is not good sleep due to being in the den and being woke up.  She went on to share she is hoping to get someone to move the chair in her room so that she can rest better.  Patient was encouraged to make sure she does have someone to help her move her recliner into her bedroom so that she can get the type the rest that she truly needs.  Patient was reminded that sleep is a huge thing with her PTSD and she needs to be safe and comfortable.  Patient also shared she is wanting to know if she needs to go ahead and have the surgery on her retina since there is still the possibility of a third tear then she would have to have it.  Patient finally recognize that she needs to call the office of the doctor and discussed the pros and cons of each situation to help her make a positive situation.  Patient acknowledged she does much better when she has information and she can progress from  there.  Patient was also encouraged to find some things that she can do in her situation to keep her brain engaged in more appropriate things.  She is going to try to connect more for time with friends eating because that is all she can do at this point.  She can also go for walks so she is going to try and walk in her neighborhood and see if she can find some connection through that activity as well.  Interventions: Solution-Oriented/Positive Psychology  Diagnosis:   ICD-10-CM   1. PTSD (post-traumatic stress disorder)  F43.10     Plan: Patient is to utilize CBT and coping skills  to decrease triggered responses.  Patient is to work on finding ways to keep her brain engaged in positive things while she is having to be still.  Patient is to consult with her physician about how to proceed with her eye situation.  Patient is to get someone to help her move her recliner into her bedroom. Long-term goal: Develop and implement effective coping skills to carry out normal responsibilities and participate constructively in relationships Short-term goal: Practice implement relaxation training as a coping mechanism for tension panic stress anger and anxiety  [name], [facility]

## 2019-08-24 ENCOUNTER — Ambulatory Visit: Payer: 59 | Admitting: Psychiatry

## 2019-09-01 ENCOUNTER — Ambulatory Visit (INDEPENDENT_AMBULATORY_CARE_PROVIDER_SITE_OTHER): Payer: 59 | Admitting: Psychiatry

## 2019-09-01 DIAGNOSIS — F4310 Post-traumatic stress disorder, unspecified: Secondary | ICD-10-CM | POA: Diagnosis not present
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
## 2019-09-15 ENCOUNTER — Ambulatory Visit (INDEPENDENT_AMBULATORY_CARE_PROVIDER_SITE_OTHER): Payer: 59 | Admitting: Psychiatry

## 2019-09-15 ENCOUNTER — Other Ambulatory Visit: Payer: Self-pay

## 2019-09-15 DIAGNOSIS — F4310 Post-traumatic stress disorder, unspecified: Secondary | ICD-10-CM

## 2019-09-15 NOTE — Progress Notes (Signed)
[facility] Counselor/Therapist Progress Note  Patient ID: Diana Ayers, MRN: [id number],    Date: 09/15/2019  Time Spent: 50 minutes start time 10:08 AM end time 11 AM  Treatment Type: Individual Therapy  Reported Symptoms: anxiety, sadness, dreams, sleep issues  Mental Status Exam:  Appearance:   Well Groomed     Behavior:  Appropriate  Motor:  Normal  Speech/Language:   Normal Rate  Affect:  Appropriate  Mood:  normal  Thought process:  normal  Thought content:    WNL  Sensory/Perceptual disturbances:    WNL  Orientation:  oriented to person, place, time/date and situation  Attention:  Good  Concentration:  Good  Memory:  WNL  Fund of knowledge:   Good  Insight:    Good  Judgment:   Good  Impulse Control:  Good   Risk Assessment: Danger to Self:  No Self-injurious Behavior: No Danger to Others: No Duty to Warn:no Physical Aggression / Violence:No  Access to Firearms a concern: No  Gang Involvement:No   Subjective: Patient was present for session.  She shared that she had her eye surgery and it went well.  She was able to come out of her surgery without a panic attack which was a good thing. She shared that she had good support during the process which was positive.  Patient shared that she is continuing to try and focus on putting positive things into her head on a regular basis.  She is listening to different books on tape to help focus on her purpose.  Discussed the fact that even though she is been through lots of traumas in her life she is still coming to the other side and she can use those to help others.  Patient shared that her desire is to bring something good from the bad and she is working in baby steps to make that happen.  Patient explained that she was able to handle a situation with her mother in a way she felt good about.  The situation would have typically triggered their past and patient's PTSD issues but she was able to handle it without  getting emotional.  Patient was encouraged to feel good about the work that she is doing and to recognize that it is helping her to move in a more positive direction.  Patient was also encouraged to recognize some of the sleep issues could be tied to the fact that she is unable to have the physical activity she is typically used to because of her eye surgery.  Patient stated she has another week before she can be released and is hopeful that after that she can get back to her fishing, and other activities on a more regular basis.  Patient also shared that she is doing a book study with her roommate and another friend which she is hopeful will also be working on positive thoughts.  Interventions: Cognitive Behavioral Therapy and Solution-Oriented/Positive Psychology  Diagnosis:   ICD-10-CM   1. PTSD (post-traumatic stress disorder)  F43.10     Plan: Patient is to use CBT and coping skills to decrease triggered responses.  Patient is to continue focusing on listening to positive books on tape.  She is participate in a book study.  Patient is to get back to normal physical activity once she is released from her physician to release emotions appropriately Long-term goal: Develop and implement effective coping skills to carry out normal responsibilities and participate constructively in relationships Short-term goal:  Practice implement relaxation training as a coping mechanism for tension panic stress anger and anxiety  [name], [facility]

## 2019-09-29 ENCOUNTER — Ambulatory Visit (INDEPENDENT_AMBULATORY_CARE_PROVIDER_SITE_OTHER): Payer: 59 | Admitting: Psychiatry

## 2019-09-29 ENCOUNTER — Other Ambulatory Visit: Payer: Self-pay

## 2019-09-29 DIAGNOSIS — F4310 Post-traumatic stress disorder, unspecified: Secondary | ICD-10-CM | POA: Diagnosis not present

## 2019-09-29 NOTE — Progress Notes (Signed)
°      [facility] Counselor/Therapist Progress Note  Patient ID: Diana Ayers, MRN: [id number],    Date: 09/30/2019  Time Spent: 52 minutes 11:06 AM end time 11:58 AM  Treatment Type: Individual Therapy  Reported Symptoms: anxiety, triggered responses, sadness, sleep issues, memory issues, difficulty making decisions  Mental Status Exam:  Appearance:   Well Groomed     Behavior:  Appropriate  Motor:  Normal  Speech/Language:   Normal Rate  Affect:  Appropriate  Mood:  anxious  Thought process:  normal  Thought content:    WNL  Sensory/Perceptual disturbances:    WNL  Orientation:  oriented to person, place, time/date and situation  Attention:  Good  Concentration:  Good  Memory:  Immediate;   [place] of knowledge:   Good  Insight:    Good  Judgment:   Good  Impulse Control:  Good   Risk Assessment: Danger to Self:  No Self-injurious Behavior: No Danger to Others: No Duty to Warn:no Physical Aggression / Violence:No  Access to Firearms a concern: No  Gang Involvement:No   Subjective: Patient was present for session.  She reported that she is going to take a small trip with her mother.  Patient acknowledged that that could be very triggering for her and she is concerned.  Discussed the importance of her focusing on ways to take care of herself during the trip.  Patient was encouraged to recognize that she is doing a very positive thing and if anything were to happen to her mother she would now know she is done everything she can to give her mother what she needed during her time with her.  Patient was encouraged to take time to disconnect from any of the negative triggering thoughts that may surface while she interacts with her mother who can be very toxic.  Patient was also encouraged to focus on the things that she can control fix and change.  Patient reported feeling that she had a positive plan at the end of session.  She explained that she is hopeful that things  will go positively but recognizes she can only do the best she can with her mom and that is enough.  Patient also shared she is recognizing she is ready to go back to work for the winter and fall seasons.  She shared she is trying to be more proactive about her seasonal depression and recognizes she needs more social interaction.  Discussed different possibilities for work and the importance of her recognizing her strengths and gifts.  Interventions: Cognitive Behavioral Therapy and Solution-Oriented/Positive Psychology  Diagnosis:   ICD-10-CM   1. PTSD (post-traumatic stress disorder)  F43.10     Plan: Patient is to work on CBT and coping skills to decrease triggered responses.  Patient is to follow plans to interact with her mother while they are on their trip together.  Patient is to focus on the things that she can control fix and change.  Patient is to start looking for a job when she returns from her trip with her mother. Long term goal: Develop and implement effective coping skills to carry out normal responsibilities and participate constructively in relationships Short term goal: Practice, implement relaxation training as a coping mechanism for tension, panic, stress, anger, and anxiety   [name], [facility]

## 2019-10-13 ENCOUNTER — Ambulatory Visit (INDEPENDENT_AMBULATORY_CARE_PROVIDER_SITE_OTHER): Payer: 59 | Admitting: Psychiatry

## 2019-10-13 ENCOUNTER — Other Ambulatory Visit: Payer: Self-pay

## 2019-10-13 DIAGNOSIS — F4310 Post-traumatic stress disorder, unspecified: Secondary | ICD-10-CM

## 2019-10-13 NOTE — Progress Notes (Signed)
[facility] Counselor/Therapist Progress Note  Patient ID: ANAIH BRANDER, MRN: [id number],    Date: 10/13/2019  Time Spent: 50 minutes start time 11:13 AM end time 12:03 PM  Treatment Type: Individual Therapy  Reported Symptoms: triggered responses, sadness, anxiety  Mental Status Exam:  Appearance:   Casual and Neat     Behavior:  Appropriate  Motor:  Normal  Speech/Language:   Normal Rate  Affect:  Appropriate  Mood:  normal  Thought process:  normal  Thought content:    WNL  Sensory/Perceptual disturbances:    WNL  Orientation:  oriented to person, place, time/date and situation  Attention:  Good  Concentration:  Good  Memory:  WNL  Fund of knowledge:   Good  Insight:    Good  Judgment:   Good  Impulse Control:  Good   Risk Assessment: Danger to Self:  No Self-injurious Behavior: No Danger to Others: No Duty to Warn:no Physical Aggression / Violence:No  Access to Firearms a concern: No  Gang Involvement:No   Subjective: Patient was present for session.  She shared that she got a job and that is good.  She went on to share that her mother has a cold and is blaming it on their trip because the air was colder than she wanted.  Patient stated she has been able to talk herself through it and not taking it personal which was progress.  Patient shared that things are going on with her family but she is keeping everything in perspective. Niece has COVID but she is doing okay. She was able to realize when her mother was asking her to care for her while they were gone the way she did took her back to childhood, when she was having to care that way for her mother when she had a hangover.  Did EMDR on her mother's critical spirit, suds level 10, negative cognition "I cannot do enough to make her happy" felt frustration in her chest.  Patient was able to reduce suds level to 4.  She was able to recognize the fact that she cannot make her mother happy because her mother has  to make herself happy.  Patient was able to think through CBT filters to help her interact with her mom even when she is in a negative place so that it does not cause patient to get triggered or have her mood decrease.  Patient reported feeling positive at the end of session and agreed to focus on her own self-care by working and fishing.  Interventions: Solution-Oriented/Positive Psychology and Eye Movement Desensitization and Reprocessing (EMDR)  Diagnosis:   ICD-10-CM   1. PTSD (post-traumatic stress disorder)  F43.10     Plan: Patient is to use CBT and coping skills to decrease depression symptoms.  Patient is to work on her self talk when interacting with her mother so that she does not allow her negativity to impact her.  Patient is to take on a seasonal position and continue fishing.  Patient is to take medication as directed to manage triggered responses appropriately.  Patient is to get back in exercising and continuing to participate in her Bible studies. Long-term goal: Develop and implement effective coping skills to carry out normal responsibilities and participate constructively in relationships Short-term goal: Practice implement relaxation training as a coping mechanism for tension panic stress anger and anxiety  [name], [facility]

## 2019-10-20 ENCOUNTER — Encounter: Payer: Self-pay | Admitting: Psychiatry

## 2019-10-20 ENCOUNTER — Other Ambulatory Visit: Payer: Self-pay

## 2019-10-20 ENCOUNTER — Ambulatory Visit (INDEPENDENT_AMBULATORY_CARE_PROVIDER_SITE_OTHER): Payer: 59 | Admitting: Psychiatry

## 2019-10-20 VITALS — BP 144/92 | HR 89 | Ht 62.0 in | Wt 166.0 lb

## 2019-10-20 DIAGNOSIS — F32A Depression, unspecified: Secondary | ICD-10-CM

## 2019-10-20 DIAGNOSIS — F329 Major depressive disorder, single episode, unspecified: Secondary | ICD-10-CM | POA: Diagnosis not present

## 2019-10-20 DIAGNOSIS — F4310 Post-traumatic stress disorder, unspecified: Secondary | ICD-10-CM
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
## 2019-10-27 ENCOUNTER — Ambulatory Visit (INDEPENDENT_AMBULATORY_CARE_PROVIDER_SITE_OTHER): Payer: 59 | Admitting: Psychiatry

## 2019-10-27 ENCOUNTER — Other Ambulatory Visit: Payer: Self-pay

## 2019-10-27 DIAGNOSIS — F4310 Post-traumatic stress disorder, unspecified: Secondary | ICD-10-CM | POA: Diagnosis not present
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
## 2019-11-10 ENCOUNTER — Ambulatory Visit: Payer: 59 | Admitting: Psychiatry

## 2019-11-15 ENCOUNTER — Ambulatory Visit: Payer: Self-pay | Admitting: Podiatry

## 2019-11-16 ENCOUNTER — Ambulatory Visit: Payer: Self-pay | Admitting: Podiatry

## 2019-11-17 ENCOUNTER — Ambulatory Visit: Payer: 59 | Admitting: Podiatry

## 2019-11-17 ENCOUNTER — Ambulatory Visit (INDEPENDENT_AMBULATORY_CARE_PROVIDER_SITE_OTHER): Payer: 59

## 2019-11-17 ENCOUNTER — Other Ambulatory Visit: Payer: Self-pay

## 2019-11-17 ENCOUNTER — Telehealth: Payer: Self-pay | Admitting: Psychiatry

## 2019-11-17 DIAGNOSIS — R5383 Other fatigue: Secondary | ICD-10-CM

## 2019-11-17 DIAGNOSIS — M722 Plantar fascial fibromatosis: Secondary | ICD-10-CM | POA: Diagnosis not present

## 2019-11-17 DIAGNOSIS — M7731 Calcaneal spur, right foot: Secondary | ICD-10-CM

## 2019-11-17 DIAGNOSIS — Z79899 Other long term (current) drug therapy: Secondary | ICD-10-CM

## 2019-11-17 MED ORDER — MELOXICAM 15 MG PO TABS: 15.0000 mg | ORAL_TABLET | Freq: Every day | ORAL | 3 refills | Status: DC

## 2019-11-17 NOTE — Telephone Encounter (Signed)
Is it okay I contact her regarding this first?

## 2019-11-17 NOTE — Telephone Encounter (Signed)
Patient called and said she needed to ask Janett Billow and Earnest Bailey a "private medical message. I'm sending this to you first. If I need to send both of them a message I will. Thanks

## 2019-11-17 NOTE — Patient Instructions (Addendum)

## 2019-11-17 NOTE — Progress Notes (Signed)
  Subjective:  Patient ID: Diana Ayers, female    DOB: Apr 20, 1960,  MRN: [id number]  Chief Complaint  Patient presents with  . Foot Pain    Right heel pain 6-7 months     59 y.o. female presents with the above complaint. History confirmed with patient.  She is a retired Engineer, structural.  She has had this before and is treated it with stretching and shoe gear changes.  She currently wears [place] shoes and these have helped but she still has persistent pain.  She has an over-the-counter night splint that she ordered from [place] and she has been wearing this  Objective:  Physical Exam: warm, good capillary refill, no trophic changes or ulcerative lesions, normal DP and PT pulses and normal sensory exam.   Right Foot: Sharp pain on palpation to the plantar heel, none in the mid plantar fascia, no evidence of plantar fibroma   Radiographs: X-ray of the right foot: Plantar calcaneal spur is noted Assessment:   1. Plantar fasciitis of right foot   2. Calcaneal spur of right foot      Plan:  Patient was evaluated and treated and all questions answered.  Discussed the etiology and treatment options for plantar fasciitis including stretching, formal physical therapy, supportive shoegears such as a running shoe or sneaker, pre fabricated orthoses, injection therapy, and oral medications. We also discussed the role of surgical treatment of this for patients who do not improve after exhausting non-surgical treatment options.    -XR reviewed with patient -Educated patient on stretching and icing of the affected limb -Plantar fascial brace dispensed -Injection delivered to the plantar fascia of the right foot. -Rx for meloxicam. Educated on use, risks and benefits of the medication  -Continue night splint  After sterile prep with povidone-iodine solution and alcohol, the right heel was injected with 0.5cc 2% xylocaine plain, 0.5cc 0.5% marcaine plain, 5mg  triamcinolone acetonide, and  2mg  dexamethasone was injected along  the plantar fascia at the insertion on the plantar calcaneus. The patient tolerated the procedure well without complication.   No follow-ups on file.

## 2019-11-17 NOTE — Telephone Encounter (Signed)
Pt called back to add that she is not doing well at all. Pt wants to talk about getting some blood work done. Please call.

## 2019-11-18 ENCOUNTER — Telehealth: Payer: Self-pay | Admitting: Psychiatry

## 2019-11-18 NOTE — Telephone Encounter (Signed)
Patient returned my call, she reports feeling more fatigue, no motivation or desire to do much. Not sure if it's related to holidays coming up or some anniversaries this month. She did discuss her eye surgery over the summer and felt like she missed out on her summer. She's requesting to get some labs drawn, the last psychiatrist she changed to was in 2019 and labs never got completed, then pandemic occurred. She's asking for TSH, and complete blood panel if possible. She reports she use to get labs prior with her previous psychiatrist. Doesn't know if something is off physically or mentally. She does take thyroid medication as well. She mentioned her PCP is out until 2022 for some reason.    She would go to [facility] on [address] if labs ordered. Also mentioned we could mail the order or she could pick up if wanting to.    She also reports she is waiting to hear back from [place]? Not sure what specifically.

## 2019-11-18 NOTE — Telephone Encounter (Signed)
See other phone message  

## 2019-11-18 NOTE — Telephone Encounter (Signed)
Patient aware of labs orders. She said she couldn't pick it up today, She will check and see if [facility] is open on weekends, if not she will go on Monday. Probably go ahead and mail her a copy of the order as well.

## 2019-11-18 NOTE — Telephone Encounter (Addendum)
Returned patient's call.  She shared that she feels off mentally and physically. She shared she feels like she is slipping. Discussed different things that could be behind the emotions.  Patient shared that she has an order in to get blood work and she feels positive about that.  Agreed to work patient in next week we will contact her on Monday to discuss possible appointment.

## 2019-11-18 NOTE — Telephone Encounter (Signed)
Left message to call back  

## 2019-11-18 NOTE — Telephone Encounter (Signed)
Left message to call back, there are 2 messages out currently. Will close out the other message and document on this one.

## 2019-11-18 NOTE — Addendum Note (Signed)
Addended by: Sharyl Nimrod on: 11/18/2019 11:58 AM   Modules accepted: Orders

## 2019-11-22 LAB — COMPREHENSIVE METABOLIC PANEL
AG Ratio: 2 (calc) (ref 1.0–2.5)
ALT: 14 U/L (ref 6–29)
AST: 16 U/L (ref 10–35)
Albumin: 4.7 g/dL (ref 3.6–5.1)
Alkaline phosphatase (APISO): 108 U/L (ref 37–153)
BUN: 11 mg/dL (ref 7–25)
CO2: 30 mmol/L (ref 20–32)
Calcium: 9.9 mg/dL (ref 8.6–10.4)
Chloride: 101 mmol/L (ref 98–110)
Creat: 0.65 mg/dL (ref 0.50–0.99)
Globulin: 2.3 g/dL (calc) (ref 1.9–3.7)
Glucose, Bld: 98 mg/dL (ref 65–139)
Potassium: 4.3 mmol/L (ref 3.5–5.3)
Sodium: 138 mmol/L (ref 135–146)
Total Bilirubin: 0.4 mg/dL (ref 0.2–1.2)
Total Protein: 7 g/dL (ref 6.1–8.1)

## 2019-11-22 LAB — CBC
HCT: 42.8 % (ref 35.0–45.0)
Hemoglobin: 14.6 g/dL (ref 11.7–15.5)
MCH: 31.9 pg (ref 27.0–33.0)
MCHC: 34.1 g/dL (ref 32.0–36.0)
MCV: 93.4 fL (ref 80.0–100.0)
MPV: 12.4 fL (ref 7.5–12.5)
Platelets: 210 10*3/uL (ref 140–400)
RBC: 4.58 10*6/uL (ref 3.80–5.10)
RDW: 11.8 % (ref 11.0–15.0)
WBC: 5.5 10*3/uL (ref 3.8–10.8)

## 2019-11-22 LAB — CARBAMAZEPINE LEVEL, TOTAL: Carbamazepine Lvl: 8.4 mg/L (ref 4.0–12.0)

## 2019-11-22 LAB — TSH: TSH: 1.86 mIU/L (ref 0.40–4.50)

## 2019-11-22 LAB — VITAMIN D 25 HYDROXY (VIT D DEFICIENCY, FRACTURES): Vit D, 25-Hydroxy: 23 ng/mL — ABNORMAL LOW (ref 30–100)

## 2019-11-22 LAB — VITAMIN B12: Vitamin B-12: 363 pg/mL (ref 200–1100)

## 2019-11-23 ENCOUNTER — Other Ambulatory Visit: Payer: Self-pay

## 2019-11-23 ENCOUNTER — Ambulatory Visit (INDEPENDENT_AMBULATORY_CARE_PROVIDER_SITE_OTHER): Payer: 59 | Admitting: Psychiatry

## 2019-11-23 DIAGNOSIS — F4310 Post-traumatic stress disorder, unspecified: Secondary | ICD-10-CM | POA: Diagnosis not present
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
## 2019-12-01 ENCOUNTER — Other Ambulatory Visit: Payer: Self-pay

## 2019-12-01 ENCOUNTER — Ambulatory Visit (INDEPENDENT_AMBULATORY_CARE_PROVIDER_SITE_OTHER): Payer: 59 | Admitting: Psychiatry

## 2019-12-01 DIAGNOSIS — F4310 Post-traumatic stress disorder, unspecified: Secondary | ICD-10-CM

## 2019-12-01 NOTE — Progress Notes (Signed)
      [facility] Counselor/Therapist Progress Note  Patient ID: FARREN LANDA, MRN: [id number],    Date: 12/01/2019  Time Spent: 50 minutes start time 11:11 AM end time 12:01 PM  Treatment Type: Individual Therapy  Reported Symptoms: depression, sleep issues, fatigue, triggered responses  Mental Status Exam:  Appearance:   Casual and Neat     Behavior:  Appropriate  Motor:  Normal  Speech/Language:   Normal Rate  Affect:  Appropriate  Mood:  sad  Thought process:  normal  Thought content:    WNL  Sensory/Perceptual disturbances:    WNL  Orientation:  oriented to person, place, time/date and situation  Attention:  Good  Concentration:  Good  Memory:  WNL  Fund of knowledge:   Good  Insight:    Good  Judgment:   Good  Impulse Control:  Good   Risk Assessment: Danger to Self:  No Self-injurious Behavior: No Danger to Others: No Duty to Warn:no Physical Aggression / Violence:No  Access to Firearms a concern: No  Gang Involvement:No   Subjective: Patient was present for session.  She shared she is still struggling with her depression.  She explained that she is just getting tired of bad things and she is being weighed down by [place], dysfunctional family, and health issues.  Allowed patient time to share concerns and frustrations.  Encouraged her to think through ways to find joy and even in very simple things to the holidays.  Patient was reminded that she seems to do better when she is volunteering and interacting with others.  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  Patient acknowledged that brought her joy seeing the patients and connecting with them.  Encouraged patient to consider making holiday cards for different residents in the nursing home or talking was staff about gifts that she can bring different people.  Patient reported feeling  positive about that plan and thinking that that might help bring her some joy in this holiday season.  Patient was also encouraged to remind herself to keep doing what she needs to do to get her vitamin D levels back up to normal and to take her medication as directed.  She was reminded that there could be some seasonal depression going on and she needs to just keep working on getting outside and releasing emotions in a positive manner.  Encourage patient to remember her box and to focus on what she can control fix and change  Interventions: Cognitive Behavioral Therapy and Solution-Oriented/Positive Psychology  Diagnosis:   ICD-10-CM   1. PTSD (post-traumatic stress disorder)  F43.10     Plan: Patient is to use CBT and coping skills to decrease triggered responses.  Patient is to practice her box theory and focus on the things that she can control fix and change.  Patient is to look into volunteer options to give her something to look forward to concerning the holiday.  Patient is to get outside and release negative emotions appropriately Long-term goal: Develop and implement effective coping skills to carry out normal responsibilities and participate constructively in relationships Short-term goal: Practice implement relaxation training as a coping mechanism for tension panic stress anger and anxiety  [name], [facility]

## 2019-12-09 ENCOUNTER — Telehealth: Payer: Self-pay

## 2019-12-09 NOTE — Telephone Encounter (Signed)
Patient called nurse asking about the letter she received in the mail, it was written personally she said and instructed patient to get labs drawn before next visit. Patient just recently had labs so she thought that was odd especially since her apt is 12/20/2019. Informed her I was not sure where that came from but I would certainty check with Janett Billow to confirm. She also asked if she does need to if a Hgb A1C could be added, she has been on the pre diabetic end and just want to check status. She also mentioned that [facility] discussed with Janett Billow how much Vitamin D to take daily and she reported back 2,000 IU. She was wondering why she couldn't take a higher one like 5,000 IU? Informed her I was not sure of that reason and didn't see anything documented about that.   Informed her I would check on Janett Billow and give her a call back with information.

## 2019-12-10 NOTE — Telephone Encounter (Signed)
Reviewed thanks! 

## 2019-12-13 ENCOUNTER — Ambulatory Visit (INDEPENDENT_AMBULATORY_CARE_PROVIDER_SITE_OTHER): Payer: 59 | Admitting: Psychiatry

## 2019-12-13 ENCOUNTER — Other Ambulatory Visit: Payer: Self-pay

## 2019-12-13 DIAGNOSIS — F331 Major depressive disorder, recurrent, moderate: Secondary | ICD-10-CM

## 2019-12-13 NOTE — Progress Notes (Signed)
[facility] Counselor/Therapist Progress Note  Patient ID: Diana Ayers, MRN: [id number],    Date: 12/13/2019  Time Spent: 52 minutes start time 11:07 AM end time 11:59 AM  Treatment Type: Individual Therapy  Reported Symptoms: depression, loss of interest, fatigue, triggered responses  Mental Status Exam:  Appearance:   Casual and Neat     Behavior:  Appropriate  Motor:  Normal  Speech/Language:   Normal Rate  Affect:  Appropriate  Mood:  sad  Thought process:  normal  Thought content:    WNL  Sensory/Perceptual disturbances:    WNL  Orientation:  oriented to person, place, time/date and situation  Attention:  Good  Concentration:  Good  Memory:  WNL  Fund of knowledge:   Good  Insight:    Good  Judgment:   Good  Impulse Control:  Good   Risk Assessment: Danger to Self:  No Self-injurious Behavior: No Danger to Others: No Duty to Warn:no Physical Aggression / Violence:No  Access to Firearms a concern: No  Gang Involvement:No   Subjective: Patient was present for session.  She shared that her depression was worse.  Developed treatment plan to address her depression.  Did not sign due to COVID and need for social distancing.  She went on to share she told her family that she did not need to hear about anymore bad news currently.  She also asked that they start stating things that they are thankful for in their daily text.   Patient shared she is working more hours recently which is tiring for her.  Had patient think through what she was doing that was bringing joy into her life.  She shared she has not been able to fish and she is had her eye surgery in July and that has been a huge loss in her life.  She shared currently she cannot due to the weather.  Also discussed the fact that she is not having as much social contact with the [place] situation.  Patient's roommate has been working a job that is different hours in her job so she has not even had as much time  with her.  Discussed the importance of patient getting back to doing some of the things that she enjoys and having more interaction with others.  She had explained even though she is working she spends a lot of her time alone.  Patient went on to share she is going to be with her family for Thanksgiving and that is something she is looking forward to.  Encouraged her to find more ways to interact and to start writing out something positive each day to keep her brain focused more on the positive reminded her that thoughts lead to feelings lead to behaviors.  Interventions: Cognitive Behavioral Therapy and Solution-Oriented/Positive Psychology  Diagnosis:   ICD-10-CM   1. Major depressive disorder, recurrent episode, moderate ([place])  F33.1     Plan: Patient is to use CBT and coping skills to decrease depression symptoms.  Patient is to work on finding ways to have more joy in her life including interacting more with others.  Patient is to write down something positive each day to remind herself of the good things.  Patient is to continue in her Bible study and work on exercising daily. Long term goal: Elevate mood and show evidence of usual energy  Activities and social level. Short term goal: Identify and replace depressive thinking that leads to depressive feelings and actions  Diana Ayers, [facility]

## 2019-12-20 ENCOUNTER — Other Ambulatory Visit: Payer: Self-pay

## 2019-12-20 ENCOUNTER — Ambulatory Visit (INDEPENDENT_AMBULATORY_CARE_PROVIDER_SITE_OTHER): Payer: 59 | Admitting: Psychiatry

## 2019-12-20 ENCOUNTER — Encounter: Payer: Self-pay | Admitting: Psychiatry

## 2019-12-20 DIAGNOSIS — F32A Depression, unspecified: Secondary | ICD-10-CM

## 2019-12-20 MED ORDER — DEPLIN 15 15-90.314 MG PO CAPS: 15.0000 mg | ORAL_CAPSULE | Freq: Every day | ORAL | 0 refills | Status: DC

## 2019-12-20 NOTE — Progress Notes (Signed)
Diana Ayers [id number] [birth year] 59 y.o.  Subjective:   Patient ID:  Diana Ayers is a 60 y.o. (DOB 1960/10/06) female.  Chief Complaint:  Chief Complaint  Patient presents with  . Depression  . Fatigue  . Anxiety    HPI PURITY IRMEN presents to the office today for follow-up of depression, anxiety, and insomnia. She reports that she has had severe fatigue. She reports that she is "maybe 10% better" compared to last visit.   She reports that she has been setting limits with family in terms of not talking about negative events that do not involve people that she knows. Has been trying to pray and read her Bible more to help with anxiety.   She reports that sleep has not been as good recently. She reports that she falls asleep without difficulty and has middle of the night awakenings and has difficulty returning to sleep. Averaging about 5 hours of sleep a night. Has had some anxiety in response to family stressors and neighbor having CVA. She reports rumination and racing thoughts. She reports that she has to try to focus at work. Reports some exaggerated startle response when customers approach her. Motivation has been low and reports that on her days off she prefers to rest. Has been using caffeine more to help get through work. She reports that she has been eating an adequate amount and has been craving sweets. Has not been interested in usual activities. Denies SI.   She reports that usually from mid-October through January she typically wishes she could "just go to sleep"  Since holidays are so difficult in her family.   Has been working part-time at [employer] and has been working over 35 hours a week. Has been working rotating shifts.   Neighbor of 24 years had CVA yesterday. Mother has been hospitalized 5 times this year. Mother has not been feeling well and looks to [place] for help and then rejects assistance.   Past Psychiatric Medication Trials: Klonopin-  Reports that she was once on 6 mg total daily dose and has decreased to 3 mg. Reports Klonopin has been helpful for anxiety.  Trazodone- Effective. Has some excessive daytime somnolence. Latuda Effexor XR- Has been helpful for mood and anxiety.  Prozac Paxil Zoloft Wellbutrin Carbamazepine XR- Has been helpful for episodic agitation/irritability/aggression Depakote- Helpful  Nortriptyline  PHQ2-9     Nutrition from [facility]/05/[facility] Nutrition from 08/03/2014 in [facility] Nutrition from 06/16/2014 in [facility]  PHQ-2 Total Score 0 6 3       Review of Systems:  Review of Systems  Eyes: Positive for visual disturbance.  Gastrointestinal: Negative.   Musculoskeletal: Negative for gait problem.       Plantar fasciitis  Neurological: Negative for tremors.  Psychiatric/Behavioral:       Please refer to HPI    Medications: I have reviewed the patient's current medications.  Current Outpatient Medications  Medication Sig Dispense Refill  . cholecalciferol (VITAMIN D3) 25 MCG (1000 UNIT) tablet Take 2,000 Units by mouth daily.    . meloxicam (MOBIC) 15 MG tablet Take 1 tablet (15 mg total) by mouth daily. 30 tablet 3  . carbamazepine (CARBATROL) 300 MG 12 hr capsule Take 600 mg by mouth at bedtime.    . clonazePAM (KLONOPIN) 1 MG tablet Take 3 tablets (3 mg total) by mouth at bedtime. 90 tablet 1  . L-Methylfolate-Algae (DEPLIN 15) 15-90.314 MG CAPS  Take 15 mg by mouth daily. 30 capsule 0  . loratadine (CLARITIN) 10 MG tablet Take 10 mg by mouth daily as needed.     . Multiple Vitamin (MULTIVITAMIN) tablet Take 1 tablet by mouth daily. (Patient not taking: Reported on 10/20/2019)    . ofloxacin (OCUFLOX) 0.3 % ophthalmic solution     . prednisoLONE acetate (PRED FORTE) 1 % ophthalmic suspension     . SUMAtriptan (IMITREX) 100 MG tablet Take 1 tablet (100 mg total) by mouth once as needed  for migraine or headache. May repeat in 2 hours if headache persists or recurs. 10 tablet 0  . traZODone (DESYREL) 100 MG tablet Take 1 tablet (100 mg total) by mouth at bedtime. (Patient taking differently: Take 150 mg by mouth at bedtime. ) 90 tablet 0  . venlafaxine XR (EFFEXOR XR) 150 MG 24 hr capsule Take 2 capsules (300 mg total) by mouth daily with breakfast. 180 capsule 0   No current facility-administered medications for this visit.    Medication Side Effects: None  Allergies:  Allergies  Allergen Reactions  . Onion   . Tape Other (See Comments)    RASH, PT ALSO STATES RASH WITH COBAN    Past Medical History:  Diagnosis Date  . Anxiety   . Depression   . FHx: malignant neoplasm of breast in first degree relative 07/23/2011  . Hyperlipidemia   . PTSD (post-traumatic stress disorder)   . Thyroid disease     Family History  Problem Relation Age of Onset  . Breast cancer Mother 1       BRCA/BART negative  . Lung cancer Mother 32       smoker  . Alcohol abuse Mother   . Breast cancer Sister 14  . Melanoma Sister 26  . Dementia Father   . Breast cancer Maternal Aunt 70  . Breast cancer Sister 39       BRCA-/no BART  . Lung cancer Maternal Uncle        smoker  . Lung cancer Maternal Uncle        smoker  . Heart attack Maternal Aunt   . Schizophrenia Maternal Aunt   . Schizophrenia Maternal Aunt   . Schizophrenia Maternal Uncle     Social History   Socioeconomic History  . Marital status: Single    Spouse name: Not on file  . Number of children: 0  . Years of education: Not on file  . Highest education level: Bachelor's degree (e.g., BA, AB, BS)  Occupational History  . Not on file  Tobacco Use  . Smoking status: Never Smoker  . Smokeless tobacco: Never Used  Vaping Use  . Vaping Use: Never used  Substance and Sexual Activity  . Alcohol use: No  . Drug use: No  . Sexual activity: Not Currently  Other Topics Concern  . Not on file  Social History  Narrative  . Not on file   Social Determinants of Health   Financial Resource Strain:   . Difficulty of Paying Living Expenses: Not on file  Food Insecurity:   . Worried About Charity fundraiser in the Last Year: Not on file  . Ran Out of Food in the Last Year: Not on file  Transportation Needs:   . Lack of Transportation (Medical): Not on file  . Lack of Transportation (Non-Medical): Not on file  Physical Activity:   . Days of Exercise per Week: Not on file  . Minutes of Exercise per  Session: Not on file  Stress:   . Feeling of Stress : Not on file  Social Connections:   . Frequency of Communication with Friends and Family: Not on file  . Frequency of Social Gatherings with Friends and Family: Not on file  . Attends Religious Services: Not on file  . Active Member of Clubs or Organizations: Not on file  . Attends Archivist Meetings: Not on file  . Marital Status: Not on file  Intimate Partner Violence:   . Fear of Current or Ex-Partner: Not on file  . Emotionally Abused: Not on file  . Physically Abused: Not on file  . Sexually Abused: Not on file    Past Medical History, Surgical history, Social history, and Family history were reviewed and updated as appropriate.   Please see review of systems for further details on the patient's review from today.   Objective:   Physical Exam:  There were no vitals taken for this visit.  Physical Exam Constitutional:      General: She is not in acute distress. Musculoskeletal:        General: No deformity.  Neurological:     Mental Status: She is alert and oriented to person, place, and time.     Coordination: Coordination normal.  Psychiatric:        Attention and Perception: Attention and perception normal. She does not perceive auditory or visual hallucinations.        Mood and Affect: Mood is anxious and depressed. Affect is not labile, blunt, angry or inappropriate.        Speech: Speech normal.         Behavior: Behavior normal.        Thought Content: Thought content normal. Thought content is not paranoid or delusional. Thought content does not include homicidal or suicidal ideation. Thought content does not include homicidal or suicidal plan.        Cognition and Memory: Cognition and memory normal.        Judgment: Judgment normal.     Comments: Insight intact     Lab Review:     Component Value Date/Time   NA 138 11/21/2019 1205   NA 140 07/20/2008 1030   K 4.3 11/21/2019 1205   K 3.9 07/20/2008 1030   CL 101 11/21/2019 1205   CL 101 07/20/2008 1030   CO2 30 11/21/2019 1205   CO2 30 07/20/2008 1030   GLUCOSE 98 11/21/2019 1205   GLUCOSE 106 07/20/2008 1030   BUN 11 11/21/2019 1205   BUN 13 07/20/2008 1030   CREATININE 0.65 11/21/2019 1205   CALCIUM 9.9 11/21/2019 1205   CALCIUM 9.2 07/20/2008 1030   PROT 7.0 11/21/2019 1205   PROT 7.5 07/20/2008 1030   ALBUMIN 4.3 07/23/2011 1427   ALBUMIN 3.7 07/20/2008 1030   AST 16 11/21/2019 1205   AST 22 07/20/2008 1030   ALT 14 11/21/2019 1205   ALT 22 07/20/2008 1030   ALKPHOS 55 07/23/2011 1427   ALKPHOS 67 07/20/2008 1030   BILITOT 0.4 11/21/2019 1205   BILITOT 0.50 07/20/2008 1030       Component Value Date/Time   WBC 5.5 11/21/2019 1205   RBC 4.58 11/21/2019 1205   HGB 14.6 11/21/2019 1205   HGB 13.3 07/23/2011 1427   HCT 42.8 11/21/2019 1205   HCT 40.8 07/23/2011 1427   PLT 210 11/21/2019 1205   PLT 196 07/23/2011 1427   MCV 93.4 11/21/2019 1205   MCV 95.0 07/23/2011 1427  MCH 31.9 11/21/2019 1205   MCHC 34.1 11/21/2019 1205   RDW 11.8 11/21/2019 1205   RDW 12.4 07/23/2011 1427   LYMPHSABS 2.3 07/23/2011 1427   MONOABS 0.5 07/23/2011 1427   EOSABS 0.2 07/23/2011 1427   EOSABS 0.2 07/20/2008 1030   BASOSABS 0.1 07/23/2011 1427    No results found for: POCLITH, LITHIUM   Lab Results  Component Value Date   CBMZ 8.4 11/21/2019     .res Assessment: Plan:   Pt seen for 30 minutes and time spent  counseling her re: possible treatment options. Discussed potential benefits of pharmacogenetic testing. Also discussed MTHFR mutation and that it may be beneficial to start trial of Deplin. Discussed potential benefits, risks, and side effects of Deplin. Pt agrees to trial of Deplin.  Will start Deplin 15 mg po qd for augmentation of depression.  Continue Klonopin 3 mg po QHS for insomnia.  Conitnue Trazodone 150 mg po QHS for insomia.  Continue Effexor XR 300 mg po qd for depression and anxiety.  Continue Carbatrol 600 mg po QHS for mood stabilization.  Recommend continuing therapy with [name], [facility]. Pt to follow-up with this provider in 6 weeks or sooner if clinically indicated.  Patient advised to contact office with any questions, adverse effects, or acute worsening in signs and symptoms.  [place] was seen today for depression, fatigue and anxiety.  Diagnoses and all orders for this visit:  Depression, unspecified depression type -     L-Methylfolate-Algae (DEPLIN 15) 15-90.314 MG CAPS; Take 15 mg by mouth daily.     Please see After Visit Summary for patient specific instructions.  Future Appointments  Date Time Provider [place]  12/22/2019 11:15 AM Criselda Peaches, DPM [facility]  01/05/2020  1:00 PM [name], [facility] None  01/17/2020  2:00 PM [name], [facility] None  01/24/2020 11:00 AM [name], [facility] None  02/07/2020 10:00 AM Thayer Headings, PMHNP [facility] None  02/08/2020 10:00 AM [name], [facility] None    No orders of the defined types were placed in this encounter.   -------------------------------

## 2019-12-22 ENCOUNTER — Ambulatory Visit: Payer: 59 | Admitting: Podiatry

## 2019-12-22 ENCOUNTER — Other Ambulatory Visit: Payer: Self-pay

## 2019-12-22 ENCOUNTER — Encounter: Payer: Self-pay | Admitting: Podiatry

## 2019-12-22 DIAGNOSIS — M722 Plantar fascial fibromatosis: Secondary | ICD-10-CM

## 2019-12-22 DIAGNOSIS — M7731 Calcaneal spur, right foot: Secondary | ICD-10-CM | POA: Diagnosis not present

## 2019-12-22 MED ORDER — METHYLPREDNISOLONE 4 MG PO TBPK: ORAL_TABLET | ORAL | 0 refills | Status: DC

## 2019-12-22 NOTE — Patient Instructions (Addendum)
Wear the boot when walking and at work   Look for "[employer]" and wear while in the boot and shoes   Take the methylprednisolone taper for 6 days. Stop taking the meloxicam while you are on that and then resume after finishing the methylprednisolone taper

## 2019-12-25 ENCOUNTER — Encounter: Payer: Self-pay | Admitting: Podiatry

## 2019-12-25 NOTE — Progress Notes (Signed)
  Subjective:  Patient ID: Diana Ayers, female    DOB: 1961-01-05,  MRN: [id number]  Chief Complaint  Patient presents with  . Plantar Fasciitis    Right heel pain. PT stated that she is not doing any better she would say that she is about 10-20% better. She is still having pain     59 y.o. female returns with the above complaint. History confirmed with patient.  The brace did not seem to help.  The pain is not horrible but still very bothersome for her.  Objective:  Physical Exam: warm, good capillary refill, no trophic changes or ulcerative lesions, normal DP and PT pulses and normal sensory exam.   Right Foot: Sharp pain on palpation to the plantar heel, none in the mid plantar fascia, no evidence of plantar fibroma   Radiographs: X-ray of the right foot: Plantar calcaneal spur is noted Assessment:   1. Plantar fasciitis of right foot   2. Calcaneal spur of right foot      Plan:  Patient was evaluated and treated and all questions answered.  Discussed the etiology and treatment options for plantar fasciitis including stretching, formal physical therapy, supportive shoegears such as a running shoe or sneaker, pre fabricated orthoses, injection therapy, and oral medications. We also discussed the role of surgical treatment of this for patients who do not improve after exhausting non-surgical treatment options.    -Today recommended we increase her to a CAM boot. -Recommended Tuli's heel cups for her both in the boot and if she is comfortable out of them in a shoe. -Continue stretching and icing -Methylprednisolone taper was sent to her pharmacy.  Recommend this for 1 week and then resume meloxicam.   Return in about 5 weeks (around 01/26/2020).

## 2019-12-26 ENCOUNTER — Telehealth: Payer: Self-pay | Admitting: Psychiatry

## 2019-12-26 NOTE — Telephone Encounter (Signed)
Patient's next appointment is 02/07/20. Requesting refill for Clonazepam and Trazodone. Call to [facility] on [address], [place], [place].

## 2019-12-27 ENCOUNTER — Other Ambulatory Visit: Payer: Self-pay

## 2019-12-27 DIAGNOSIS — F4312 Post-traumatic stress disorder, chronic: Secondary | ICD-10-CM

## 2019-12-27 MED ORDER — CLONAZEPAM 1 MG PO TABS: 3.0000 mg | ORAL_TABLET | Freq: Every day | ORAL | 1 refills | Status: DC

## 2019-12-27 MED ORDER — TRAZODONE HCL 100 MG PO TABS: 150.0000 mg | ORAL_TABLET | Freq: Every day | ORAL | 1 refills | Status: DC

## 2019-12-27 NOTE — Telephone Encounter (Signed)
Diana Ayers out of medicine all out of the klonopin., Please send

## 2019-12-27 NOTE — Telephone Encounter (Signed)
Pended for Janett Billow to send

## 2019-12-28 ENCOUNTER — Telehealth: Payer: Self-pay | Admitting: Psychiatry

## 2019-12-28 DIAGNOSIS — F4312 Post-traumatic stress disorder, chronic: Secondary | ICD-10-CM

## 2019-12-28 MED ORDER — CLONAZEPAM 1 MG PO TABS: ORAL_TABLET | ORAL | 1 refills | Status: DC

## 2019-12-28 MED ORDER — TRAZODONE HCL 300 MG PO TABS: 300.0000 mg | ORAL_TABLET | Freq: Every day | ORAL | 1 refills | Status: DC

## 2019-12-28 NOTE — Telephone Encounter (Signed)
Diana Ayers called today because she picked up her medication yesterday. But she said both the Clonazepam and the trazodone had been decreased in dosage. She said the Clonazepam was decreased to just 3mg  qHS. She said she was taking the 3mg  qHS, but was also taking 3mg  during the day prn.  With her mother in the hospital again the dose during the day is needed but she now doesn't have it to take.  The Trazodone was decreased from 300mg  to 150mg .  So it was cut in half.  She would like Janett Billow to call her to explain medically why she decreased her medications by half.

## 2020-01-03 NOTE — Telephone Encounter (Signed)
Received clarification request from pharmacy re: Klonopin. Contacted pt's pharmacy.

## 2020-01-05 ENCOUNTER — Ambulatory Visit (INDEPENDENT_AMBULATORY_CARE_PROVIDER_SITE_OTHER): Payer: 59 | Admitting: Psychiatry

## 2020-01-05 DIAGNOSIS — F331 Major depressive disorder, recurrent, moderate: Secondary | ICD-10-CM | POA: Diagnosis not present

## 2020-01-05 NOTE — Progress Notes (Signed)
    [facility] Counselor/Therapist Progress Note  Patient ID: Diana Ayers, MRN: [id number],    Date: 01/05/2020  Time Spent: 51 minutes start time 1:05 PM end time 1:56 PM Virtual Visit via Telephone Note Connected with patient by a video enabled telemedicine/telehealth application or telephone, with their informed consent, and verified patient privacy and that I am speaking with the correct person using two identifiers. I discussed the limitations, risks, security and privacy concerns of performing psychotherapy and management service by telephone and the availability of in person appointments. I also discussed with the patient that there may be a patient responsible charge related to this service. The patient expressed understanding and agreed to proceed. I discussed the treatment planning with the patient. The patient was provided an opportunity to ask questions and all were answered. The patient agreed with the plan and demonstrated an understanding of the instructions. The patient was advised to call  our office if  symptoms worsen or feel they are in a crisis state and need immediate contact.   Therapist Location: office Patient Location: home    Treatment Type: Individual Therapy  Reported Symptoms: depression, anxiety, sleep issues, fatigue, triggered responses, frustration   Risk Assessment: Danger to Self:  No Self-injurious Behavior: No Danger to Others: No Duty to Warn:no Physical Aggression / Violence:No  Access to Firearms a concern: No  Gang Involvement:No   Subjective: Met with patient via  Phone due to not being able to connect due to technical difficulties.     She shared that she has been depressed.  She recognizes that she is working too many hours and her mother was in the hospital for a week again which meant she had to go down there with her. She went on to share her neighbor of over 20 years that she was very close to died Monday after a stroke and  brain surgery.  She shared that has been really hard especially since she couldn't say good bye to him due to COVID. Patient acknowledged that death triggers her PTSD and depression symptoms. She isn't going to get off until Christmas Eve. She is also not getting to see her roommate because they are working different hours. Patient went on to share that due to lots of issues she ran out of one of her medications and that made things worse for her.  She shared that she is frustrated with her family and everything else currently. She went on to share she got into an argument with her mother's  doctor because her mother's levels were deadly low and she wanted to come up with a plan to monitor her levels better. Her mother decided she does not want to go to rehab so that was frustrating. Discussed the importance of remembering that her mother is going to do what she wants to do and she can't change it even when it is frustrating. She has had some positive things happen at work which is good even though working so many hours is hard. Encouraged her to focus on the positives and work on setting limits with family and work so she can get back to taking care of herself.  Interventions: Cognitive Behavioral Therapy and Solution-Oriented/Positive Psychology  Diagnosis:   ICD-10-CM   1. Major depressive disorder, recurrent episode, moderate (HCC)  F33.1     Plan: Patient is to use CBT and coping skills to decrease depression symptoms.  Patient is to work on limit setting and self care.  She is   to try and walk regularly to release emotions appropriately. Long term goal: Elevate mood and show evidence of usual energy, activities, and socialization level. Short term goal: Identify and replace depressive thinking that leads to depressive feelings and actions.  [name], [facility]

## 2020-01-17 ENCOUNTER — Ambulatory Visit: Payer: 59 | Admitting: Psychiatry

## 2020-01-19 ENCOUNTER — Ambulatory Visit (INDEPENDENT_AMBULATORY_CARE_PROVIDER_SITE_OTHER): Payer: 59 | Admitting: Psychiatry

## 2020-01-19 ENCOUNTER — Other Ambulatory Visit: Payer: Self-pay

## 2020-01-19 DIAGNOSIS — F4310 Post-traumatic stress disorder, unspecified: Secondary | ICD-10-CM
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
## 2020-01-24 ENCOUNTER — Ambulatory Visit: Payer: 59 | Admitting: Psychiatry

## 2020-01-26 ENCOUNTER — Ambulatory Visit: Payer: 59 | Admitting: Podiatry

## 2020-02-07 ENCOUNTER — Ambulatory Visit: Payer: 59 | Admitting: Psychiatry

## 2020-02-08 ENCOUNTER — Ambulatory Visit: Payer: 59 | Admitting: Psychiatry

## 2020-02-09 ENCOUNTER — Ambulatory Visit (INDEPENDENT_AMBULATORY_CARE_PROVIDER_SITE_OTHER): Payer: 59 | Admitting: Psychiatry

## 2020-02-09 DIAGNOSIS — F4310 Post-traumatic stress disorder, unspecified: Secondary | ICD-10-CM

## 2020-02-09 NOTE — Progress Notes (Signed)
[facility] Counselor/Therapist Progress Note  Patient ID: Diana Ayers, MRN: [id number],    Date: 02/09/2020  Time Spent: 52 minutes start time 11:03 AM end time 11:55 AM Virtual Visit via Telephone Note Connected with patient by a video enabled telemedicine/telehealth application, with their informed consent, and verified patient privacy and that I am speaking with the correct person using two identifiers. I discussed the limitations, risks, security and privacy concerns of performing psychotherapy and management service by telephone and the availability of in person appointments. I also discussed with the patient that there may be a patient responsible charge related to this service. The patient expressed understanding and agreed to proceed. I discussed the treatment planning with the patient. The patient was provided an opportunity to ask questions and all were answered. The patient agreed with the plan and demonstrated an understanding of the instructions. The patient was advised to call  our office if  symptoms worsen or feel they are in a crisis state and need immediate contact.   Therapist Location: home Patient Location: home    Treatment Type: Individual Therapy  Reported Symptoms: depression, triggered responses, frustration,anxiety, grief issues  Mental Status Exam:  Appearance:   Casual     Behavior:  Appropriate  Motor:  Normal  Speech/Language:   Normal Rate  Affect:  Appropriate  Mood:  normal  Thought process:  normal  Thought content:    WNL  Sensory/Perceptual disturbances:    WNL  Orientation:  oriented to person, place, time/date and situation  Attention:  Good  Concentration:  Good  Memory:  WNL  Fund of knowledge:   Good  Insight:    Good  Judgment:   Good  Impulse Control:  Good   Risk Assessment: Danger to Self:  No Self-injurious Behavior: No Danger to Others: No Duty to Warn:no Physical Aggression / Violence:No  Access to Firearms  a concern: No  Gang Involvement:No   Subjective: Met with patient via virtual session.  She shared that she is doing better overall.  She reported that things are better with her family.  Work is still up and down but she found out that she is going to go on a permeant basis. Her foot is healing which is also positive.  Patient went on to share this is a hard month due to the trauma of her father's death.  She shared that he had a seizure on the 11th and than things progressed to his death very quickly. She shared she wants to work through those memories further. He passed on the 04/07/2022 and his service was on the 26th. She shared that she and her sister have been trying to share something each day they appreciated about their father each day since the 11th. She went on to explain she was able to let her sisters know that she is at the limit with the negative and traumatic things so she doesn't want to hear it.  She is also still grieving the loss of her neighbor.  Patient decided to do an EMDR set on dad's death -the picture was holding him as he had a seizure, SUDS level 9, negative cognition "I am powerless" felt inadequate, sadness, shock in her chest.  Patient was able to reduce suds level to 7.  She did not want to go any further in today's session.  Patient was able to realize that during that experience of her dad's seizure she went into her emergency work mode and was  not as comforting to him in the moment as she would have liked to have been.  Patient was encouraged to realize that her mother was not able to go into that mode and manage it so she needed patient to respond the way she did and that it was appropriate.  Encouraged patient to let the processing take place over the next few weeks.  She was also encouraged to write her dad a letter sharing any thoughts that she had over that time.  That she would like to share with him.   Interventions: Solution-Oriented/Positive Psychology and [employer]  Desensitization and Reprocessing (EMDR)  Diagnosis:   ICD-10-CM   1. PTSD (post-traumatic stress disorder)  F43.10     Plan: Patient is to use CBT and coping skills to decrease triggered responses.  Patient is to write a letter to her father sharing her experiences with his traumatic seizure and the days following to his death.  Patient is to continue finding ways to release negative emotions appropriately through exercise. Long-term goal: Develop and implement effective coping skills to carry out normal responsibilities and participate constructively in relationships Short-term goal: Practice implement relaxation training as a coping mechanism for tension panic stress anger and anxiety  [name], [facility]

## 2020-02-22 ENCOUNTER — Encounter: Payer: Self-pay | Admitting: Psychiatry

## 2020-02-22 ENCOUNTER — Ambulatory Visit (INDEPENDENT_AMBULATORY_CARE_PROVIDER_SITE_OTHER): Payer: 59 | Admitting: Psychiatry

## 2020-02-22 ENCOUNTER — Other Ambulatory Visit: Payer: Self-pay

## 2020-02-22 DIAGNOSIS — F4312 Post-traumatic stress disorder, chronic: Secondary | ICD-10-CM | POA: Diagnosis not present

## 2020-02-22 DIAGNOSIS — Z79899 Other long term (current) drug therapy: Secondary | ICD-10-CM | POA: Diagnosis not present

## 2020-02-22 DIAGNOSIS — F32A Depression, unspecified: Secondary | ICD-10-CM

## 2020-02-22 MED ORDER — VENLAFAXINE HCL ER 150 MG PO CP24: 300.0000 mg | ORAL_CAPSULE | Freq: Every day | ORAL | 1 refills | Status: DC

## 2020-02-22 MED ORDER — CARBAMAZEPINE ER 300 MG PO CP12: 600.0000 mg | ORAL_CAPSULE | Freq: Every day | ORAL | 1 refills | Status: DC

## 2020-02-22 MED ORDER — L-METHYLFOLATE 15 MG PO TABS: 15.0000 mg | ORAL_TABLET | Freq: Every day | ORAL | 5 refills | Status: DC

## 2020-02-22 MED ORDER — TRAZODONE HCL 100 MG PO TABS: ORAL_TABLET | ORAL | 1 refills | Status: DC

## 2020-02-22 MED ORDER — SUMATRIPTAN SUCCINATE 100 MG PO TABS: 100.0000 mg | ORAL_TABLET | Freq: Once | ORAL | 1 refills | Status: AC | PRN

## 2020-02-22 NOTE — Progress Notes (Signed)
Diana Ayers [id number] November 17, 1960 60 y.o.  Subjective:   Patient ID:  Diana Ayers is a 60 y.o. (DOB 20-Oct-1960) female.  Chief Complaint:  Chief Complaint  Patient presents with  . Anxiety  . Depression    HPI Diana Ayers presents to the office today for follow-up of anxiety and depression. She reports "the hard parts of the seasons has passed" with holidays and anniversary of father's death in 2022/03/17. She reports that she is feeling "about 25% better." She reports improved energy and fatigue and reports that she continues to have low energy. She reports that she notices some increase in interest in things. Has not been fishing recently, partly because of cold temperatures. "There's still some depression." She reports that she is recognizing her father's death was  Traumatic due to him having a seizure while she was present and with him having prolonged suffering. Has had to take Klonopin prn when she works. Denies panic attacks. She reports that she will get anxious about the work load. She reports that Klonopin is helpful for anxiety. Taking Klonopin 2.5 mg po QHS and will take 1 tab po qd prn anxiety. She reports difficulty with concentration and memory, such as the 3 departments her supervisor said she may need to work in. Sleeping ok. Appetite has been increased and reports some wt gain. 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 Denies SI.   Was working about 38 hours a week for [employer] around the holidays and hours have now decreased.   Mother continues to have health issues and was hospitalized again in December. Mother refused to go to SNF after hospital discharge and left AMA. Diana Ayers reports that her mother wanted them to take care of her.   She reports that she set limits with family not to contact her with bad news unless it affects someone in their immediate family.   She reports that she is walking a lot at work.   Past Psychiatric Medication  Trials: Klonopin- Reports that she was once on 6 mg total daily dose and has decreased to 3 mg. Reports Klonopin has been helpful for anxiety.  Trazodone- Effective. Has some excessive daytime somnolence. Latuda Effexor XR- Has been helpful for mood and anxiety.  Prozac Paxil Zoloft Wellbutrin Carbamazepine XR- Has been helpful for episodic agitation/irritability/aggression Depakote- Helpful  Nortriptyline   PHQ2-9   Flowsheet Row [facility] 08/03/2014 [facility] Nutrition from 06/16/2014 in [facility]  PHQ-2 Total Score 0 6 3       Review of Systems:  Review of Systems  Eyes: Positive for visual disturbance.       Worsening vision in left eye and reports that she may need cataract surgery.  Musculoskeletal: Negative for gait problem.       She reports improved plantar fasciitis  Psychiatric/Behavioral:       Please refer to HPI    Medications: I have reviewed the patient's current medications.  Current Outpatient Medications  Medication Sig Dispense Refill  . cholecalciferol (VITAMIN D3) 25 MCG (1000 UNIT) tablet Take 2,000 Units by mouth daily.    . clonazePAM (KLONOPIN) 1 MG tablet Take 3 tabs po QHS and 1-3 tabs as needed for anxiety 120 tablet 1  . L-Methylfolate 15 MG TABS Take 1 tablet (15 mg total) by mouth daily. 30 tablet 5  . L-Methylfolate-Algae (DEPLIN 15) 15-90.314 MG CAPS Take 15 mg  by mouth daily. 30 capsule 0  . meloxicam (MOBIC) 15 MG tablet Take 1 tablet (15 mg total) by mouth daily. 30 tablet 3  . Multiple Vitamins-Minerals (MULTIVITAMIN GUMMIES ADULT PO) Take by mouth.    . vitamin C (ASCORBIC ACID) 250 MG tablet Take 250 mg by mouth daily.    . carbamazepine (CARBATROL) 300 MG 12 hr capsule Take 2 capsules (600 mg total) by mouth at bedtime. 180 capsule 1  . diclofenac Sodium (VOLTAREN) 1 % GEL Voltaren 1 % topical  gel    . loratadine (CLARITIN) 10 MG tablet Take 10 mg by mouth daily as needed.  (Patient not taking: Reported on 02/22/2020)    . SUMAtriptan (IMITREX) 100 MG tablet Take 1 tablet (100 mg total) by mouth once as needed for migraine or headache. May repeat in 2 hours if headache persists or recurs. 10 tablet 1  . trazodone (DESYREL) 100 MG tablet Take 1.5-3 tabs po QHS 270 tablet 1  . venlafaxine XR (EFFEXOR XR) 150 MG 24 hr capsule Take 2 capsules (300 mg total) by mouth daily with breakfast. 180 capsule 1   No current facility-administered medications for this visit.    Medication Side Effects: None  Allergies:  Allergies  Allergen Reactions  . Onion Other (See Comments)  . Tape Other (See Comments)    RASH, PT ALSO STATES RASH WITH COBAN    Past Medical History:  Diagnosis Date  . Anxiety   . Depression   . FHx: malignant neoplasm of breast in first degree relative 07/23/2011  . Hyperlipidemia   . PTSD (post-traumatic stress disorder)   . Thyroid disease     Family History  Problem Relation Age of Onset  . Breast cancer Mother 24       BRCA/BART negative  . Lung cancer Mother 103       smoker  . Alcohol abuse Mother   . Breast cancer Sister 70  . Melanoma Sister 25  . Dementia Father   . Breast cancer Maternal Aunt 70  . Breast cancer Sister 67       BRCA-/no BART  . Lung cancer Maternal Uncle        smoker  . Lung cancer Maternal Uncle        smoker  . Heart attack Maternal Aunt   . Schizophrenia Maternal Aunt   . Schizophrenia Maternal Aunt   . Schizophrenia Maternal Uncle     Social History   Socioeconomic History  . Marital status: Single    Spouse name: Not on file  . Number of children: 0  . Years of education: Not on file  . Highest education level: Bachelor's degree (e.g., BA, AB, BS)  Occupational History  . Not on file  Tobacco Use  . Smoking status: Never Smoker  . Smokeless tobacco: Never Used  Vaping Use  . Vaping Use: Never used   Substance and Sexual Activity  . Alcohol use: No  . Drug use: No  . Sexual activity: Not Currently  Other Topics Concern  . Not on file  Social History Narrative  . Not on file   Social Determinants of Health   Financial Resource Strain: Not on file  Food Insecurity: Not on file  Transportation Needs: Not on file  Physical Activity: Not on file  Stress: Not on file  Social Connections: Not on file  Intimate Partner Violence: Not on file    Past Medical History, Surgical history, Social history, and Family history were reviewed  and updated as appropriate.   Please see review of systems for further details on the patient's review from today.   Objective:   Physical Exam:  There were no vitals taken for this visit.  Physical Exam Constitutional:      General: She is not in acute distress. Musculoskeletal:        General: No deformity.  Neurological:     Mental Status: She is alert and oriented to person, place, and time.     Coordination: Coordination normal.  Psychiatric:        Attention and Perception: Attention and perception normal. She does not perceive auditory or visual hallucinations.        Mood and Affect: Mood is anxious. Mood is not depressed. Affect is not labile, blunt, angry or inappropriate.        Speech: Speech normal.        Behavior: Behavior normal.        Thought Content: Thought content normal. Thought content is not paranoid or delusional. Thought content does not include homicidal or suicidal ideation. Thought content does not include homicidal or suicidal plan.        Cognition and Memory: Cognition and memory normal.        Judgment: Judgment normal.     Comments: Insight intact     Lab Review:     Component Value Date/Time   NA 138 11/21/2019 1205   NA 140 07/20/2008 1030   K 4.3 11/21/2019 1205   K 3.9 07/20/2008 1030   CL 101 11/21/2019 1205   CL 101 07/20/2008 1030   CO2 30 11/21/2019 1205   CO2 30 07/20/2008 1030   GLUCOSE 98  11/21/2019 1205   GLUCOSE 106 07/20/2008 1030   BUN 11 11/21/2019 1205   BUN 13 07/20/2008 1030   CREATININE 0.65 11/21/2019 1205   CALCIUM 9.9 11/21/2019 1205   CALCIUM 9.2 07/20/2008 1030   PROT 7.0 11/21/2019 1205   PROT 7.5 07/20/2008 1030   ALBUMIN 4.3 07/23/2011 1427   ALBUMIN 3.7 07/20/2008 1030   AST 16 11/21/2019 1205   AST 22 07/20/2008 1030   ALT 14 11/21/2019 1205   ALT 22 07/20/2008 1030   ALKPHOS 55 07/23/2011 1427   ALKPHOS 67 07/20/2008 1030   BILITOT 0.4 11/21/2019 1205   BILITOT 0.50 07/20/2008 1030       Component Value Date/Time   WBC 5.5 11/21/2019 1205   RBC 4.58 11/21/2019 1205   HGB 14.6 11/21/2019 1205   HGB 13.3 07/23/2011 1427   HCT 42.8 11/21/2019 1205   HCT 40.8 07/23/2011 1427   PLT 210 11/21/2019 1205   PLT 196 07/23/2011 1427   MCV 93.4 11/21/2019 1205   MCV 95.0 07/23/2011 1427   MCH 31.9 11/21/2019 1205   MCHC 34.1 11/21/2019 1205   RDW 11.8 11/21/2019 1205   RDW 12.4 07/23/2011 1427   LYMPHSABS 2.3 07/23/2011 1427   MONOABS 0.5 07/23/2011 1427   EOSABS 0.2 07/23/2011 1427   EOSABS 0.2 07/20/2008 1030   BASOSABS 0.1 07/23/2011 1427    No results found for: POCLITH, LITHIUM   Lab Results  Component Value Date   CBMZ 8.4 11/21/2019     .res Assessment: Plan:   Will continue current medications since pt reports some recent improvement in mood and anxiety. Discussed obtaining generic form of L-methylfolate and will send script to her pharmacy.  Will also order repeat Vitamin D level to assess if Vitamin D insufficiency has resolved. Pt also  requests Hgb A1C. Will change trazodone to 100 mg tabs for her to take 1.5-3 tabs po QHS for insomnia.  Continue Effexor XR 300 mg po qd for depression and anxiety.  Continue Klonopin 3 mg po QHS and prn anxiety. Continue Carbamazepine 600 mg po QHS for mood stabilization.  Pt to follow-up in 3 months or sooner if clinically indicated.  Patient advised to contact office with any questions,  adverse effects, or acute worsening in signs and symptoms.  Diana Ayers was seen today for anxiety and depression.  Diagnoses and all orders for this visit:  High risk medication use -     Hemoglobin A1c -     VITAMIN D 25 Hydroxy (Vit-D Deficiency, Fractures)  Chronic post-traumatic stress disorder (PTSD) -     SUMAtriptan (IMITREX) 100 MG tablet; Take 1 tablet (100 mg total) by mouth once as needed for migraine or headache. May repeat in 2 hours if headache persists or recurs. -     trazodone (DESYREL) 100 MG tablet; Take 1.5-3 tabs po QHS -     venlafaxine XR (EFFEXOR XR) 150 MG 24 hr capsule; Take 2 capsules (300 mg total) by mouth daily with breakfast.  Depression, unspecified depression type -     venlafaxine XR (EFFEXOR XR) 150 MG 24 hr capsule; Take 2 capsules (300 mg total) by mouth daily with breakfast. -     carbamazepine (CARBATROL) 300 MG 12 hr capsule; Take 2 capsules (600 mg total) by mouth at bedtime. -     L-Methylfolate 15 MG TABS; Take 1 tablet (15 mg total) by mouth daily.     Please see After Visit Summary for patient specific instructions.  Future Appointments  Date Time Provider [place]  02/27/2020  1:00 PM [name], [facility] CP-CP None  03/12/2020  2:00 PM [name], [facility] CP-CP None  05/21/2020 10:30 AM Thayer Headings, PMHNP CP-CP None    Orders Placed This Encounter  Procedures  . Hemoglobin A1c  . VITAMIN D 25 Hydroxy (Vit-D Deficiency, Fractures)    -------------------------------

## 2020-02-27 ENCOUNTER — Ambulatory Visit (INDEPENDENT_AMBULATORY_CARE_PROVIDER_SITE_OTHER): Payer: 59 | Admitting: Psychiatry

## 2020-02-27 ENCOUNTER — Other Ambulatory Visit: Payer: Self-pay

## 2020-02-27 DIAGNOSIS — F4310 Post-traumatic stress disorder, unspecified: Secondary | ICD-10-CM

## 2020-02-27 NOTE — Progress Notes (Signed)
      [facility] Counselor/Therapist Progress Note  Patient ID: Diana Ayers, MRN: [id number],    Date: 02/27/2020  Time Spent: 52 minutes start time 1:02 PM end time 1:54 PM  Treatment Type: Individual Therapy  Reported Symptoms: sadness, triggered responses, agitation, anxiety  Mental Status Exam:  Appearance:   Casual and Neat     Behavior:  Appropriate  Motor:  Normal  Speech/Language:   Normal Rate  Affect:  Appropriate  Mood:  normal  Thought process:  normal  Thought content:    WNL  Sensory/Perceptual disturbances:    WNL  Orientation:  oriented to person, place, time/date and situation  Attention:  Good  Concentration:  Good  Memory:  WNL  Fund of knowledge:   Good  Insight:    Good  Judgment:   Good  Impulse Control:  Good   Risk Assessment: Danger to Self:  No Self-injurious Behavior: No Danger to Others: No Duty to Warn:no Physical Aggression / Violence:No  Access to Firearms a concern: No  Gang Involvement:No   Subjective: Patient was present for session. She explained that she had a bad weekend with her family.  She had a hard time with her mother who triggers her.   Patient was encouraged to think through the weekend and figure out what was the most disturbing part for her.  She shared when she lost her temper and snapped at her sister Diana Ayers did EMDR set on that issue suds level 10, negative cognition "I blew it" felt shame and guilt in her chest.  Patient was able to reduce as level to 5.  She was encouraged to recognize that all of their moods were very anxious and overwhelmed which made the irritability quick to surface.  Patient was encouraged to remind herself that she is enough and to give herself Diana Ayers just like she would anybody else in the situation.  Patient was able to realize that it was just a bad moment and she can talk to her sister about why she was so edgy.  She acknowledged that spending that much time with her mother and not having any  releases was not a healthy decision.  Encouraged her to take time every few hours to herself when she chooses to spend the weekends with her mother.  Interventions: Solution-Oriented/Positive Psychology and Eye Movement Desensitization and Reprocessing (EMDR)  Diagnosis:   ICD-10-CM   1. PTSD (post-traumatic stress disorder)  F43.10     Plan: Patient is to use CBT and coping skills to decrease triggered responses.  Patient is to make sure that she gets up and walks or does some form of physical release of her emotions when she spends lots of time with her mother.  Patient is to work on releasing negative emotions appropriately. Short-term goal: Practice implement relaxation training as a coping mechanism for tension panic stress anger and anxiety Long-term goal: Develop and implement coping skills to carry out normal responsibilities and participate constructively in [place], [facility]

## 2020-02-28 ENCOUNTER — Other Ambulatory Visit: Payer: Self-pay | Admitting: Psychiatry

## 2020-02-28 DIAGNOSIS — F4312 Post-traumatic stress disorder, chronic: Secondary | ICD-10-CM

## 2020-03-12 ENCOUNTER — Ambulatory Visit: Payer: 59 | Admitting: Psychiatry

## 2020-03-14 ENCOUNTER — Other Ambulatory Visit: Payer: Self-pay | Admitting: Podiatry

## 2020-03-23 ENCOUNTER — Ambulatory Visit (INDEPENDENT_AMBULATORY_CARE_PROVIDER_SITE_OTHER): Payer: 59 | Admitting: Psychiatry

## 2020-03-23 DIAGNOSIS — F4310 Post-traumatic stress disorder, unspecified: Secondary | ICD-10-CM | POA: Diagnosis not present

## 2020-03-23 NOTE — Progress Notes (Signed)
[facility] Counselor/Therapist Progress Note  Patient ID: Diana Ayers, MRN: [id number],    Date: 03/23/2020  Time Spent: 51 minutes start time 1:01 PM end time 1:52 PM Virtual Visit via Telephone Note Connected with patient by a video enabled telemedicine/telehealth application , with their informed consent, and verified patient privacy and that I am speaking with the correct person using two identifiers. I discussed the limitations, risks, security and privacy concerns of performing psychotherapy and management service by telephone and the availability of in person appointments. I also discussed with the patient that there may be a patient responsible charge related to this service. The patient expressed understanding and agreed to proceed. I discussed the treatment planning with the patient. The patient was provided an opportunity to ask questions and all were answered. The patient agreed with the plan and demonstrated an understanding of the instructions. The patient was advised to call  our office if  symptoms worsen or feel they are in a crisis state and need immediate contact.   Therapist Location: office Patient Location: home    Treatment Type: Individual Therapy  Reported Symptoms: anxiety, sadness, aggegation, triggered response, hypervigilant, easily startled  Mental Status Exam:  Appearance:   Casual     Behavior:  Appropriate  Motor:  Normal  Speech/Language:   Normal Rate  Affect:  Appropriate  Mood:  normal  Thought process:  normal  Thought content:    WNL  Sensory/Perceptual disturbances:    WNL  Orientation:  oriented to person, place and time/date  Attention:  Good  Concentration:  Good  Memory:  WNL  Fund of knowledge:   Good  Insight:    Good  Judgment:   Good  Impulse Control:  Good   Risk Assessment: Danger to Self:  No Self-injurious Behavior: No Danger to Others: No Duty to Warn:no Physical Aggression / Violence:No  Access to  Firearms a concern: No  Gang Involvement:No   Subjective: Met with patient via virtual session.  She shared she had COVID and had been out of work until this week and is still not 100 % but is better. She shared that her mother was kind during the situation which was a good thing. She and her mother are watching a TV show together that has allowed them to have some different communications which has been good. She shared she did get back on a roller coaster of her mother that has been stressful.  She shared that the last guy that came out was professional but there other ones were not. She went on to share her mother did the push pull thing with her and that made her frustrated again. Patient was encouraged to practice her box theory and make sure she is not taking on things that aren't hers. Patient was encouraged to remember that her mother is not in good health and that is going to impact her cognitively so she needs to be mindful of that when she is interacting with her mother. 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  Encouraged her to start using her CBT skills to talk her self through different noises that are very triggering for her.  Encouraged her to just try and spend  some time outside and if she hears sirens barking dogs any noises that are triggering to do some breathing and talk her self through them.  Patient agreed to try and work on that over the next few weeks.  Interventions: Cognitive Behavioral Therapy and Solution-Oriented/Positive Psychology  Diagnosis:   ICD-10-CM   1. PTSD (post-traumatic stress disorder)  F43.10     Plan: Patient is to use CBT and coping skills to decrease triggered responses.  Patient is to start trying to talk  herself through different noises that are very triggering for her.  Patient is to continue working on setting appropriate limits with her mother and reminding herself that she is enough.  Patient is to work on exercising regularly to release negative emotions. Long-term goal: Develop and implement effective coping skills to carry out normal responsibilities and participate constructively in relationships Short-term goal: Practice implement relaxation training as a coping mechanism for tension panic stress anger and anxiety   , LCMHC                   

## 2020-03-27 ENCOUNTER — Other Ambulatory Visit: Payer: Self-pay | Admitting: Psychiatry

## 2020-03-27 DIAGNOSIS — F4312 Post-traumatic stress disorder, chronic: Secondary | ICD-10-CM

## 2020-03-30 ENCOUNTER — Ambulatory Visit: Payer: 59 | Admitting: Psychiatry

## 2020-04-09 ENCOUNTER — Ambulatory Visit (INDEPENDENT_AMBULATORY_CARE_PROVIDER_SITE_OTHER): Payer: 59 | Admitting: Psychiatry

## 2020-04-09 DIAGNOSIS — F4312 Post-traumatic stress disorder, chronic: Secondary | ICD-10-CM

## 2020-04-09 NOTE — Progress Notes (Signed)
[facility] Counselor/Therapist Progress Note  Patient ID: Diana Ayers, MRN: [id number],    Date: 04/09/2020  Time Spent: 50 minutes start time 2:04 PM end time 2:54 PM Virtual Visit via Telephone Note Connected with patient by a video enabled telemedicine/telehealth application or telephone, with their informed consent, and verified patient privacy and that I am speaking with the correct person using two identifiers. I discussed the limitations, risks, security and privacy concerns of performing psychotherapy and management service by telephone and the availability of in person appointments. I also discussed with the patient that there may be a patient responsible charge related to this service. The patient expressed understanding and agreed to proceed. I discussed the treatment planning with the patient. The patient was provided an opportunity to ask questions and all were answered. The patient agreed with the plan and demonstrated an understanding of the instructions. The patient was advised to call  our office if  symptoms worsen or feel they are in a crisis state and need immediate contact.   Therapist Location: office Patient Location: mall in [place]    Treatment Type: Individual Therapy  Reported Symptoms: triggered responses, anxiety, frustrated, sadness  Mental Status Exam:  Appearance:   Casual     Behavior:  Appropriate  Motor:  Normal  Speech/Language:   Normal Rate  Affect:  Appropriate  Mood:  normal  Thought process:  normal  Thought content:    WNL  Sensory/Perceptual disturbances:    WNL  Orientation:  oriented to person, place, time/date and situation  Attention:  Good  Concentration:  Good  Memory:  WNL  Fund of knowledge:   Good  Insight:    Good  Judgment:   Good  Impulse Control:  Good   Risk Assessment: Danger to Self:  No Self-injurious Behavior: No Danger to Others: No Duty to Warn:no Physical Aggression / Violence:No  Access to  Firearms a concern: No  Gang Involvement:No   Subjective: Met with patient via virtual session.  She shared that her mother had gone into the hospital on Friday and she had to go and help her. She is on her way back currently.  She shared that her mother was okay. She shared she has had some good talks with her mother.  She shared that her sister is going through her second round of breast cancer and she is trying to support her through it. She shared that her depression is getting better. Patient went on to share that she is realizing the things she can't control she can obsessively try to fix it. Discussed visuals to help her maintain a healthy distance from the situation. Patient agreed to work on it to help her not have an increase in anxiety and depression during this time. She has rejoined her Bible study and that has been positive for her and that has been encouraging for her. She has also been going to back into her relaxing ex a PTSD support group. The group triggered memories of her time going to [place] meetings with her mother.  Recommended at this time she stick with the groups that are giving her a sense of hope and not triggering her.  Patient recognizes that is to be the best option currently.  Also discussed the importance of getting back into her relaxing exercises to release negative emotions appropriately  Interventions: Cognitive Behavioral Therapy and Solution-Oriented/Positive Psychology  Diagnosis:   ICD-10-CM   1. Chronic post-traumatic stress disorder (PTSD)  F43.12  Plan: Patient is to use CBT and coping skills to decrease triggered responses.  Patient is to continue in the support groups.  She is to be kind to her family even when it is hard.  Patient is to participate in exercise to release negative emotions appropriately.  Patient is to take medication as directed Long-term goal: Develop and implement effective coping skills to carry out normal responsibilities and  participate constructively in relationships Short-term goal: Practice implement relaxation training as a coping mechanism for tension panic stress anger and anxiety  [name], [facility]

## 2020-04-23 ENCOUNTER — Other Ambulatory Visit: Payer: Self-pay

## 2020-04-23 ENCOUNTER — Ambulatory Visit (INDEPENDENT_AMBULATORY_CARE_PROVIDER_SITE_OTHER): Payer: 59 | Admitting: Psychiatry

## 2020-04-23 DIAGNOSIS — F4312 Post-traumatic stress disorder, chronic: Secondary | ICD-10-CM | POA: Diagnosis not present

## 2020-04-23 NOTE — Progress Notes (Signed)
      [facility] Counselor/Therapist Progress Note  Patient ID: Diana Ayers, MRN: [id number],    Date: 04/23/2020  Time Spent: 52 minutes start time 1:06 PM end time 1:58 PM  Treatment Type: Individual Therapy  Reported Symptoms: agitation, anxiety, triggered responses, sadness  Mental Status Exam:  Appearance:   Casual and Neat     Behavior:  Appropriate  Motor:  Normal  Speech/Language:   Normal Rate  Affect:  Appropriate  Mood:  anxious  Thought process:  normal  Thought content:    WNL  Sensory/Perceptual disturbances:    WNL  Orientation:  oriented to person, place, time/date and situation  Attention:  Good  Concentration:  Good  Memory:  WNL  Fund of knowledge:   Good  Insight:    Good  Judgment:   Good  Impulse Control:  Good   Risk Assessment: Danger to Self:  No Self-injurious Behavior: No Danger to Others: No Duty to Warn:no Physical Aggression / Violence:No  Access to Firearms a concern: No  Gang Involvement:No   Subjective: Patient was present for session.  She shared she has been working with a PTSD support group and some things have been triggered through it.  Her mother and sister are having more health issues. She and her roommate are having some issues.  She is going to have to have eye surgery again.  Encourage patient to try and focus on the positive things that are happening.  Encouraged her to realize that she has a lot of positives going on and she is done a lot of things right.  Patient was able to recognize that her work situation is positive and they seem to be pleased with her performance.  She also shared that her sister recognizes that she is there for her and has talked to her about taking care of her during her surgery.  Patient also shared that she and her roommate to live together for 25 years and she is only charging her 350 a month including all utilities.  Discussed how that was a very positive deal and if her roommate chooses to  move out that that would be fine and not impact her greatly.  Patient was encouraged to engage more in her other relationships and to get back to participating in her groups in person and not just online.     Interventions: Solution-Oriented/Positive Psychology  Diagnosis:   ICD-10-CM   1. Chronic post-traumatic stress disorder (PTSD)  F43.12     Plan: Patient is to use CBT and coping skills to decrease triggered responses.  Patient is to focus on the positives in her life.  Patient is to reengage with her groups in person and not just online.  Patient is to continue taking medication as directed. Long term goal: Develop and implement effective coping skills to carry out normal responsibilities and participate constructively in relationships Short term goal:Practice implement relaxation training as a coping mechanism for tension panic stress anger and anxiety  [name], [facility]

## 2020-05-10 LAB — HEMOGLOBIN A1C
Hgb A1c MFr Bld: 5.3 % of total Hgb (ref <5.7)
Mean Plasma Glucose: 105 mg/dL
eAG (mmol/L): 5.8 mmol/L

## 2020-05-10 LAB — VITAMIN D 25 HYDROXY (VIT D DEFICIENCY, FRACTURES): Vit D, 25-Hydroxy: 27 ng/mL — ABNORMAL LOW (ref 30–100)

## 2020-05-14 ENCOUNTER — Other Ambulatory Visit: Payer: Self-pay

## 2020-05-14 ENCOUNTER — Encounter (INDEPENDENT_AMBULATORY_CARE_PROVIDER_SITE_OTHER): Payer: Self-pay | Admitting: Otolaryngology

## 2020-05-14 ENCOUNTER — Ambulatory Visit (INDEPENDENT_AMBULATORY_CARE_PROVIDER_SITE_OTHER): Payer: 59 | Admitting: Otolaryngology

## 2020-05-14 VITALS — Temp 97.5°F

## 2020-05-14 DIAGNOSIS — H6122 Impacted cerumen, left ear: Secondary | ICD-10-CM | POA: Diagnosis not present

## 2020-05-14 DIAGNOSIS — H60312 Diffuse otitis externa, left ear: Secondary | ICD-10-CM | POA: Diagnosis not present

## 2020-05-14 NOTE — Progress Notes (Signed)
HPI: Diana Ayers is a 60 y.o. female who presents for evaluation of questionable foreign body in the left ear.  She has been using a Q-tip in the left ear and the left ear feels swollen and is little bit tender and blocked.  She has not complained of any pain.  This has been going on for several days now. 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  Some sounds will induce this as well as uncomfortable to her.  Past Medical History:  Diagnosis Date  . Anxiety   . Depression   . FHx: malignant neoplasm of breast in first degree relative 07/23/2011  . Hyperlipidemia   . PTSD (post-traumatic stress disorder)   . Thyroid disease    Past Surgical History:  Procedure Laterality Date  . BREAST BIOPSY    . EYE SURGERY    . TOTAL MASTECTOMY     Social History   Socioeconomic History  . Marital status: Single    Spouse name: Not on file  . Number of children: 0  . Years of education: Not on file  . Highest education level: Bachelor's degree (e.g., BA, AB, BS)  Occupational History  . Not on file  Tobacco Use  . Smoking status: Never Smoker  . Smokeless tobacco: Never Used  Vaping Use  . Vaping Use: Never used  Substance and Sexual Activity  . Alcohol use: No  . Drug use: No  . Sexual activity: Not Currently  Other Topics Concern  . Not on file  Social History Narrative  . Not on file   Social Determinants of Health   Financial Resource Strain: Not on file  Food Insecurity: Not on file  Transportation Needs: Not on file  Physical Activity: Not on file  Stress: Not on file  Social Connections: Not on file   Family History  Problem Relation Age of Onset  . Breast cancer Mother 58       BRCA/BART negative  . Lung cancer Mother 61       smoker  . Alcohol abuse Mother   . Breast cancer Sister 41  . Melanoma Sister 74  . Dementia Father   . Breast cancer Maternal Aunt 70  . Breast cancer Sister 70       BRCA-/no BART   . Lung cancer Maternal Uncle        smoker  . Lung cancer Maternal Uncle        smoker  . Heart attack Maternal Aunt   . Schizophrenia Maternal Aunt   . Schizophrenia Maternal Aunt   . Schizophrenia Maternal Uncle    Allergies  Allergen Reactions  . Onion Other (See Comments)  . Tape Other (See Comments)    RASH, PT ALSO STATES RASH WITH COBAN   Prior to Admission medications   Medication Sig Start Date End Date Taking? Authorizing Provider  carbamazepine (CARBATROL) 300 MG 12 hr capsule Take 2 capsules (600 mg total) by mouth at bedtime. 02/22/20 05/22/20  Thayer Headings, PMHNP  cholecalciferol (VITAMIN D3) 25 MCG (1000 UNIT) tablet Take 2,000 Units by mouth daily.    [provider]  clonazePAM (KLONOPIN) 1 MG tablet TAKE THREE TABLETS BY MOUTH EVERY NIGHT AT BEDTIME AND TAKE 1 TO 2 TABLETS AS NEEDED FOR ANXIETY *MUST LAST 30 DAYS* 03/27/20   Thayer Headings, PMHNP  diclofenac Sodium (VOLTAREN) 1 % GEL Voltaren 1 % topical gel    [provider]  L-Methylfolate  15 MG TABS Take 1 tablet (15 mg total) by mouth daily. 02/22/20   Thayer Headings, PMHNP  L-Methylfolate-Algae (DEPLIN 15) 15-90.314 MG CAPS Take 15 mg by mouth daily. 12/20/19   Thayer Headings, PMHNP  loratadine (CLARITIN) 10 MG tablet Take 10 mg by mouth daily as needed.  Patient not taking: Reported on 02/22/2020    [provider]  meloxicam (MOBIC) 15 MG tablet TAKE ONE TABLET BY MOUTH DAILY 03/14/20   Criselda Peaches, DPM  Multiple Vitamins-Minerals (MULTIVITAMIN GUMMIES ADULT PO) Take by mouth.    [provider]  SUMAtriptan (IMITREX) 100 MG tablet Take 1 tablet (100 mg total) by mouth once as needed for migraine or headache. May repeat in 2 hours if headache persists or recurs. 02/22/20 02/21/21  Thayer Headings, PMHNP  traZODone (DESYREL) 100 MG tablet Take 2-3 tabs po QHS 02/28/20   Thayer Headings, PMHNP  venlafaxine XR (EFFEXOR XR) 150 MG 24 hr capsule Take 2 capsules (300 mg total) by mouth  daily with breakfast. 02/22/20   Thayer Headings, PMHNP  vitamin C (ASCORBIC ACID) 250 MG tablet Take 250 mg by mouth daily.    [provider]     Positive ROS: Otherwise negative  All other systems have been reviewed and were otherwise negative with the exception of those mentioned in the HPI and as above.  Physical Exam: Constitutional: Alert, well-appearing, no acute distress Ears: External ears without lesions or tenderness. Ear canals are small bilaterally.  She has some wax in the right ear canal that was cleaned with suction.  The left ear canal is completely occluded with cerumen in mild inflammatory changes.  This again was cleaned with suction and hydroperoxide.  She has slight inflammatory changes of the ear canal making the opening smaller.  However the TM is clear.  After cleaning the left ear canal with suction I applied gentian violet, Ciprodex and CSF powder to the left ear only. Nasal: External nose without lesions.. Clear nasal passages Oral: Lips and gums without lesions. Tongue and palate mucosa without lesions. Posterior oropharynx clear. Neck: No palpable adenopathy or masses Respiratory: Breathing comfortably  Skin: No facial/neck lesions or rash noted.  Cerumen impaction removal  Date/Time: 05/14/2020 6:06 PM Performed by: Rozetta Nunnery, MD Authorized by: Rozetta Nunnery, MD   Consent:    Consent obtained:  Verbal   Consent given by:  Patient   Risks discussed:  Pain and bleeding Procedure details:    Location:  L ear and R ear   Procedure type: suction   Post-procedure details:    Inspection:  TM intact and canal normal   Hearing quality:  Improved   Patient tolerance of procedure:  Tolerated well, no immediate complications Comments:     Left ear canal was occluded with debris and mild infection.  This was cleaned with suction and hydroperoxide as well as alcohol or ear rinses.  After cleaning the ear canal I applied gentian violet,  Ciprodex and CSF powder to the left ear only.  Right ear canal had minimal cerumen that was cleaned with suction.    Assessment: Left ear canal completely occluded with debris from use of Q-tip as well as mild inflammatory changes.  Plan: Left ear canal was cleaned and applied gentian violet, Ciprodex and CSF powder to the left ear.  Recommend keeping it dry for the next 24 hours. Cautioned her about not using Q-tips.  She will follow-up as needed. Concerning the noise irritating her ears would recommend  seeing audiology for hearing test.  Concerning the posttraumatic stress syndrome would recommend follow-up with psychiatry.  Radene Journey, MD

## 2020-05-21 ENCOUNTER — Encounter: Payer: Self-pay | Admitting: Psychiatry

## 2020-05-21 ENCOUNTER — Ambulatory Visit (INDEPENDENT_AMBULATORY_CARE_PROVIDER_SITE_OTHER): Payer: 59 | Admitting: Psychiatry

## 2020-05-21 ENCOUNTER — Other Ambulatory Visit: Payer: Self-pay

## 2020-05-21 DIAGNOSIS — F32A Depression, unspecified: Secondary | ICD-10-CM

## 2020-05-21 DIAGNOSIS — F4312 Post-traumatic stress disorder, chronic: Secondary | ICD-10-CM | POA: Diagnosis not present

## 2020-05-21 DIAGNOSIS — E559 Vitamin D deficiency, unspecified: Secondary | ICD-10-CM

## 2020-05-21 MED ORDER — CARBAMAZEPINE ER 300 MG PO CP12: 600.0000 mg | ORAL_CAPSULE | Freq: Every day | ORAL | 1 refills | Status: DC

## 2020-05-21 MED ORDER — VENLAFAXINE HCL ER 150 MG PO CP24: 300.0000 mg | ORAL_CAPSULE | Freq: Every day | ORAL | 1 refills | Status: DC

## 2020-05-21 MED ORDER — VITAMIN D (ERGOCALCIFEROL) 1.25 MG (50000 UNIT) PO CAPS: 50000.0000 [IU] | ORAL_CAPSULE | ORAL | 0 refills | Status: DC

## 2020-05-21 MED ORDER — CLONAZEPAM 1 MG PO TABS: ORAL_TABLET | ORAL | 2 refills | Status: DC

## 2020-05-21 NOTE — Progress Notes (Signed)
ZAMARIA RUMMELL [id number] 23-Jun-1960 60 y.o.  Subjective:   Patient ID:  Diana Ayers is a 60 y.o. (DOB 1960-06-14) female.  Chief Complaint:  Chief Complaint  Patient presents with  . Anxiety  . Depression    HPI NAKEITHA FARNEY presents to the office today for follow-up of anxiety and depression. She reports that she feels like her mood is "getting better." She has been completing a 12-week faith based PTSD course. She reports that she had to tell her story recently and this has brought up some memories. She has had some nightmares without night terrors. She reports that she has some difficulty staying asleep. Denies difficulty falling asleep. She reports that she tends to wake up around 7-8 am. Considering making her sleep schedule more consistent. Occasionally takes naps. Anxiety "comes and goes." She reports that she takes Klonopin prn about 80% of days. She reports that she is less likely to be agitated or irritable if she takes Klonopin. She reports exaggerated startle response. She reports that she immediately startled and drew her fist when someone sneaked up in her space. Appetite has been ok. She reports, "I think I am a chocolate addict." She reports that she would lie to lose 10-15 lbs. She reports that she is very focused when fishing. Denies SI.   She reports that her energy is slightly better. She has been fishing more. She has been working less. Motivation has been ok.   Her roommate had COVID and she received time off. She became symptomatic mid-February and was sick for a week.   Oldest sister has breast cancer again and is planning to have a mastectomy and reconstruction in mid-June and [name] will be her caregiver. Mother is getting ready to have a colonoscopy due to chronic anemia. [name] continues to take care of her mother.   Past Psychiatric Medication Trials: Klonopin- Reports that she was once on 6 mg total daily dose and has decreased to 3 mg.  Reports Klonopin has been helpful for anxiety.  Trazodone- Effective. Has some excessive daytime somnolence. Latuda Effexor XR- Has been helpful for mood and anxiety.  Prozac Paxil Zoloft Wellbutrin Carbamazepine XR- Has been helpful for episodic agitation/irritability/aggression Depakote- Helpful  Nortriptyline   PHQ2-9   Flowsheet Row Nutrition [facility] 05/05/2017 [facility] Nutrition from 08/03/2014 in [facility] Nutrition from 06/16/2014 in [facility]  PHQ-2 Total Score 0 6 3       Review of Systems:  Review of Systems  Gastrointestinal: Negative.   Musculoskeletal: Negative for gait problem.  Neurological: Negative for tremors and headaches.  Psychiatric/Behavioral:       Please refer to HPI    Medications: I have reviewed the patient's current medications.  Current Outpatient Medications  Medication Sig Dispense Refill  . cholecalciferol (VITAMIN D3) 25 MCG (1000 UNIT) tablet Take 2,000 Units by mouth daily.    . diclofenac Sodium (VOLTAREN) 1 % GEL Voltaren 1 % topical gel    . L-Methylfolate 15 MG TABS Take 1 tablet (15 mg total) by mouth daily. 30 tablet 5  . L-Methylfolate-Algae (DEPLIN 15) 15-90.314 MG CAPS Take 15 mg by mouth daily. 30 capsule 0  . loratadine (CLARITIN) 10 MG tablet Take 10 mg by mouth daily as needed.    . meloxicam (MOBIC) 15 MG tablet TAKE ONE TABLET BY MOUTH DAILY 30 tablet 3  . Multiple Vitamins-Minerals (MULTIVITAMIN GUMMIES ADULT PO) Take by mouth.    Marland Kitchen  SUMAtriptan (IMITREX) 100 MG tablet Take 1 tablet (100 mg total) by mouth once as needed for migraine or headache. May repeat in 2 hours if headache persists or recurs. 10 tablet 1  . traZODone (DESYREL) 100 MG tablet Take 2-3 tabs po QHS 90 tablet 2  . vitamin C (ASCORBIC ACID) 250 MG tablet Take 250 mg by mouth daily.    . Vitamin D, Ergocalciferol, (DRISDOL) 1.25 MG (50000 UNIT) CAPS capsule Take 1  capsule (50,000 Units total) by mouth every 7 (seven) days. 12 capsule 0  . carbamazepine (CARBATROL) 300 MG 12 hr capsule Take 2 capsules (600 mg total) by mouth at bedtime. 180 capsule 1  . [START ON 07/02/2020] clonazePAM (KLONOPIN) 1 MG tablet TAKE THREE TABLETS BY MOUTH EVERY NIGHT AT BEDTIME AND TAKE 1 TO 2 TABLETS AS NEEDED FOR ANXIETY *MUST LAST 30 DAYS* 120 tablet 2  . venlafaxine XR (EFFEXOR XR) 150 MG 24 hr capsule Take 2 capsules (300 mg total) by mouth daily with breakfast. 180 capsule 1   No current facility-administered medications for this visit.    Medication Side Effects: None  Allergies:  Allergies  Allergen Reactions  . Onion Other (See Comments)  . Tape Other (See Comments)    RASH, PT ALSO STATES RASH WITH COBAN    Past Medical History:  Diagnosis Date  . Anxiety   . Depression   . FHx: malignant neoplasm of breast in first degree relative 07/23/2011  . Hyperlipidemia   . PTSD (post-traumatic stress disorder)   . Thyroid disease     Past Medical History, Surgical history, Social history, and Family history were reviewed and updated as appropriate.   Please see review of systems for further details on the patient's review from today.   Objective:   Physical Exam:  There were no vitals taken for this visit.  Physical Exam Constitutional:      General: She is not in acute distress. Musculoskeletal:        General: No deformity.  Neurological:     Mental Status: She is alert and oriented to person, place, and time.     Coordination: Coordination normal.  Psychiatric:        Attention and Perception: Attention and perception normal. She does not perceive auditory or visual hallucinations.        Mood and Affect: Mood is anxious. Mood is not depressed. Affect is not labile, blunt, angry or inappropriate.        Speech: Speech normal.        Behavior: Behavior normal.        Thought Content: Thought content normal. Thought content is not paranoid or  delusional. Thought content does not include homicidal or suicidal ideation. Thought content does not include homicidal or suicidal plan.        Cognition and Memory: Cognition and memory normal.        Judgment: Judgment normal.     Comments: Insight intact     Lab Review:     Component Value Date/Time   NA 138 11/21/2019 1205   NA 140 07/20/2008 1030   K 4.3 11/21/2019 1205   K 3.9 07/20/2008 1030   CL 101 11/21/2019 1205   CL 101 07/20/2008 1030   CO2 30 11/21/2019 1205   CO2 30 07/20/2008 1030   GLUCOSE 98 11/21/2019 1205   GLUCOSE 106 07/20/2008 1030   BUN 11 11/21/2019 1205   BUN 13 07/20/2008 1030   CREATININE 0.65 11/21/2019 1205  CALCIUM 9.9 11/21/2019 1205   CALCIUM 9.2 07/20/2008 1030   PROT 7.0 11/21/2019 1205   PROT 7.5 07/20/2008 1030   ALBUMIN 4.3 07/23/2011 1427   ALBUMIN 3.7 07/20/2008 1030   AST 16 11/21/2019 1205   AST 22 07/20/2008 1030   ALT 14 11/21/2019 1205   ALT 22 07/20/2008 1030   ALKPHOS 55 07/23/2011 1427   ALKPHOS 67 07/20/2008 1030   BILITOT 0.4 11/21/2019 1205   BILITOT 0.50 07/20/2008 1030       Component Value Date/Time   WBC 5.5 11/21/2019 1205   RBC 4.58 11/21/2019 1205   HGB 14.6 11/21/2019 1205   HGB 13.3 07/23/2011 1427   HCT 42.8 11/21/2019 1205   HCT 40.8 07/23/2011 1427   PLT 210 11/21/2019 1205   PLT 196 07/23/2011 1427   MCV 93.4 11/21/2019 1205   MCV 95.0 07/23/2011 1427   MCH 31.9 11/21/2019 1205   MCHC 34.1 11/21/2019 1205   RDW 11.8 11/21/2019 1205   RDW 12.4 07/23/2011 1427   LYMPHSABS 2.3 07/23/2011 1427   MONOABS 0.5 07/23/2011 1427   EOSABS 0.2 07/23/2011 1427   EOSABS 0.2 07/20/2008 1030   BASOSABS 0.1 07/23/2011 1427    No results found for: POCLITH, LITHIUM   Lab Results  Component Value Date   CBMZ 8.4 11/21/2019     .res Assessment: Plan:    Pt seen for 30 minutes and time spent counseling pt regarding results of Vitamin D lab. Discussed that there has been minimal improvement in Vitamin  D level despite supplementation. Will therefore start Vitamin D 50,000 IU weekly to stabilize Vitamin D levels, particularly since she reports family h/o osteoporosis.  Will re-check Vitamin D level in 3 months prior to next apt.  Continue carbamazepine ER 600 mg at bedtime for mood and anxiety. Continue Effexor XR 300 mg daily for depression and anxiety. Continue Klonopin 1 mg 3 tablets at bedtime and 1 to 2 tablets as needed for anxiety. Continue L-methylfolate for augmentation of depression. Continue trazodone 200-300 milligrams at bedtime for insomnia. Recommend continuing psychotherapy with [name], [facility]. Patient to follow-up with this provider in 3 months or sooner if clinically indicated. Patient advised to contact office with any questions, adverse effects, or acute worsening in signs and symptoms.   Annaliesa was seen today for anxiety and depression.  Diagnoses and all orders for this visit:  Vitamin D deficiency -     Vitamin D, Ergocalciferol, (DRISDOL) 1.25 MG (50000 UNIT) CAPS capsule; Take 1 capsule (50,000 Units total) by mouth every 7 (seven) days. -     VITAMIN D 25 Hydroxy (Vit-D Deficiency, Fractures)  Depression, unspecified depression type -     carbamazepine (CARBATROL) 300 MG 12 hr capsule; Take 2 capsules (600 mg total) by mouth at bedtime. -     venlafaxine XR (EFFEXOR XR) 150 MG 24 hr capsule; Take 2 capsules (300 mg total) by mouth daily with breakfast.  Chronic post-traumatic stress disorder (PTSD) -     clonazePAM (KLONOPIN) 1 MG tablet; TAKE THREE TABLETS BY MOUTH EVERY NIGHT AT BEDTIME AND TAKE 1 TO 2 TABLETS AS NEEDED FOR ANXIETY *MUST LAST 30 DAYS* -     venlafaxine XR (EFFEXOR XR) 150 MG 24 hr capsule; Take 2 capsules (300 mg total) by mouth daily with breakfast.     Please see After Visit Summary for patient specific instructions.  Future Appointments  Date Time Provider Wall  06/07/2020  9:00 AM [name], [facility] CP-CP  None   06/21/2020 11:00 AM [name], [facility] CP-CP None  07/02/2020 10:00 AM [name], [facility] CP-CP None  07/16/2020 11:00 AM [name], [facility] CP-CP None  07/25/2020 11:00 AM [name], [facility] CP-CP None  08/08/2020 11:00 AM [name], [facility] CP-CP None  08/13/2020 10:30 AM Thayer Headings, PMHNP CP-CP None    Orders Placed This Encounter  Procedures  . VITAMIN D 25 Hydroxy (Vit-D Deficiency, Fractures)    -------------------------------

## 2020-06-07 ENCOUNTER — Ambulatory Visit (INDEPENDENT_AMBULATORY_CARE_PROVIDER_SITE_OTHER): Payer: 59 | Admitting: Psychiatry

## 2020-06-07 ENCOUNTER — Other Ambulatory Visit: Payer: Self-pay

## 2020-06-07 DIAGNOSIS — F4312 Post-traumatic stress disorder, chronic: Secondary | ICD-10-CM | POA: Diagnosis not present

## 2020-06-07 NOTE — Progress Notes (Signed)
[facility] Counselor/Therapist Progress Note  Patient ID: Diana Ayers, MRN: [id number],    Date: 06/07/2020  Time Spent: 51 minutes start time 9:09 AM end time 10 AM  Treatment Type: Individual Therapy  Reported Symptoms: anxiety, triggered responses,nightmares, flashbacks   Mental Status Exam:  Appearance:   Casual     Behavior:  Appropriate  Motor:  Normal  Speech/Language:   Normal Rate  Affect:  Appropriate  Mood:  normal  Thought process:  normal  Thought content:    WNL  Sensory/Perceptual disturbances:    WNL  Orientation:  oriented to person, place, time/date and situation  Attention:  Good  Concentration:  Good  Memory:  WNL  Fund of knowledge:   Good  Insight:    Good  Judgment:   Good  Impulse Control:  Good   Risk Assessment: Danger to Self:  No Self-injurious Behavior: No Danger to Others: No Duty to Warn:no Physical Aggression / Violence:No  Access to Firearms a concern: No  Gang Involvement:No   Subjective: Patient was present for session.  She shared that she had cataract surgery last week and is doing well.  Her sister has breat cancer again and has decided to have a double mastectomy. Patient shared that she is going to be her care giver and that is causing anxiety for her. Patient shared she just finished her PTSD course and that was helpful.  Patient reported that through the class she was able to write out her whole story of her trauma from childhood all the way to current.  She shared that that was helpful for her.  She reported that she would like to share that with clinician and agreed that she could bring it to next session.  Patient acknowledged that clinician knew most of the different situations already and many had been worked on in treatment.  Patient explained that her surgery triggered memories from childhood when she was left in the hospital at age 60 because her mother had to go home to be with her siblings.  Patient explained  her mother has told her that at 1 point the nurses had to put her in a cold shower because her fever got to such a high level.  Patient explained that the frustrating thing for her is that when her mother had a hangover she would tell her she had the flu and would have patient going get things for her even though when patient was sick she was not there for her.  Patient was encouraged to remind herself that her mother just cannot meet anyone's needs other than her own and that is not patient's fault.  Acknowledge that realizing it is not personal does help even though it is still difficult.  Encouraged patient to affirm herself and set limits with mother currently.  Discussed the fact that when patient's sister is in recovery her mother will probably have an episode where she needs patient and it will be important for patient to set a limit at that time.  Shared she has been going fishing and enjoys that.  She shared it is hard because she finds herself doing it on her own.  Encouraged patient to think of others that she could take with their or do other things with because she does need to develop more of a support network.  Patient was encouraged to think through what she would like to start processing again in treatment to be addressed at next session.  Interventions:  Solution-Oriented/Positive Psychology  Diagnosis:   ICD-10-CM   1. Chronic post-traumatic stress disorder (PTSD)  F43.12     Plan: Patient is to use CBT and coping skills to decrease triggered responses.  Patient is to continue working on setting limits with her mother.  Patient is to find ways to release negative emotions appropriately i.e. fishing.  Patient is to work on relationships to develop a bigger support network.  Patient is to take medication as directed. Long term goal: Develop and implement effective coping skills to carry out normal responsibilities and participate constructively in relationships Short term goal:Practice  implement relaxation training as a coping mechanism for tension panic stress anger and anxiety  [name], [facility]

## 2020-06-21 ENCOUNTER — Ambulatory Visit (INDEPENDENT_AMBULATORY_CARE_PROVIDER_SITE_OTHER): Payer: 59 | Admitting: Psychiatry

## 2020-06-21 ENCOUNTER — Other Ambulatory Visit: Payer: Self-pay

## 2020-06-21 DIAGNOSIS — F4312 Post-traumatic stress disorder, chronic: Secondary | ICD-10-CM | POA: Diagnosis not present

## 2020-06-21 NOTE — Progress Notes (Signed)
[facility] Counselor/Therapist Progress Note  Patient ID: Diana Ayers, MRN: [id number],    Date: 06/21/2020  Time Spent: 57 minutes start time 11:05 AM end time 12:02 PM  Treatment Type: Individual Therapy  Reported Symptoms: anxiety, triggered resopnses  Mental Status Exam:  Appearance:   Casual and Neat     Behavior:  Appropriate  Motor:  Normal  Speech/Language:   Normal Rate  Affect:  Appropriate  Mood:  normal  Thought process:  normal  Thought content:    WNL  Sensory/Perceptual disturbances:    WNL  Orientation:  oriented to person, place, time/date and situation  Attention:  Good  Concentration:  Good  Memory:  WNL  Fund of knowledge:   Good  Insight:    Good  Judgment:   Good  Impulse Control:  Good   Risk Assessment: Danger to Self:  No Self-injurious Behavior: No Danger to Others: No Duty to Warn:no Physical Aggression / Violence:No  Access to Firearms a concern: No  Gang Involvement:No   Subjective: Patient was present for session.  She shared she is preparing to take care of her sister after her surgery. She shared she was able to tell her younger sister that she would be on mom duty due to having to take care of their sister.  She also let her mother know that she could not call patient to help and would have to call her sister. She went on to share she has some concerns about the stay because they have some differences in life styles and her sister is going to have needs and she often does not verbalize them.  She shared she is working on being thankful for simple things like she was able to have eye surgery and she can see better again.  She has also been retired from the police department for 17 years and that has been triggering for her but she is managing the triggers okay.She shared she is trying to remind herself her sister is going through a tough journey and she wants to help her get through it and be positive.  Discussed different ways  that she can support her sister and take care of herself while she is helping her through the recovery process.  Also discussed different ways to continue setting the appropriate limits with her mother and other sister during this time.  Patient reported she is having some difficulties with her roommate.  Discussed those different situations and ways for her to stay grounded and maintain perspective.  Patient was able to explain several of the things that she has been working on have been helping especially as she has been working on being more positive and thankful.  Continuing to put the right stuff in her brain was discussed with patient.  Patient also acknowledged having a structure with work was a positive thing for her and she will continue to work even though not as many hours through the summer.  Interventions: Cognitive Behavioral Therapy and Solution-Oriented/Positive Psychology  Diagnosis:   ICD-10-CM   1. Chronic post-traumatic stress disorder (PTSD)  F43.12     Plan: Patient is to use CBT and coping skills to decrease triggered responses.  Patient is to work on focusing on positives and things that she can be thankful for even very small things to help keep her thoughts moving in a positive direction.  Patient is to continue setting limits with her mother and younger sister.  Patient is to use self  talk when interacting with her current roommate.  Patient is to continue working through the summer  at a reduced rate. Long term goal: Develop and implement effective coping skills to carry out normal responsibilities and participate constructively in relationships Short term goal:Practice implement relaxation training as a coping mechanism for tension panic stress anger and anxiety  Diana Ayers, [facility]

## 2020-06-27 ENCOUNTER — Telehealth: Payer: Self-pay | Admitting: Psychiatry

## 2020-06-27 DIAGNOSIS — E559 Vitamin D deficiency, unspecified: Secondary | ICD-10-CM

## 2020-06-27 NOTE — Telephone Encounter (Signed)
I spoke with [facility] again on this.The diagnosis code being changed won't help. We need to provide medical records, scientific evidence that shows medical necessity , that it is warranted for pt's condition. Can upload records on [facility] provider portal or fax to [facility]: FAX: [phone] to attn: Ref# [id number]/ [facility] claim # is [id number]. This can be reviewed for charge. Per Cleone Slim at [facility]

## 2020-06-27 NOTE — Telephone Encounter (Signed)
Please let her know it would be E55.9, Vitamin Deficiency.

## 2020-06-27 NOTE — Telephone Encounter (Signed)
Insurance called and left a message asking for a re coding for a lab for a vitamin d or a diagnosis so that this lab can be covered as a preventive. Please let pt know at [phone]

## 2020-06-28 NOTE — Telephone Encounter (Signed)
Issue is she got billed for the April recheck of the [facility] says they need to have medical necessity so is it ok to send in the last few notes?

## 2020-07-02 ENCOUNTER — Ambulatory Visit (INDEPENDENT_AMBULATORY_CARE_PROVIDER_SITE_OTHER): Payer: 59 | Admitting: Psychiatry

## 2020-07-02 DIAGNOSIS — F4310 Post-traumatic stress disorder, unspecified: Secondary | ICD-10-CM | POA: Diagnosis not present

## 2020-07-02 NOTE — Progress Notes (Addendum)
[facility] Counselor/Therapist Progress Note  Patient ID: Diana Ayers, MRN: [id number],    Date: 07/02/2020  Time Spent: 52 minutes start time 10:08 Amend time 11:00 Virtual Visit via Telephone Note Connected with patient by a video enabled telemedicine/telehealth application with their informed consent, and verified patient privacy and that I am speaking with the correct person using two identifiers. I discussed the limitations, risks, security and privacy concerns of performing psychotherapy and management service by telephone and the availability of in person appointments. I also discussed with the patient that there may be a patient responsible charge related to this service. The patient expressed understanding and agreed to proceed. I discussed the treatment planning with the patient. The patient was provided an opportunity to ask questions and all were answered. The patient agreed with the plan and demonstrated an understanding of the instructions. The patient was advised to call  our office if  symptoms worsen or feel they are in a crisis state and need immediate contact.   Therapist Location: office Patient Location: home    Treatment Type: Individual Therapy  Reported Symptoms: anxiety, triggered responses, agitation  Mental Status Exam:  Appearance:   Casual and Neat     Behavior:  Appropriate  Motor:  Normal  Speech/Language:   Normal Rate  Affect:  Appropriate  Mood:  anxious  Thought process:  normal  Thought content:    WNL  Sensory/Perceptual disturbances:    WNL  Orientation:  oriented to person, place, time/date, and situation  Attention:  Good  Concentration:  Good  Memory:  WNL  Fund of knowledge:   Good  Insight:    Good  Judgment:   Good  Impulse Control:  Good   Risk Assessment: Danger to Self:  No Self-injurious Behavior: No Danger to Others: No Duty to Warn:no Physical Aggression / Violence:No  Access to Firearms a concern: No  Gang  Involvement:No   Subjective: Met with patient via virtual session.  She shared she is preparing for her sister's upcoming surgery.  She explained that her sister's boyfriend has decided that he wants to be there some so it is hard for her to know how to handle the situation.  She shared that it has made everything very stressful for her.  She was encouraged attend the doctor appointments since she is the one that will be with her sister for the next 2 weeks caring for her.  She went on to share that her mother is wanting to help clean her sister's home even though her sister doesn't want her and patient is having to set limits with her. Had patient think through what she will be dealing with emotionally as she is with her sister in the hospital and how she looks after surgery.  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 Patient also shared that she is going to work on enjoying the time she has with her nephew even though it is under a hard circumstance.  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 She shared she is concerned that her agitation was part of the issue. Encouraged patient to  recognize that it may just be good for her to find a new practice and move forward.    Interventions: Solution-Oriented/Positive Psychology  Diagnosis:   ICD-10-CM   1. PTSD (post-traumatic stress disorder)  F43.10       Plan: Patient is to use CBT and coping skills to decrease triggered responses.  She is to use grounding techniques regularly to try and maintain during sister's surgery and recovery. Patient is to continue setting  limits with her mother.   Long term goal: Develop and implement effective coping skills to carry out normal responsibilities and participate constructively in relationships Short term goal:Practice implement relaxation training as a coping mechanism for tension panic stress anger and anxiety    [name], [facility]

## 2020-07-10 ENCOUNTER — Other Ambulatory Visit: Payer: Self-pay | Admitting: Podiatry

## 2020-07-16 ENCOUNTER — Ambulatory Visit (INDEPENDENT_AMBULATORY_CARE_PROVIDER_SITE_OTHER): Payer: 59 | Admitting: Psychiatry

## 2020-07-16 DIAGNOSIS — F4312 Post-traumatic stress disorder, chronic: Secondary | ICD-10-CM

## 2020-07-16 NOTE — Progress Notes (Signed)
[facility] Counselor/Therapist Progress Note  Patient ID: Diana Ayers, MRN: [id number],    Date: 07/16/2020  Time Spent: 52 minutes start time 11:09 AM end time 12:01 Virtual Visit via Telephone Note Connected with patient by a video enabled telemedicine/telehealth application, with their informed consent, and verified patient privacy and that I am speaking with the correct person using two identifiers. I discussed the limitations, risks, security and privacy concerns of performing psychotherapy and management service by telephone and the availability of in person appointments. I also discussed with the patient that there may be a patient responsible charge related to this service. The patient expressed understanding and agreed to proceed. I discussed the treatment planning with the patient. The patient was provided an opportunity to ask questions and all were answered. The patient agreed with the plan and demonstrated an understanding of the instructions. The patient was advised to call  our office if  symptoms worsen or feel they are in a crisis state and need immediate contact.   Therapist Location: office Patient Location: mother's house    Treatment Type: Individual Therapy  Reported Symptoms: sadness, anxiety, hypervigilance, fear,   Mental Status Exam:  Appearance:   Casual and Neat     Behavior:  Appropriate  Motor:  Normal  Speech/Language:   Normal Rate  Affect:  Appropriate  Mood:  normal  Thought process:  normal  Thought content:    WNL  Sensory/Perceptual disturbances:    WNL  Orientation:  oriented to person, place, time/date, and situation  Attention:  Good  Concentration:  Good  Memory:  Immediate;   [name] of knowledge:   Good  Insight:    Good  Judgment:   Good  Impulse Control:  Good   Risk Assessment: Danger to Self:  No Self-injurious Behavior: No Danger to Others: No Duty to Warn:no Physical Aggression / Violence:No  Access to  Firearms a concern: No  Gang Involvement:No   Subjective: Met with patient via virtual session. She shared that things are going well so far with her sister's recovery. She reported that it was hard when she got out of surgery and that was hard for her because sister was afraid that she was paralyzed but she wasn't.  Patient shared she was able to help her work through it.  There was a conflict with her boyfriend, but patient was able to work through it. Her mother is struggling with not getting the attention that she wants but patient is setting limits which is a good thing.  She shared she has been trying to focus on how to help her sister and even though it has been a lot. Patient stated that she has been able to help with her drains which has been very bloody and patient stated she has not gotten triggered.  Patient was encouraged to feel good about that since it is progress. 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 She did report that someone snuck up behind her and she was ready to fight, even though she was able to calm herself.  Patient was encouraged to recognize all the positive things that she is to read and how she has been able to set limits without being overly emotional which is positive growth.  Patient was encouraged to continue thinking about the leadership role with the [email or web address] program.  Patient  explained she was trying to figure out how to go on a trip with her roommate.  Patient discussed the situation and was encouraged to recognize that she was handling it appropriately even though it was still very stressful and was encouraged to continue making the choices that she was making.  Interventions: Solution-Oriented/Positive Psychology  Diagnosis:   ICD-10-CM   1. Chronic post-traumatic stress disorder (PTSD)  F43.12       Plan: Patient is to use CBT and coping skills to  continue to decrease triggered responses.  Patient is to continue working on limit setting with others.  Patient is to focus on the things that she can control fix and change.  Patient is to take medication as directed Long term goal: Develop and implement effective coping skills to carry out normal responsibilities and participate constructively in relationships Short term goal:Practice implement relaxation training as a coping mechanism for tension panic stress anger and anxiety     [name], [facility]

## 2020-07-25 ENCOUNTER — Ambulatory Visit: Payer: 59 | Admitting: Psychiatry

## 2020-08-08 ENCOUNTER — Other Ambulatory Visit: Payer: Self-pay

## 2020-08-08 ENCOUNTER — Ambulatory Visit (INDEPENDENT_AMBULATORY_CARE_PROVIDER_SITE_OTHER): Payer: 59 | Admitting: Psychiatry

## 2020-08-08 DIAGNOSIS — F4312 Post-traumatic stress disorder, chronic: Secondary | ICD-10-CM

## 2020-08-08 NOTE — Progress Notes (Signed)
      [facility] Counselor/Therapist Progress Note  Patient ID: Diana Ayers, MRN: [id number],    Date: 08/08/2020  Time Spent: 52 minutes start time 11:08 AM end time 12:00 PM  Treatment Type: Individual Therapy  Reported Symptoms: sadness, triggered responses, grief issues, crying spells, flashbacks, anxiety, sleep issues  Mental Status Exam:  Appearance:   Casual and Neat     Behavior:  Appropriate  Motor:  Normal  Speech/Language:   Normal Rate  Affect:  Appropriate  Mood:  sad  Thought process:  normal  Thought content:    WNL  Sensory/Perceptual disturbances:    WNL  Orientation:  oriented to person, place, time/date, and situation  Attention:  Good  Concentration:  Good  Memory:  WNL  Fund of knowledge:   Good  Insight:    Good  Judgment:   Good  Impulse Control:  Good   Risk Assessment: Danger to Self:  No Self-injurious Behavior: No Danger to Others: No Duty to Warn:no Physical Aggression / Violence:No  Access to Firearms a concern: No  Gang Involvement:No   Subjective: Patient was present for session.  She shared that she is back home after taking care of her sister  over the past few weeks.  She shared she is realizing she has to get back to her life and recognizes it will be a transition.  She shared that she got triggered while she was there and couldn't talk to anyone about it. She explained that she got poison ivy while she was there. She had to put her sister's cat down while she was there which triggered her PTSD which is connected to death. Her mother got COVID and it was hard because she couldn't go help her. She called her doctor to try and get her more medicine and her mother got angry with her and refused to go to the ER.She was able to maintain perspective and not fight with her about the situation.  Patient was encouraged to feel positive about the changes she is making and the way she is trying to interact differently with her mother even when  she gets triggered.  Patient was encouraged to remind herself of the positive growth that she is making.  Different ways to continue working on coping skills were discussed with patient.  Patient stated she is back to her Bible study and trying to interact with friends at church more.  She is to get back to fishing and walking as soon as the heat declined some.  Interventions: Cognitive Behavioral Therapy and Solution-Oriented/Positive Psychology  Diagnosis:   ICD-10-CM   1. Chronic post-traumatic stress disorder (PTSD)  F43.12       Plan: Patient is to use CBT and coping skills to decrease triggered responses.  Patient is to continue affirming herself regularly and finding ways to release negative emotions appropriately.  Patient is to take medication as directed. Long term goal: Develop and implement effective coping skills to carry out normal responsibilities and participate constructively in relationships Short term goal:Practice implement relaxation training as a coping mechanism for tension panic stress anger and anxiety  [name], [facility]

## 2020-08-10 ENCOUNTER — Other Ambulatory Visit: Payer: Self-pay | Admitting: Psychiatry

## 2020-08-10 DIAGNOSIS — E559 Vitamin D deficiency, unspecified: Secondary | ICD-10-CM

## 2020-08-13 ENCOUNTER — Ambulatory Visit: Payer: 59 | Admitting: Psychiatry

## 2020-08-13 NOTE — Telephone Encounter (Signed)
Please review

## 2020-08-21 ENCOUNTER — Ambulatory Visit: Payer: 59 | Admitting: Psychiatry

## 2020-08-28 ENCOUNTER — Telehealth: Payer: Self-pay | Admitting: Psychiatry

## 2020-08-28 NOTE — Telephone Encounter (Signed)
This is a follow up on the Vit D level that keeps getting denied by her insurance. She is hoping you can help her with this. [employer] states that you need to do a peer- peer review with [employer] to justify why it was medically necessary to get her Vit D tested. There is a DOS 4/20 that was her second level you requested and it got denied. Can they go back and approve? She also needs to get a Pre Auth to get her Vit D level checked again before her next appt. With you. Please let me know when this is done and I'll notify the patient. You can document the notes in a chart note. [facility] peer review # [phone]. If I need to help, let me know.

## 2020-08-28 NOTE — Telephone Encounter (Signed)
Contacted [facility] Peer Review line at [phone].  Informed that no pre-authorization is on file and that this department assist with requesting preauthorization.  Representative reports that there are 2 options to seek coverage of labs drawn on 05/09/20. 1) to contact [phone] and request a retroactive pre-authorization/ pre-notification for lab or 2) contacting the same number and appealing the denial.  Please contact this number to request either a retroactive preauthorization or appeal.  Vitamin D level was reordered and obtained on 05/09/20 after patient had a vitamin D level of 23 on November 21, 2019 and had reported limited improvement in energy and mood after initiating vitamin D supplementation.  Subsequent vitamin D level indicated that patient's vitamin D level remained low at 27.

## 2020-08-29 NOTE — Telephone Encounter (Signed)
Reviewed

## 2020-09-04 ENCOUNTER — Encounter: Payer: Self-pay | Admitting: Psychiatry

## 2020-09-04 ENCOUNTER — Ambulatory Visit: Payer: 59 | Admitting: Psychiatry

## 2020-09-04 ENCOUNTER — Telehealth: Payer: Self-pay | Admitting: Psychiatry

## 2020-09-04 ENCOUNTER — Other Ambulatory Visit: Payer: Self-pay

## 2020-09-04 DIAGNOSIS — F4312 Post-traumatic stress disorder, chronic: Secondary | ICD-10-CM | POA: Diagnosis not present

## 2020-09-04 DIAGNOSIS — F32A Depression, unspecified: Secondary | ICD-10-CM | POA: Diagnosis not present

## 2020-09-04 MED ORDER — TRAZODONE HCL 100 MG PO TABS: ORAL_TABLET | ORAL | 2 refills | Status: DC

## 2020-09-04 MED ORDER — CARBAMAZEPINE ER 300 MG PO CP12: 600.0000 mg | ORAL_CAPSULE | Freq: Every day | ORAL | 1 refills | Status: DC

## 2020-09-04 MED ORDER — L-METHYLFOLATE 15 MG PO TABS
15.0000 mg | ORAL_TABLET | Freq: Every day | ORAL | 5 refills | Status: AC
Start: 2020-09-04 — End: ?

## 2020-09-04 MED ORDER — CLONAZEPAM 1 MG PO TABS: ORAL_TABLET | ORAL | 2 refills | Status: DC

## 2020-09-04 MED ORDER — VENLAFAXINE HCL ER 150 MG PO CP24: 300.0000 mg | ORAL_CAPSULE | Freq: Every day | ORAL | 1 refills | Status: DC

## 2020-09-04 NOTE — Telephone Encounter (Signed)
Talked with pt about this during her visit. She said that she provided the CPT code to the front desk when she checked in. It would be helpful to know what [employer]'s parameters are for the frequency of covering Vit D levels, ie. Is it only covered if it has been a certain length of time from the last lab and if so, how long that needs to be.

## 2020-09-04 NOTE — Telephone Encounter (Signed)
Called [facility] [phone] to try to obtain current authorization for Vit D level for pt and also to get retro auth for Vit D to go back to pay for 05/09/20.  Re: 05/09/20 Auth -Per Leilani Able at [facility] [place]- "[employer] cannot give retroauthorization for any outpt services." Notified pt about this and she will pursue, but knows it may not get covered. Re: Pre auth for current Vit D level needed still- We need to know the CPT code for Vit D level, which pt will get to me, and then we can call [phone] to see if there needs to be a pre-auth for it. Need NPI provider #, CPT code, DX code.

## 2020-09-04 NOTE — Progress Notes (Signed)
SERAFINA EMMANUEL [id number] Feb 04, 1960 60 y.o.  Subjective:   Patient ID:  Diana Ayers is a 60 y.o. (DOB August 22, 1960) female.  Chief Complaint:  Chief Complaint  Patient presents with   Anxiety   Depression    Anxiety    Depression        Past medical history includes anxiety.   DAVAYAH PIGMAN presents to the office today for follow-up of anxiety and depression. Oldest sister was dx'd with recurrence of breast cancer and had double mastectomy June 15th. She went to care for sister for 6 weeks after sister's surgery and took leave from her work. 4 weeks into sister's recovery their mother got COVID. Mother then has had diverticulitis flares x 2 and is currently in the hospital. 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 She reports, "I'm exhausted" from caring for everyone. She had to take sister's cat to the vet and the cat had to be euthanized and this triggered past traumatic experiences involving death. She has been feeling anxious. She reports that she had some chest tightness yesterday in response to stressors. She reports some depression. "I'm just tired of fighting everything." She reports some fatigue that may also be related to poor sleep. Typically sleeping only 5 hours. She is enjoying some outside activities. She reports 10 lb weight gain. She reports poor concentration. She denies SI.  Took a PTSD course online in the spring which was helpful "but also opened up some things." She reports that she has not been sleeping as well.   Reports that mother vacillates from asking for help and then being help rejecting and passive aggressive.   She has returned to work and reports this has been helpful for her to get her mind off things. 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    Past Psychiatric Medication Trials: Klonopin- Reports that she was once on 6 mg total daily dose and has decreased to 3 mg. Reports Klonopin has been helpful for anxiety.  Trazodone- Effective. Has some excessive daytime somnolence. Latuda Effexor XR- Has been helpful for mood and anxiety.  Prozac Paxil Zoloft Wellbutrin Carbamazepine XR- Has been helpful for episodic agitation/irritability/aggression Depakote- Helpful  Nortriptyline  PHQ2-9    Flowsheet Row Nutrition [facility]05/[facility] Nutrition from 08/03/2014 in [facility] Nutrition from 06/16/2014 in [facility]  PHQ-2 Total Score 0 6 3        Review of Systems:  Review of Systems  Eyes:        Improved vision  Musculoskeletal:  Negative for gait problem.  Neurological:  Negative for tremors.  Psychiatric/Behavioral:  Positive for depression.        Please refer to HPI   Medications: I have reviewed the patient's current medications.  Current Outpatient Medications  Medication Sig Dispense Refill   cholecalciferol (VITAMIN D3) 25 MCG (1000 UNIT) tablet Take 2,000 Units by mouth daily.     Vitamin D, Ergocalciferol, (DRISDOL) 1.25 MG (50000 UNIT) CAPS capsule TAKE ONE CAPSULE BY MOUTH ONCE WEEKLY 12 capsule 0   carbamazepine (CARBATROL) 300 MG 12 hr capsule Take 2 capsules (600 mg total) by mouth at bedtime. 180 capsule 1   [START ON 09/13/2020] clonazePAM (KLONOPIN) 1 MG tablet TAKE THREE  TABLETS BY MOUTH EVERY NIGHT AT BEDTIME AND TAKE 1 TO 2 TABLETS AS NEEDED FOR ANXIETY *MUST LAST 30 DAYS* 120 tablet 2   diclofenac Sodium (VOLTAREN) 1 % GEL Voltaren 1 % topical gel     L-Methylfolate 15 MG TABS Take 1 tablet (15 mg total) by mouth daily. 30 tablet 5   L-Methylfolate-Algae (DEPLIN 15) 15-90.314 MG CAPS Take 15 mg by mouth daily. 30 capsule 0   loratadine (CLARITIN) 10 MG tablet Take 10 mg by mouth daily as needed.      meloxicam (MOBIC) 15 MG tablet TAKE ONE TABLET BY MOUTH DAILY 30 tablet 3   Multiple Vitamins-Minerals (MULTIVITAMIN GUMMIES ADULT PO) Take by mouth.     SUMAtriptan (IMITREX) 100 MG tablet Take 1 tablet (100 mg total) by mouth once as needed for migraine or headache. May repeat in 2 hours if headache persists or recurs. 10 tablet 1   traZODone (DESYREL) 100 MG tablet Take 2-3 tabs po QHS 90 tablet 2   venlafaxine XR (EFFEXOR XR) 150 MG 24 hr capsule Take 2 capsules (300 mg total) by mouth daily with breakfast. 180 capsule 1   vitamin C (ASCORBIC ACID) 250 MG tablet Take 250 mg by mouth daily.     No current facility-administered medications for this visit.    Medication Side Effects: None  Allergies:  Allergies  Allergen Reactions   Onion Other (See Comments)   Tape Other (See Comments)    RASH, PT ALSO STATES RASH WITH COBAN    Past Medical History:  Diagnosis Date   Anxiety    Depression    FHx: malignant neoplasm of breast in first degree relative 07/23/2011   Hyperlipidemia    PTSD (post-traumatic stress disorder)    Thyroid disease     Past Medical History, Surgical history, Social history, and Family history were reviewed and updated as appropriate.   Please see review of systems for further details on the patient's review from today.   Objective:   Physical Exam:  There were no vitals taken for this visit.  Physical Exam Constitutional:      General: She is not in acute distress. Musculoskeletal:        General: No deformity.  Neurological:     Mental Status: She is alert and oriented to person, place, and time.     Coordination: Coordination normal.  Psychiatric:        Attention and Perception: Attention and perception normal. She does not perceive auditory or visual hallucinations.        Mood and Affect: Mood is anxious. Mood is not depressed. Affect is not labile, blunt, angry or inappropriate.        Speech: Speech normal.        Behavior: Behavior  normal.        Thought Content: Thought content normal. Thought content is not paranoid or delusional. Thought content does not include homicidal or suicidal ideation. Thought content does not include homicidal or suicidal plan.        Cognition and Memory: Cognition and memory normal.        Judgment: Judgment normal.     Comments: Insight intact    Lab Review:     Component Value Date/Time   NA 138 11/21/2019 1205   NA 140 07/20/2008 1030   K 4.3 11/21/2019 1205   K 3.9 07/20/2008 1030   CL 101 11/21/2019 1205   CL 101 07/20/2008 1030   CO2 30 11/21/2019 1205  CO2 30 07/20/2008 1030   GLUCOSE 98 11/21/2019 1205   GLUCOSE 106 07/20/2008 1030   BUN 11 11/21/2019 1205   BUN 13 07/20/2008 1030   CREATININE 0.65 11/21/2019 1205   CALCIUM 9.9 11/21/2019 1205   CALCIUM 9.2 07/20/2008 1030   PROT 7.0 11/21/2019 1205   PROT 7.5 07/20/2008 1030   ALBUMIN 4.3 07/23/2011 1427   ALBUMIN 3.7 07/20/2008 1030   AST 16 11/21/2019 1205   AST 22 07/20/2008 1030   ALT 14 11/21/2019 1205   ALT 22 07/20/2008 1030   ALKPHOS 55 07/23/2011 1427   ALKPHOS 67 07/20/2008 1030   BILITOT 0.4 11/21/2019 1205   BILITOT 0.50 07/20/2008 1030       Component Value Date/Time   WBC 5.5 11/21/2019 1205   RBC 4.58 11/21/2019 1205   HGB 14.6 11/21/2019 1205   HGB 13.3 07/23/2011 1427   HCT 42.8 11/21/2019 1205   HCT 40.8 07/23/2011 1427   PLT 210 11/21/2019 1205   PLT 196 07/23/2011 1427   MCV 93.4 11/21/2019 1205   MCV 95.0 07/23/2011 1427   MCH 31.9 11/21/2019 1205   MCHC 34.1 11/21/2019 1205   RDW 11.8 11/21/2019 1205   RDW 12.4 07/23/2011 1427   LYMPHSABS 2.3 07/23/2011 1427   MONOABS 0.5 07/23/2011 1427   EOSABS 0.2 07/23/2011 1427   EOSABS 0.2 07/20/2008 1030   BASOSABS 0.1 07/23/2011 1427    No results found for: POCLITH, LITHIUM   Lab Results  Component Value Date   CBMZ 8.4 11/21/2019     .res Assessment: Plan:   Pt seen for 30 minutes and time spent discussing Vitamin D  labs and issue with insurance covering repeat Vitamin D lab. Will continue to contact insurance to appeal non-coverage of Vitamin D lab and inquire when insurance may cover a repeat lab since [place] would like to have lab re-checked to determine if level is now WNL since level increased only slightly after initial supplementation and she continues to have some s/s of low mood and energy.  Provided validation that she has had multiple stressors and trauma triggers recently that would affect her mood and anxiety s/s. Agreed that therapy will continue to be beneficial to process these events.  Discussed continuing current medications with close monitoring.  Pt to follow-up with this provider in 3 months or sooner if clinically indicated.  Patient advised to contact office with any questions, adverse effects, or acute worsening in signs and symptoms.  Shabana was seen today for anxiety and depression.  Diagnoses and all orders for this visit:  Chronic post-traumatic stress disorder (PTSD) -     clonazePAM (KLONOPIN) 1 MG tablet; TAKE THREE TABLETS BY MOUTH EVERY NIGHT AT BEDTIME AND TAKE 1 TO 2 TABLETS AS NEEDED FOR ANXIETY *MUST LAST 30 DAYS* -     venlafaxine XR (EFFEXOR XR) 150 MG 24 hr capsule; Take 2 capsules (300 mg total) by mouth daily with breakfast. -     traZODone (DESYREL) 100 MG tablet; Take 2-3 tabs po QHS  Depression, unspecified depression type -     venlafaxine XR (EFFEXOR XR) 150 MG 24 hr capsule; Take 2 capsules (300 mg total) by mouth daily with breakfast. -     L-Methylfolate 15 MG TABS; Take 1 tablet (15 mg total) by mouth daily. -     carbamazepine (CARBATROL) 300 MG 12 hr capsule; Take 2 capsules (600 mg total) by mouth at bedtime.    Please see After Visit Summary for patient  specific instructions.  Future Appointments  Date Time Provider [place]  09/19/2020 11:00 AM [name], [facility] CP-CP None  09/26/2020 12:00 PM [name], [facility] CP-CP None  10/17/2020  11:00 AM [name], [facility] CP-CP None  12/05/2020 11:00 AM Thayer Headings, [facility] CP-CP None    No orders of the defined types were placed in this encounter.   -------------------------------

## 2020-09-05 ENCOUNTER — Ambulatory Visit: Payer: 59 | Admitting: Psychiatry

## 2020-09-05 DIAGNOSIS — F4312 Post-traumatic stress disorder, chronic: Secondary | ICD-10-CM | POA: Diagnosis not present

## 2020-09-05 NOTE — Progress Notes (Signed)
[facility] Counselor/Therapist Progress Note  Patient ID: Diana Ayers, MRN: [id number],    Date: 09/05/2020  Time Spent: 58 minutes start time 11:11 AM end time 12:09  Treatment Type: Individual Therapy  Reported Symptoms: anxious, overwhelmed, muscle tension, sadness, triggered responses, sleep issues, fatigue  Mental Status Exam:  Appearance:   Casual     Behavior:  Appropriate  Motor:  Normal  Speech/Language:   Normal Rate  Affect:  Appropriate  Mood:  anxious and sad  Thought process:  normal  Thought content:    WNL  Sensory/Perceptual disturbances:    WNL  Orientation:  oriented to person, place, time/date, and situation  Attention:  Good  Concentration:  Good  Memory:  WNL  Fund of knowledge:   Good  Insight:    Good  Judgment:   Good  Impulse Control:  Good   Risk Assessment: Danger to Self:  No Self-injurious Behavior: No Danger to Others: No Duty to Warn:no Physical Aggression / Violence:No  Access to Firearms a concern: No  Gang Involvement:No   Subjective: Patient was present for session.  She shared she was struggling.  She explained she did the PTSD group which was good but she went to take care of her sister after that, during that time she had to put her cat down, her mother got COVID and she couldn't really help her due to taking care of her sister.  At the end of her COVID mother started have abdominal pain and had to go to the ER and she has diverticulitis and is was admitted to the hospital.She is out now and patient stated she is trying to figure out how to deal with situation. She stated she is accepting that her mother is going to do what she wants to do and it doesn't matter what she does.  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 She stated she is ready to retire from that job with her family due to being exhausted and anxious all the time.  Patient did EMDR set on issues  with her mom that have been triggering her, picture mom saying "I need you", suds level 9, negative cognition "I have to make it better" felt anxiety in her chest.  Patient was able to reduce suds level to 5.  She was able to identify the fact that her mother has told patient and her sisters since childhood that she had them so that they could take care of her.  Patient stated she feels like her mother is the ringmaster and she and her sisters are the monkeys that are controlled.  Patient was able to realize at the end of session her fear about her mother is that if her mother is not around she will have a relationship with her sisters.  Discussed the fact that she and her sisters do things without her mother and are connected even when her mother is not around.  Encouraged patient to talk to her sisters about the issue and make sure they have a plan that feels good and that knows that they will continue to have a relationship even if something happens to their mother.  Interventions: Cognitive Behavioral Therapy, Solution-Oriented/Positive Psychology, Eye Movement Desensitization and Reprocessing (EMDR), and Insight-Oriented  Diagnosis:   ICD-10-CM   1. Chronic post-traumatic stress disorder (PTSD)  F43.12       Plan: Patient is to use CBT and  coping skills to decrease triggered responses.  Patient is to talk with her sisters about their relationship to make sure it is intact even if something happens to her their mother.  Patient is to engage in activities that are relaxing and calming for her.  Patient is to continue work on setting limits with her mother.  Patient is to take medication as directed. Long term goal: Develop and implement effective coping skills to carry out normal responsibilities and participate constructively in relationships Short term goal:Practice implement relaxation training as a coping mechanism for tension panic stress anger and anxiety    [name],  [facility]

## 2020-09-11 ENCOUNTER — Telehealth: Payer: Self-pay | Admitting: Psychiatry

## 2020-09-11 NOTE — Telephone Encounter (Signed)
Office manager called  [employer]  [phone] if reference to pt request to get a PA for Vit D level. [employer] has told us that no preauth is necessary. The Vit D level, CPT code [phone], is valid and billable without an [place]. Not sure why she was told it needed PA.  Instead, I did do a pre-determination for the Vit D level as recommended by [facility] rep. Predetermination # [id number] was placed for Vit D level valid thru 12/10/20.

## 2020-09-19 ENCOUNTER — Ambulatory Visit: Payer: 59 | Admitting: Psychiatry

## 2020-09-19 ENCOUNTER — Other Ambulatory Visit: Payer: Self-pay

## 2020-09-19 DIAGNOSIS — F4312 Post-traumatic stress disorder, chronic: Secondary | ICD-10-CM | POA: Diagnosis not present
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
## 2020-09-20 ENCOUNTER — Encounter: Payer: Self-pay | Admitting: Psychiatry

## 2020-09-26 ENCOUNTER — Telehealth: Payer: Self-pay | Admitting: Psychiatry

## 2020-09-26 ENCOUNTER — Ambulatory Visit (INDEPENDENT_AMBULATORY_CARE_PROVIDER_SITE_OTHER): Payer: 59 | Admitting: Psychiatry

## 2020-09-26 DIAGNOSIS — F330 Major depressive disorder, recurrent, mild: Secondary | ICD-10-CM

## 2020-09-26 NOTE — Telephone Encounter (Signed)
Diana Ayers called in with a billing question. I informed her [name] wasn't in office and she asked to leave a message with JC. Pls rtc [phone]
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
## 2020-10-11 ENCOUNTER — Telehealth: Payer: Self-pay | Admitting: Psychiatry

## 2020-10-11 NOTE — Telephone Encounter (Signed)
Thanks I will contact her tomorrow. I know Diana Ayers was checking into this too.

## 2020-10-11 NOTE — Telephone Encounter (Signed)
Pt requesting a call from you

## 2020-10-11 NOTE — Telephone Encounter (Signed)
Pt would like a call from [place]. She has a question about how she can get her vitamin d shot covered by insurance. Is there a diffrerent angle or code she could use. Please call her at [phone]

## 2020-10-12 NOTE — Telephone Encounter (Signed)
LM for her to call back

## 2020-10-15 ENCOUNTER — Telehealth: Payer: Self-pay | Admitting: Psychiatry

## 2020-10-15 NOTE — Telephone Encounter (Signed)
Patient called in today to state that some things have recently occurred that she would like to discuss. She has appt 9/28 but would like a rtc today if possible. Ph: [phone]

## 2020-10-16 NOTE — Telephone Encounter (Signed)
Diana Ayers was asking if there is another Vitamin D level that she can get to show her level but not this specific one her insurance won't cover? Last ordered was Vitamin D, 25 Hydroxy.   Pt is also contacting her insurance to see what they will cover to check her Vitamin D. She is still having fatigue. Is taking 15,000 she reports now for her Vitamin D supplement.

## 2020-10-17 ENCOUNTER — Ambulatory Visit: Payer: 59 | Admitting: Psychiatry

## 2020-10-17 ENCOUNTER — Other Ambulatory Visit: Payer: Self-pay

## 2020-10-17 DIAGNOSIS — F331 Major depressive disorder, recurrent, moderate: Secondary | ICD-10-CM

## 2020-10-17 NOTE — Progress Notes (Signed)
      [facility] Counselor/Therapist Progress Note  Patient ID: Diana Ayers, MRN: [id number],    Date: 10/17/2020  Time Spent: 51 minutes start time 11:09 AM end time 12 PM  Treatment Type: Individual Therapy  Reported Symptoms: sadness, grief, crying spells, triggered responses, sleep issues  Mental Status Exam:  Appearance:   Well Groomed     Behavior:  Appropriate  Motor:  Normal  Speech/Language:   Normal Rate  Affect:  Appropriate  Mood:  sad  Thought process:  normal  Thought content:    WNL  Sensory/Perceptual disturbances:    WNL  Orientation:  oriented to person, place, time/date, and situation  Attention:  Good  Concentration:  Good  Memory:  WNL  Fund of knowledge:   Good  Insight:    Good  Judgment:   Good  Impulse Control:  Good   Risk Assessment: Danger to Self:  No Self-injurious Behavior: No Danger to Others: No Duty to Warn:no Physical Aggression / Violence:No  Access to Firearms a concern: No  Gang Involvement:No   Subjective: Patient was present for session. She shared she is still struggling with the loss of her cat. Discussed how the cat had brought her comfort and support since she is single and typically in her home alone.  Encouraged patient to take the time to grieve her cat and when she is ready to pursue possible options for another kitten since the support of having an animal with her is so important to her.  Patient shared that her sister had come up to help her bury her cat, recognize that that was huge for her and the importance of reminding herself that she does have someone in her family who loves her very much.  Also shared she was able to set a limit with her mother during this time which was progress.  Patient also shared that she has joined a PTSD group.  She shared that it was not supposed to be a support group at an educational group.  Discussed the importance of her recognizing how to take care of herself and the importance of  her using this experience in a positive manner.  Encouraged patient to recognize that she is making progress and that she has come very far in treatment and that she just needs to continue to build on the skills that she is developing.  Interventions: Solution-Oriented/Positive Psychology and Insight-Oriented  Diagnosis:   ICD-10-CM   1. Major depressive disorder, recurrent episode, moderate ([place])  F33.1       Plan: Patient is to use CBT and coping skills to decrease depression symptoms.  Patient is to allow herself time to grieve her cat and then consider getting a new kitten.  Patient is to continue working on limit setting with her mother.  Patient is to exercise to release negative emotions appropriately.Continue in her Bible study. Continue working in her PTSD group [facility].  Patient is to take medication as directed Long-term goal: Elevate mood and show evidence of usual energy activities and socialization level Short-term goal: Identify and replace depressive thinking that leads to depressive feelings and actions  [name], [facility]

## 2020-10-18 ENCOUNTER — Telehealth: Payer: Self-pay | Admitting: Psychiatry

## 2020-10-18 NOTE — Telephone Encounter (Signed)
Pt and I discussed the Vitamin D level. She is still saying that [facility] tells her she needs a Pre Approval for new Vit D level. I will try again to call and get PA. Need to find out what to do and stress that this is a mental health issue.

## 2020-10-26 NOTE — Telephone Encounter (Signed)
Call made to [facility] [phone]- Asked again if lab VIT D will need a P.A. Per Josem Kaufmann dept it is covered under diagnostic and does not need PreAuth. Should be covered at 90% at a lab for Vit D, code [phone]. I explained her previous Vit D level was denied for no pre auth and pt has been back and forth with insurance re: this. There is no record of the pre-determination that I got on Aug 23rd per representative. Ref # [id number]. I was told it does not need Predetermination or Prior Auth again.

## 2020-10-31 ENCOUNTER — Ambulatory Visit: Payer: 59 | Admitting: Psychiatry

## 2020-11-02 ENCOUNTER — Telehealth: Payer: Self-pay | Admitting: Psychiatry

## 2020-11-02 DIAGNOSIS — E559 Vitamin D deficiency, unspecified: Secondary | ICD-10-CM

## 2020-11-02 NOTE — Telephone Encounter (Signed)
Returning to coverage for Vit D level. Patient got add'l info from [facility] on how to get the level covered. Apparently, it was the diagnosis code that was used previously that made them deny the last Vit D. Level.  If you are doing a recheck on Vit D, then need to use the specific dx code or "low vitamin D deficiency' on the lab order so that it is billed with that dx code.  ( Possibly E55.9 but you'll have to check) . Please order a Vit D level with this dx on it and we can print it and send it to patient in the mail.  With regard to the previous lab that was denied, we'll need to do a corrected diagnosis code for that and send over to [place].

## 2020-11-05 NOTE — Telephone Encounter (Signed)
Vit D level ordered with this dx code, which was used in the past. Please let me know if/what needs to be done regarding previous lab denial. Thanks.

## 2020-11-14 ENCOUNTER — Ambulatory Visit (INDEPENDENT_AMBULATORY_CARE_PROVIDER_SITE_OTHER): Payer: 59 | Admitting: Psychiatry

## 2020-11-14 DIAGNOSIS — F330 Major depressive disorder, recurrent, mild: Secondary | ICD-10-CM

## 2020-11-14 NOTE — Progress Notes (Signed)
[facility] Counselor/Therapist Progress Note  Patient ID: Diana Ayers, MRN: [id number],    Date: 11/14/2020  Time Spent: 50 minutes 12:08 PM end time end time 12:58 PM Virtual Visit via Video Note Connected with patient by a telemedicine/telehealth application, with their informed consent, and verified patient privacy and that I am speaking with the correct person using two identifiers. I discussed the limitations, risks, security and privacy concerns of performing psychotherapy and the availability of in person appointments. I also discussed with the patient that there may be a patient responsible charge related to this service. The patient expressed understanding and agreed to proceed. I discussed the treatment planning with the patient. The patient was provided an opportunity to ask questions and all were answered. The patient agreed with the plan and demonstrated an understanding of the instructions. The patient was advised to call  our office if  symptoms worsen or feel they are in a crisis state and need immediate contact.   Therapist Location: office Patient Location: Mom's home in [place]    Treatment Type: Individual Therapy  Reported Symptoms: anxiety, sadness, triggered responses, frustration, sleep issues  Mental Status Exam:  Appearance:   Casual and Neat     Behavior:  Appropriate  Motor:  Normal  Speech/Language:   Normal Rate  Affect:  Appropriate  Mood:  anxious and sad  Thought process:  circumstantial  Thought content:    WNL  Sensory/Perceptual disturbances:    WNL  Orientation:  oriented to person, place, time/date, and situation  Attention:  Good  Concentration:  Good  Memory:  WNL  Fund of knowledge:   Good  Insight:    Good  Judgment:   Good  Impulse Control:  Good   Risk Assessment: Danger to Self:  No Self-injurious Behavior: No Danger to Others: No Duty to Warn:no Physical Aggression / Violence:No  Access to Firearms a  concern: No  Gang Involvement:No   Subjective: Met with patient via virtual session.  She shared that she was in [place] again due to her mother having surgery and she is having to take care of her.  She was able to get 2 new kittens which has helped her mood. She shared she is feeling that she is in the same dance with her mother.Discussed ways to try and help her work on changing the dance with her mother.  Acknowledged how difficult it is and the importance of using her CBT skills. She shared that she is still in her PTSD group and that is bringing up some things for her even though it is a good group for her. She has realized that her incidents are all about death so she is trying to focus more on other things.  Patient was encouraged to continue working on her perspective and focusing on more positive things.  Patient was also encouraged to continue both her Bible study groups and her PTSD groups.  Patient is also to adopt the 2 new kittens and in joy time with them.  Interventions: Cognitive Behavioral Therapy and Solution-Oriented/Positive Psychology  Diagnosis:   ICD-10-CM   1. MDD (major depressive disorder), recurrent episode, mild ([place])  F33.0       Plan: Patient is to use CBT and coping skills to decrease depression symptoms.  Patient is to work on limit setting with her mother and reminding herself she has to allow her mother to be responsible for her own choices.  Patient is to participate in her  support groups.  Patient is to work on releasing negative emotions through exercise and playing with her kittens.  Patient is to take medication as directed. Long-term goal: Elevate mood and show evidence of usual energy activities and socialization level Short-term goal: Identify and replace depressive thinking that leads to depressive feelings and actions  [name], [facility]

## 2020-11-27 ENCOUNTER — Other Ambulatory Visit: Payer: Self-pay | Admitting: Podiatry

## 2020-11-28 ENCOUNTER — Ambulatory Visit: Payer: 59 | Admitting: Psychiatry

## 2020-11-29 ENCOUNTER — Ambulatory Visit (INDEPENDENT_AMBULATORY_CARE_PROVIDER_SITE_OTHER): Payer: 59 | Admitting: Psychiatry

## 2020-11-29 ENCOUNTER — Other Ambulatory Visit: Payer: Self-pay

## 2020-11-29 DIAGNOSIS — F330 Major depressive disorder, recurrent, mild: Secondary | ICD-10-CM | POA: Diagnosis not present

## 2020-11-29 NOTE — Progress Notes (Signed)
      [facility] Counselor/Therapist Progress Note  Patient ID: Diana Ayers, MRN: [id number],    Date: 11/29/2020  Time Spent: 52 minutes start time 11:08 AM end time 12 PM  Treatment Type: Individual Therapy  Reported Symptoms: depression, anxiety, triggered responses, frustration  Mental Status Exam:  Appearance:   Casual and Neat     Behavior:  Appropriate  Motor:  Normal  Speech/Language:   Normal Rate  Affect:  Appropriate  Mood:  normal  Thought process:  normal  Thought content:    WNL  Sensory/Perceptual disturbances:    WNL  Orientation:  oriented to person, place, time/date, and situation  Attention:  Good  Concentration:  Good  Memory:  WNL  Fund of knowledge:   Good  Insight:    Good  Judgment:   Good  Impulse Control:  Good   Risk Assessment: Danger to Self:  No Self-injurious Behavior: No Danger to Others: No Duty to Warn:no Physical Aggression / Violence:No  Access to Firearms a concern: No  Gang Involvement:No   Subjective: Patient was present for session.  She shared she feels her season depression has started.  She is also very stressed with her mother and her health issues.  Patient went on to share she is frustrated with herself because she feels that she has not managed all the trauma in her life the way other people may have.  Allowed her time to discuss the situation and encouraged her to recognize that she is doing the best that she can and she needs to recognize that everybody deals with things differently.  Patient was also encouraged to continue in her PTSD group and to find things that she can enjoy throughout her day.  Patient shared she is enjoying her cats and that has brought her mood in a positive direction.  Interventions: Solution-Oriented/Positive Psychology and Insight-Oriented  Diagnosis:   ICD-10-CM   1. MDD (major depressive disorder), recurrent episode, mild ([place])  F33.0       Plan: Patient is to use CBT and coping  skills to decrease depression symptoms.  Patient is to work on finding ways to have joy in her life.  Patient is to continue in her PTSD group.  Patient is to take medication as directed Long-term goal: Elevate mood and show evidence of usual energy activities and socialization level Short-term goal: Identify and replace depressive thinking that leads to depressive feelings and actions  [name], [facility]

## 2020-12-03 ENCOUNTER — Telehealth: Payer: Self-pay | Admitting: Psychiatry

## 2020-12-03 NOTE — Telephone Encounter (Signed)
[facility] office at [phone] and spoke with claims dept. Gave information for Diana Ayers's claim which was denied for Vit D level on 05/09/20 DOS. I gave a corrected dx code of E55.9 ( Vit D deficiency) and asked for claim to be resubmitted. This dx code was added by the rep, named Diana Ayers, and she resubmitted the claim for billing. Advised it will take 30-45 days to reprocess. She provided the following bill Ref #[id number].Will notify Diana Ayers of this. Will wait for reply.

## 2020-12-04 LAB — VITAMIN D 25 HYDROXY (VIT D DEFICIENCY, FRACTURES): Vit D, 25-Hydroxy: 27 ng/mL — ABNORMAL LOW (ref 30–100)

## 2020-12-04 NOTE — Progress Notes (Signed)
LVM to rtc 

## 2020-12-05 ENCOUNTER — Ambulatory Visit: Payer: 59 | Admitting: Psychiatry

## 2020-12-05 ENCOUNTER — Encounter: Payer: Self-pay | Admitting: Psychiatry

## 2020-12-05 ENCOUNTER — Other Ambulatory Visit: Payer: Self-pay

## 2020-12-05 DIAGNOSIS — F330 Major depressive disorder, recurrent, mild: Secondary | ICD-10-CM

## 2020-12-05 DIAGNOSIS — E559 Vitamin D deficiency, unspecified: Secondary | ICD-10-CM | POA: Diagnosis not present

## 2020-12-05 DIAGNOSIS — F4312 Post-traumatic stress disorder, chronic: Secondary | ICD-10-CM | POA: Diagnosis not present

## 2020-12-05 MED ORDER — TRAZODONE HCL 100 MG PO TABS: ORAL_TABLET | ORAL | 2 refills | Status: DC

## 2020-12-05 MED ORDER — REXULTI 0.5 MG PO TABS: ORAL_TABLET | ORAL | 0 refills | Status: DC

## 2020-12-05 MED ORDER — VITAMIN D (ERGOCALCIFEROL) 1.25 MG (50000 UNIT) PO CAPS
50000.0000 [IU] | ORAL_CAPSULE | ORAL | 0 refills | Status: DC
Start: 2020-12-05 — End: 2020-12-31

## 2020-12-05 MED ORDER — CLONAZEPAM 1 MG PO TABS: ORAL_TABLET | ORAL | 2 refills | Status: DC

## 2020-12-05 NOTE — Progress Notes (Signed)
Diana Ayers [id number] 1960/11/09 60 y.o.  Subjective:   Patient ID:  Diana Ayers is a 60 y.o. (DOB August 18, 1960) female.  Chief Complaint:  Chief Complaint  Patient presents with   Follow-up    Anxiety, depression    HPI Diana Ayers presents to the office today for follow-up of anxiety, depression, and insomnia. Reports "it's a stressful time of the year."   Reports that she has stress in response to dealing with her mother. Mother has had 6 hospitalizations this year and has had multiple health issues since 6. Reports that mother makes multiple demands of her and her sisters.  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 Feels that staying employed has helped her mental heath.   She is co-facilitating a PTSD class in person. She reports that this is "bringing back up stuff" related to past trauma. Avoids triggers and asks family not to tell her about bad things happening to people she does not know. Reports rumination.  Energy and motivation have been low. She rates depression a 4 out of 8 when 8=best mood possible.   Her beloved cat of 12 years died suddenly. She reports that this was a very difficult loss for her and cried intensely. Fostering 4 kittens and has adopted 2 cats. She is enjoying her kittens. Had to bury 3 cats in a year. Has been grieving losses. "I feel lonely." Has noticed some irritability. Sleeping an adequate amount but does not feel rested upon awakening. She reports anxiety interferes with restful sleep. Occ nightmares. She reports concentration is difficult. Denies SI.   Taking Klonopin 2.5 tabs at night and 1 earlier in the day. Taking Trazodone 150 mg po QHS.   Past Psychiatric Medication Trials: Klonopin- Reports that she was once on 6 mg total daily dose and has decreased to 3 mg. Reports Klonopin has been helpful for anxiety.  Trazodone- Effective. Has some excessive daytime  somnolence. Latuda Effexor XR- Has been helpful for mood and anxiety.  Prozac Paxil Zoloft Wellbutrin Carbamazepine XR- Has been helpful for episodic agitation/irritability/aggression Depakote- Helpful  Nortriptyline    PHQ2-9    Flowsheet Row Nutrition [facility] 05/05/2017 [facility] Nutrition from 08/03/2014 in [facility] Nutrition from 06/16/2014 in [facility]  PHQ-2 Total Score 0 6 3        Review of Systems:  Review of Systems  Medications: I have reviewed the patient's current medications.  Current Outpatient Medications  Medication Sig Dispense Refill   Brexpiprazole (REXULTI) 0.5 MG TABS Take 1 tablet (0.5 mg total) by mouth daily for 7 days, THEN 2 tablets (1 mg total) daily for 21 days. 30 tablet 0   cholecalciferol (VITAMIN D3) 25 MCG (1000 UNIT) tablet Take 2,000 Units by mouth daily.     L-Methylfolate 15 MG TABS Take 1 tablet (15 mg total) by mouth daily. 30 tablet 5   loratadine (CLARITIN) 10 MG tablet Take 10 mg by mouth daily as needed.     meloxicam (MOBIC) 15 MG tablet TAKE ONE TABLET BY MOUTH DAILY 30 tablet 3   Multiple Vitamins-Minerals (MULTIVITAMIN GUMMIES ADULT PO) Take by mouth.     vitamin C (ASCORBIC ACID) 250 MG tablet Take 250 mg by mouth daily.     carbamazepine (CARBATROL) 300 MG 12 hr capsule Take 2 capsules (600 mg total) by mouth at bedtime. 180 capsule 1   [START ON  12/19/2020] clonazePAM (KLONOPIN) 1 MG tablet TAKE THREE TABLETS BY MOUTH EVERY NIGHT AT BEDTIME AND TAKE 1 TO 2 TABLETS AS NEEDED FOR ANXIETY *MUST LAST 30 DAYS* 120 tablet 2   diclofenac Sodium (VOLTAREN) 1 % GEL Voltaren 1 % topical gel     L-Methylfolate-Algae (DEPLIN 15) 15-90.314 MG CAPS Take 15 mg by mouth daily. 30 capsule 0   SUMAtriptan (IMITREX) 100 MG tablet Take 1 tablet (100 mg total) by mouth once as needed for migraine or headache. May repeat in 2 hours if headache persists or  recurs. 10 tablet 1   traZODone (DESYREL) 100 MG tablet Take 2-3 tabs po QHS 90 tablet 2   venlafaxine XR (EFFEXOR XR) 150 MG 24 hr capsule Take 2 capsules (300 mg total) by mouth daily with breakfast. 180 capsule 1   Vitamin D, Ergocalciferol, (DRISDOL) 1.25 MG (50000 UNIT) CAPS capsule Take 1 capsule (50,000 Units total) by mouth once a week. 12 capsule 0   No current facility-administered medications for this visit.    Medication Side Effects: None  Allergies:  Allergies  Allergen Reactions   Onion Other (See Comments)   Tape Other (See Comments)    RASH, PT ALSO STATES RASH WITH COBAN    Past Medical History:  Diagnosis Date   Anxiety    Depression    FHx: malignant neoplasm of breast in first degree relative 07/23/2011   Hyperlipidemia    PTSD (post-traumatic stress disorder)    Thyroid disease     Past Medical History, Surgical history, Social history, and Family history were reviewed and updated as appropriate.   Please see review of systems for further details on the patient's review from today.   Objective:   Physical Exam:  There were no vitals taken for this visit.  Physical Exam Constitutional:      General: She is not in acute distress. Musculoskeletal:        General: No deformity.  Neurological:     Mental Status: She is alert and oriented to person, place, and time.     Coordination: Coordination normal.  Psychiatric:        Attention and Perception: Attention and perception normal. She does not perceive auditory or visual hallucinations.        Mood and Affect: Mood is anxious and depressed. Affect is not labile, blunt, angry or inappropriate.        Speech: Speech normal.        Behavior: Behavior normal. Behavior is cooperative.        Thought Content: Thought content normal. Thought content is not paranoid or delusional. Thought content does not include homicidal or suicidal ideation. Thought content does not include homicidal or suicidal plan.         Cognition and Memory: Cognition and memory normal.        Judgment: Judgment normal.     Comments: Insight intact Mild depression    Lab Review:     Component Value Date/Time   NA 138 11/21/2019 1205   NA 140 07/20/2008 1030   K 4.3 11/21/2019 1205   K 3.9 07/20/2008 1030   CL 101 11/21/2019 1205   CL 101 07/20/2008 1030   CO2 30 11/21/2019 1205   CO2 30 07/20/2008 1030   GLUCOSE 98 11/21/2019 1205   GLUCOSE 106 07/20/2008 1030   BUN 11 11/21/2019 1205   BUN 13 07/20/2008 1030   CREATININE 0.65 11/21/2019 1205   CALCIUM 9.9 11/21/2019 1205   CALCIUM 9.2  07/20/2008 1030   PROT 7.0 11/21/2019 1205   PROT 7.5 07/20/2008 1030   ALBUMIN 4.3 07/23/2011 1427   ALBUMIN 3.7 07/20/2008 1030   AST 16 11/21/2019 1205   AST 22 07/20/2008 1030   ALT 14 11/21/2019 1205   ALT 22 07/20/2008 1030   ALKPHOS 55 07/23/2011 1427   ALKPHOS 67 07/20/2008 1030   BILITOT 0.4 11/21/2019 1205   BILITOT 0.50 07/20/2008 1030       Component Value Date/Time   WBC 5.5 11/21/2019 1205   RBC 4.58 11/21/2019 1205   HGB 14.6 11/21/2019 1205   HGB 13.3 07/23/2011 1427   HCT 42.8 11/21/2019 1205   HCT 40.8 07/23/2011 1427   PLT 210 11/21/2019 1205   PLT 196 07/23/2011 1427   MCV 93.4 11/21/2019 1205   MCV 95.0 07/23/2011 1427   MCH 31.9 11/21/2019 1205   MCHC 34.1 11/21/2019 1205   RDW 11.8 11/21/2019 1205   RDW 12.4 07/23/2011 1427   LYMPHSABS 2.3 07/23/2011 1427   MONOABS 0.5 07/23/2011 1427   EOSABS 0.2 07/23/2011 1427   EOSABS 0.2 07/20/2008 1030   BASOSABS 0.1 07/23/2011 1427    No results found for: POCLITH, LITHIUM   Lab Results  Component Value Date   CBMZ 8.4 11/21/2019     .res Assessment: Plan:   Pt seen for 30 minutes and time spent reviewing recent Vitamin D levels and discussing that Vit D level remains low despite supplementation. Plan is to re-start Vit D 50,000 IU weekly x 12 weeks, and then 4,000 IU daily after completing weekly Vitamin D for Vitamin D  deficiency.  Discussed potential benefits, risks, and side effects of Rexulti for augmentation of depression. Discussed potential metabolic side effects associated with atypical antipsychotics, as well as potential risk for movement side effects. Advised pt to contact office if movement side effects occur. Pt agrees to trial of Rexulti. Will start Rexulti 0.5 mg po qd for one week, then increase to 1 mg po qd for augmentation of depression.  Continue Carbamazepine 600 mg at bed time for mood and anxiety.  Continue Klonopin 1 mg up to 4 tabs daily as needed for anxiety and insomnia.  Continue L-Methylfolate 15 mg po qd for augmentation of depression. Continue Effexor XR 300 mg po qd for anxiety and depression.  Continue Trazodone 100 mg 2-3 tabs po QHS for insomnia. Pt to follow-up in one month or sooner if clinically indicated.  Patient advised to contact office with any questions, adverse effects, or acute worsening in signs and symptoms.   Diana Ayers was seen today for follow-up.  Diagnoses and all orders for this visit:  MDD (major depressive disorder), recurrent episode, mild ([facility]) -     Brexpiprazole (REXULTI) 0.5 MG TABS; Take 1 tablet (0.5 mg total) by mouth daily for 7 days, THEN 2 tablets (1 mg total) daily for 21 days.  Vitamin D deficiency -     Vitamin D, Ergocalciferol, (DRISDOL) 1.25 MG (50000 UNIT) CAPS capsule; Take 1 capsule (50,000 Units total) by mouth once a week.  Chronic post-traumatic stress disorder (PTSD) -     clonazePAM (KLONOPIN) 1 MG tablet; TAKE THREE TABLETS BY MOUTH EVERY NIGHT AT BEDTIME AND TAKE 1 TO 2 TABLETS AS NEEDED FOR ANXIETY *MUST LAST 30 DAYS* -     traZODone (DESYREL) 100 MG tablet; Take 2-3 tabs po QHS    Please see After Visit Summary for patient specific instructions.  Future Appointments  Date Time Provider [place]  12/12/2020 11:00 AM [name], [facility] CP-CP None  12/26/2020 11:00 AM [name], [facility] CP-CP None  12/31/2020   9:30 AM Thayer Headings, PMHNP CP-CP None  01/02/2021 11:00 AM [name], [facility] CP-CP None  02/05/2021 12:00 PM [name], [facility] CP-CP None  02/13/2021 12:00 PM [name], [facility] CP-CP None  02/20/2021 11:00 AM [name], [facility] CP-CP None  03/06/2021 11:00 AM [name], [facility] CP-CP None    No orders of the defined types were placed in this encounter.   -------------------------------

## 2020-12-07 NOTE — Progress Notes (Signed)
LVM to RC. Result has been viewed in [place].

## 2020-12-11 NOTE — Progress Notes (Signed)
Patient said she had seen Janett Billow since this lab and that "they were good."

## 2020-12-12 ENCOUNTER — Ambulatory Visit: Payer: 59 | Admitting: Psychiatry

## 2020-12-12 ENCOUNTER — Other Ambulatory Visit: Payer: Self-pay

## 2020-12-12 DIAGNOSIS — F330 Major depressive disorder, recurrent, mild: Secondary | ICD-10-CM | POA: Diagnosis not present

## 2020-12-12 NOTE — Progress Notes (Signed)
      [facility] Counselor/Therapist Progress Note  Patient ID: Diana Ayers, MRN: [id number],    Date: 12/12/2020  Time Spent: 59 minutes start time 11:08 Amend time 12:07 PM  Treatment Type: Individual Therapy  Reported Symptoms: triggered responses, anxiety, fatigue, depression, bad dreams  Mental Status Exam:  Appearance:   Casual and Neat     Behavior:  Appropriate  Motor:  Normal  Speech/Language:   Normal Rate  Affect:  Appropriate  Mood:  labile  Thought process:  normal  Thought content:    WNL  Sensory/Perceptual disturbances:    WNL  Orientation:  oriented to person, place, time/date, and situation  Attention:  Good  Concentration:  Good  Memory:  Immediate;   [place] of knowledge:   Good  Insight:    Good  Judgment:   Good  Impulse Control:  Good   Risk Assessment: Danger to Self:  No Self-injurious Behavior: No Danger to Others: No Duty to Warn:no Physical Aggression / Violence:No  Access to Firearms a concern: No  Gang Involvement:No   Subjective: Patient was present for session.  She shared her vitamin D levels are still low. She shared she has noticed the fatigue.  She is going to her mother's for Thanksgiving.  Mother is still in recovery and that is stressful for her.  She shared she is noticing that she is feeling obsessive over her new kittens which she is tied to her cat's sudden death.  The PTSD group is triggering bad dreams for her. She shared she is realizing that she hasn't had support for her emotions so she just had to stuff everything.  Patient did processing set on issues with mother.  Suds level 8, negative cognition "I do not matter ", felt sadness in her chest.  Patient was able to reduce suds level to 5.  Discussed that processing would continue.  Patient was encouraged to focus on her self-care as she prepares for holiday with her family.  Reminded patient to affirm herself regularly and focus on what she can control fix and  change.  Interventions: Cognitive Behavioral Therapy, Solution-Oriented/Positive Psychology, Insight-Oriented, and BS P  Diagnosis:   ICD-10-CM   1. MDD (major depressive disorder), recurrent episode, mild ([place])  F33.0       Plan: Patient is to use CBT and coping skills to decrease mood issues.  Patient is to work on self-care especially as she prepares for the holiday season.  Patient is to continue working on setting limits with her family.  Patient is to take medication as directed. Long-term goal: Elevate mood and show evidence of usual energy activities and socialization level Short-term goal: Identify and replace depressive thinking that leads to depressive feelings and actions  [name], [facility]

## 2020-12-26 ENCOUNTER — Other Ambulatory Visit: Payer: Self-pay

## 2020-12-26 ENCOUNTER — Ambulatory Visit (INDEPENDENT_AMBULATORY_CARE_PROVIDER_SITE_OTHER): Payer: 59 | Admitting: Psychiatry

## 2020-12-26 DIAGNOSIS — F330 Major depressive disorder, recurrent, mild: Secondary | ICD-10-CM

## 2020-12-26 NOTE — Progress Notes (Signed)
      [facility] Counselor/Therapist Progress Note  Patient ID: Diana Ayers, MRN: [id number],    Date: 12/26/2020  Time Spent: 55 minutes start time 11:09 AM end time 12:04 PM  Treatment Type: Individual Therapy  Reported Symptoms: depression, anxiety, triggered responses, weight gain  Mental Status Exam:  Appearance:   Casual and Neat     Behavior:  Appropriate  Motor:  Normal  Speech/Language:   Normal Rate  Affect:  Appropriate  Mood:  anxious  Thought process:  normal  Thought content:    WNL  Sensory/Perceptual disturbances:    WNL  Orientation:  oriented to person, place, time/date, and situation  Attention:  Good  Concentration:  Good  Memory:  WNL  Fund of knowledge:   Good  Insight:    Good  Judgment:   Good  Impulse Control:  Good   Risk Assessment: Danger to Self:  No Self-injurious Behavior: No Danger to Others: No Duty to Warn:no Physical Aggression / Violence:No  Access to Firearms a concern: No  Gang Involvement:No   Subjective: Patient was present for session.  She shared that is at the end of her PTSD group time and she reported that it was a positive experience.  She shared she is trying to figure out what she will do next.  Patient shared she is working with doctor on her precancer places on her face.  She also has concerns regarding her eyes and is working with her eye doctor.  Patient explained that currently her biggest issue is trying to figure out how to deal with the relationship with her roommate.  She shared that she comes and goes and sometimes spends the night and sometimes does not at their home and if she asked her what her plans are so she does not worry roommate gets very upset.  Patient did processing set on roommate being gone, suds level 8, negative cognition "I do not matter" felt sadness in her core.  Patient was able to reduce suds level to 4.  She was able to recognize that her roommates behavior is triggering trauma from her  past with her mother because behaviors are similar.  Discussed how to talk herself through it and how to continue keeping herself grounded even if her roommate's behavior does not change.  Interventions: Cognitive Behavioral Therapy, Solution-Oriented/Positive Psychology, Eye Movement Desensitization and Reprocessing (EMDR), and Insight-Oriented  Diagnosis:   ICD-10-CM   1. MDD (major depressive disorder), recurrent episode, mild ([place])  F33.0       Plan: Patient is to use CBT and coping skills to decrease depression symptoms.  Patient is to continue to do things that release negative emotions appropriately walking.  Patient is to talk with her roommate about what is getting triggered if it feels comfortable.  Patient is to use affirmations to help manage any triggered emotions appropriately.  Patient is to take medication as directed Long-term goal: Elevate mood and show evidence of usual energy activities and socialization level Short-term goal: Identify and replace depressive thinking that leads to depressive feelings and actions  Diana Ayers, [facility]

## 2020-12-31 ENCOUNTER — Other Ambulatory Visit: Payer: Self-pay

## 2020-12-31 ENCOUNTER — Encounter: Payer: Self-pay | Admitting: Psychiatry

## 2020-12-31 ENCOUNTER — Ambulatory Visit: Payer: 59 | Admitting: Psychiatry

## 2020-12-31 DIAGNOSIS — E559 Vitamin D deficiency, unspecified: Secondary | ICD-10-CM | POA: Diagnosis not present

## 2020-12-31 DIAGNOSIS — F330 Major depressive disorder, recurrent, mild: Secondary | ICD-10-CM | POA: Diagnosis not present

## 2020-12-31 DIAGNOSIS — F4312 Post-traumatic stress disorder, chronic: Secondary | ICD-10-CM

## 2020-12-31 DIAGNOSIS — F32A Depression, unspecified: Secondary | ICD-10-CM

## 2020-12-31 MED ORDER — VENLAFAXINE HCL ER 150 MG PO CP24: 300.0000 mg | ORAL_CAPSULE | Freq: Every day | ORAL | 1 refills | Status: DC

## 2020-12-31 MED ORDER — CARBAMAZEPINE ER 300 MG PO CP12: 600.0000 mg | ORAL_CAPSULE | Freq: Every day | ORAL | 1 refills | Status: DC

## 2020-12-31 MED ORDER — VITAMIN D (ERGOCALCIFEROL) 1.25 MG (50000 UNIT) PO CAPS: 50000.0000 [IU] | ORAL_CAPSULE | ORAL | 0 refills | Status: DC

## 2020-12-31 MED ORDER — BREXPIPRAZOLE 2 MG PO TABS: 2.0000 mg | ORAL_TABLET | Freq: Every day | ORAL | 1 refills | Status: DC

## 2020-12-31 NOTE — Progress Notes (Signed)
Diana Ayers [id number] 1960-07-07 60 y.o.  Subjective:   Patient ID:  Diana Ayers is a 60 y.o. (DOB Nov 03, 1960) female.  Chief Complaint:  Chief Complaint  Patient presents with   Follow-up    Depression and anxiety    HPI Diana Ayers presents to the office today for follow-up of depression, anxiety, and insomnia. Diana Ayers reports "I think it's helped" in regards to [place]. Notices some partial improvement in mood and anxiety. Diana Ayers is take a class about PTSD that has some intense content that has brought up some PTSD s/s. Recognizes some more recent personal traumas and losses.  Reports that Diana Ayers has been unable to cry in the last 7 years since the death of her father. Reports energy has improved a slight amount.   Diana Ayers reports weight gain in the last month and reports that Diana Ayers has been eating more sweets. 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 Now working until 12:30 am with holiday hours and this has affected her sleep schedule with going to bed later and waking up at the same time. Diana Ayers reports poor concentration and jumps from one things to the next. Denies SI. Diana Ayers reports that Diana Ayers sometimes questions her purpose.    Recently requested shorter work hours. Diana Ayers reports that her satisfaction with her job has decreased. Diana Ayers reports that Diana Ayers now has less social interaction at work.   Reports Diana Ayers and her roommate have recently been "bumping heads."   Past Psychiatric Medication Trials: Klonopin- Reports that Diana Ayers was once on 6 mg total daily dose and has decreased to 3 mg. Reports Klonopin has been helpful for anxiety.  Trazodone- Effective. Has some excessive daytime somnolence. Latuda Effexor XR- Has been helpful for mood and anxiety.  Prozac Paxil Zoloft Wellbutrin Carbamazepine XR- Has been helpful for episodic agitation/irritability/aggression Depakote- Helpful  Nortriptyline   PHQ2-9    Flowsheet Row Nutrition from 05/05/2017 in [facility] Nutrition from 08/03/2014 in [facility] Nutrition from 06/16/2014 in [facility]  PHQ-2 Total Score 0 6 3        Review of Systems:  Review of Systems  Musculoskeletal:  Negative for gait problem.  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  Psychiatric/Behavioral:         Please refer to HPI   Medications: I have reviewed the patient's current medications.  Current Outpatient Medications  Medication Sig Dispense Refill   brexpiprazole (REXULTI) 2 MG TABS tablet Take 1 tablet (2 mg total) by mouth daily. 30 tablet 1   carbamazepine (CARBATROL) 300 MG 12 hr capsule Take 2 capsules (600 mg total) by mouth at bedtime. 180 capsule 1   cholecalciferol (VITAMIN D3) 25 MCG (1000 UNIT) tablet Take 2,000 Units by mouth daily.     clonazePAM (KLONOPIN) 1 MG tablet TAKE THREE TABLETS BY MOUTH EVERY NIGHT AT BEDTIME AND TAKE 1 TO 2 TABLETS AS NEEDED FOR ANXIETY *MUST LAST 30 DAYS* 120 tablet 2   diclofenac Sodium (VOLTAREN) 1 % GEL Voltaren 1 % topical gel     L-Methylfolate 15 MG TABS Take 1 tablet (15 mg total) by mouth daily. 30 tablet 5   L-Methylfolate-Algae (DEPLIN 15) 15-90.314 MG CAPS Take 15 mg by mouth daily. 30 capsule 0   loratadine (CLARITIN) 10 MG tablet Take 10 mg by mouth daily as needed.     meloxicam (MOBIC)  15 MG tablet TAKE ONE TABLET BY MOUTH DAILY 30 tablet 3   Multiple Vitamins-Minerals (MULTIVITAMIN GUMMIES ADULT PO) Take by mouth.     SUMAtriptan (IMITREX) 100 MG tablet Take 1 tablet (100 mg total) by mouth once as needed for migraine or headache. May repeat in 2 hours if headache persists or recurs. 10 tablet 1   traZODone (DESYREL) 100 MG tablet Take 2-3 tabs po QHS 90 tablet 2   venlafaxine XR (EFFEXOR XR) 150 MG 24 hr capsule Take 2 capsules (300 mg total) by mouth daily with breakfast. 180 capsule 1   vitamin C (ASCORBIC ACID) 250  MG tablet Take 250 mg by mouth daily.     Vitamin D, Ergocalciferol, (DRISDOL) 1.25 MG (50000 UNIT) CAPS capsule Take 1 capsule (50,000 Units total) by mouth once a week. 12 capsule 0   No current facility-administered medications for this visit.    Medication Side Effects: Other: Occ leg bouncing  Allergies:  Allergies  Allergen Reactions   Onion Other (See Comments)   Tape Other (See Comments)    RASH, PT ALSO STATES RASH WITH COBAN    Past Medical History:  Diagnosis Date   Anxiety    Depression    FHx: malignant neoplasm of breast in first degree relative 07/23/2011   Hyperlipidemia    PTSD (post-traumatic stress disorder)    Thyroid disease     Past Medical History, Surgical history, Social history, and Family history were reviewed and updated as appropriate.   Please see review of systems for further details on the patient's review from today.   Objective:   Physical Exam:  There were no vitals taken for this visit.  Physical Exam Constitutional:      General: Diana Ayers is not in acute distress. Musculoskeletal:        General: No deformity.  Neurological:     Mental Status: Diana Ayers is alert and oriented to person, place, and time.     Coordination: Coordination normal.  Psychiatric:        Attention and Perception: Attention and perception normal. Diana Ayers does not perceive auditory or visual hallucinations.        Mood and Affect: Mood is anxious. Mood is not depressed. Affect is not labile, blunt, angry or inappropriate.        Speech: Speech normal.        Behavior: Behavior normal.        Thought Content: Thought content normal. Thought content is not paranoid or delusional. Thought content does not include homicidal or suicidal ideation. Thought content does not include homicidal or suicidal plan.        Cognition and Memory: Cognition and memory normal.        Judgment: Judgment normal.     Comments: Insight intact    Lab Review:     Component Value Date/Time   NA  138 11/21/2019 1205   NA 140 07/20/2008 1030   K 4.3 11/21/2019 1205   K 3.9 07/20/2008 1030   CL 101 11/21/2019 1205   CL 101 07/20/2008 1030   CO2 30 11/21/2019 1205   CO2 30 07/20/2008 1030   GLUCOSE 98 11/21/2019 1205   GLUCOSE 106 07/20/2008 1030   BUN 11 11/21/2019 1205   BUN 13 07/20/2008 1030   CREATININE 0.65 11/21/2019 1205   CALCIUM 9.9 11/21/2019 1205   CALCIUM 9.2 07/20/2008 1030   PROT 7.0 11/21/2019 1205   PROT 7.5 07/20/2008 1030   ALBUMIN 4.3 07/23/2011 1427  ALBUMIN 3.7 07/20/2008 1030   AST 16 11/21/2019 1205   AST 22 07/20/2008 1030   ALT 14 11/21/2019 1205   ALT 22 07/20/2008 1030   ALKPHOS 55 07/23/2011 1427   ALKPHOS 67 07/20/2008 1030   BILITOT 0.4 11/21/2019 1205   BILITOT 0.50 07/20/2008 1030       Component Value Date/Time   WBC 5.5 11/21/2019 1205   RBC 4.58 11/21/2019 1205   HGB 14.6 11/21/2019 1205   HGB 13.3 07/23/2011 1427   HCT 42.8 11/21/2019 1205   HCT 40.8 07/23/2011 1427   PLT 210 11/21/2019 1205   PLT 196 07/23/2011 1427   MCV 93.4 11/21/2019 1205   MCV 95.0 07/23/2011 1427   MCH 31.9 11/21/2019 1205   MCHC 34.1 11/21/2019 1205   RDW 11.8 11/21/2019 1205   RDW 12.4 07/23/2011 1427   LYMPHSABS 2.3 07/23/2011 1427   MONOABS 0.5 07/23/2011 1427   EOSABS 0.2 07/23/2011 1427   EOSABS 0.2 07/20/2008 1030   BASOSABS 0.1 07/23/2011 1427    No results found for: POCLITH, LITHIUM   Lab Results  Component Value Date   CBMZ 8.4 11/21/2019     .res Assessment: Plan:    Pt seen for 30 minutes and time spent discussing potential benefits, risks, and side effects of increasing Rexulti to 2 mg po qd since Diana Ayers reports a partial response to the 1 mg dose. Pt agrees to increase Rexulti to 2 mg po qd since Diana Ayers has seen a partial response at the 1 mg dose.  Will continue Vitamin D supplementation since her Vit D levels have remained low despite previous supplementation. Continue Effexor XR 300 mg po qd for depression and anxiety.   Continue Carbamazepine 600 mg at bedtime for mood s/s.  Continue L-Methylfolate 15 mg po qd for augmentation of depression.  Continue Klonopin 1 mg 3 tabs po QHS and 1-2 tabs po prn anxiety.  Continue Trazodone 100-300 mg po QHS for insomnia.  Recommend continuing therapy with [name], [facility]  Patient advised to contact office with any questions, adverse effects, or acute worsening in signs and symptoms.   Diana Ayers was seen today for follow-up.  Diagnoses and all orders for this visit:  MDD (major depressive disorder), recurrent episode, mild (HCC) -     brexpiprazole (REXULTI) 2 MG TABS tablet; Take 1 tablet (2 mg total) by mouth daily.  Vitamin D deficiency -     Vitamin D, Ergocalciferol, (DRISDOL) 1.25 MG (50000 UNIT) CAPS capsule; Take 1 capsule (50,000 Units total) by mouth once a week.  Chronic post-traumatic stress disorder (PTSD) -     venlafaxine XR (EFFEXOR XR) 150 MG 24 hr capsule; Take 2 capsules (300 mg total) by mouth daily with breakfast.  Depression, unspecified depression type -     venlafaxine XR (EFFEXOR XR) 150 MG 24 hr capsule; Take 2 capsules (300 mg total) by mouth daily with breakfast. -     carbamazepine (CARBATROL) 300 MG 12 hr capsule; Take 2 capsules (600 mg total) by mouth at bedtime.    Please see After Visit Summary for patient specific instructions.  Future Appointments  Date Time Provider Ada  01/02/2021 11:00 AM [name], [facility] CP-CP None  02/05/2021 12:00 PM [name], [facility] CP-CP None  02/13/2021 12:00 PM [name], [facility] CP-CP None  02/20/2021 11:00 AM [name], [facility] CP-CP None  03/06/2021 11:00 AM [name], [facility] CP-CP None  03/07/2021  1:00 PM Thayer Headings, PMHNP CP-CP None  No orders of the defined types were placed in this encounter.   -------------------------------

## 2021-01-02 ENCOUNTER — Ambulatory Visit (INDEPENDENT_AMBULATORY_CARE_PROVIDER_SITE_OTHER): Payer: 59 | Admitting: Psychiatry

## 2021-01-02 DIAGNOSIS — F330 Major depressive disorder, recurrent, mild: Secondary | ICD-10-CM | POA: Diagnosis not present

## 2021-01-02 NOTE — Progress Notes (Signed)
[facility] Counselor/Therapist Progress Note  Patient ID: INTISAR CLAUDIO, MRN: [id number],    Date: 01/02/2021  Time Spent: 51 minutes start time 11:07 AM end time 12:00 PM Video 11:07 AM to 11:09 AM Telephone 11:11 AM end time 12:00 PM Virtual Visit via Telehealth Note Connected with patient by a telemedicine/telehealth application, with their informed consent, and verified patient privacy and that I am speaking with the correct person using two identifiers. I discussed the limitations, risks, security and privacy concerns of performing psychotherapy and the availability of in person appointments. I also discussed with the patient that there may be a patient responsible charge related to this service. The patient expressed understanding and agreed to proceed. I discussed the treatment planning with the patient. The patient was provided an opportunity to ask questions and all were answered. The patient agreed with the plan and demonstrated an understanding of the instructions. The patient was advised to call  our office if  symptoms worsen or feel they are in a crisis state and need immediate contact.   Therapist Location: office Patient Location: home    Treatment Type: Individual Therapy  Reported Symptoms: triggered responses, anxiety, panic, sadness  Mental Status Exam:  Appearance:   Casual     Behavior:  na  Motor:  na  Speech/Language:   Normal Rate  Affect:  NA  Mood:  normal  Thought process:  normal  Thought content:    WNL  Sensory/Perceptual disturbances:    WNL  Orientation:  oriented to person, place, time/date, and situation  Attention:  Good  Concentration:  Good  Memory:  WNL  Fund of knowledge:   Good  Insight:    Good  Judgment:   Good  Impulse Control:  Good   Risk Assessment: Danger to Self:  No Self-injurious Behavior: No Danger to Others: No Duty to Warn:no Physical Aggression / Violence:No  Access to Firearms a concern: No  Gang  Involvement:No   Subjective: Met with patient via phone.  Tried multiple times to do a video session but it would do picture or sound but not both so had to do phone session.  She shared she could not come in for session due to an exposure.  She went on to share that her PTSD group reboot finished and she had to share her story and hear everyone else's story as well.  She reported that it was very triggering for her. Patient shared she was able to get through things and has been distracted by her kittens who she is enjoying. She went on to share she has been trying to recognize her emotions and not let them run her life.  She was able to discuss CBT skills she can use to help herself manage her emotions appropriately.  Encouraged patient to continue to find ways to release negative emotions appropriately and to recognize when her body gets triggered and take some time to figure out the emotions and what she needs to tell herself to talk herself through what surfacing.  Interventions: Cognitive Behavioral Therapy and Solution-Oriented/Positive Psychology  Diagnosis:   ICD-10-CM   1. MDD (major depressive disorder), recurrent episode, mild ([place])  F33.0       Plan: Patient is to use CBT and coping skill to decrease depression symptoms.  Patient is to follow plans from session to set limits at work and enjoy the time with her kittens.  Patient is to find positive things to do over the Christmas break  with her sister and her mom.  Patient is to continue finding ways to work and exercise to release negative emotions appropriately.  Patient is to take medication as directed Long-term goal: Elevate mood and show evidence of usual energy activities and socialization level Short-term goal: Identify and replace depressive thinking that leads to depressive feelings and actions  [name], [facility]

## 2021-01-16 ENCOUNTER — Telehealth: Payer: Self-pay | Admitting: Psychiatry

## 2021-01-16 NOTE — Telephone Encounter (Signed)
It's approved effective 01/16/2021-01/16/2022 with [employer] ID # [id number] Rexulti 2 mg

## 2021-01-16 NOTE — Telephone Encounter (Signed)
Pt called checking status on PA for Rexulti    2 mg  1/d. May need more samples

## 2021-02-05 ENCOUNTER — Ambulatory Visit (INDEPENDENT_AMBULATORY_CARE_PROVIDER_SITE_OTHER): Payer: 59 | Admitting: Psychiatry

## 2021-02-05 DIAGNOSIS — F3341 Major depressive disorder, recurrent, in partial remission: Secondary | ICD-10-CM | POA: Diagnosis not present

## 2021-02-05 NOTE — Progress Notes (Signed)
°    [facility] Counselor/Therapist Progress Note ° °Patient ID: Diana Ayers, MRN: [id number],   ° °Date: 02/05/2021 ° °Time Spent: 50 minutes start 12:07 PM end time 12:57 PM °Virtual Visit via Video Note °Connected with patient by a telemedicine/telehealth application, with their informed consent, and verified patient privacy and that I am speaking with the correct person using two identifiers. I discussed the limitations, risks, security and privacy concerns of performing psychotherapy and the availability of in person appointments. I also discussed with the patient that there may be a patient responsible charge related to this service. The patient expressed understanding and agreed to proceed. °I discussed the treatment planning with the patient. The patient was provided an opportunity to ask questions and all were answered. The patient agreed with the plan and demonstrated an understanding of the instructions. The patient was advised to call  our office if  symptoms worsen or feel they are in a crisis state and need immediate contact. °  °Therapist Location: home °Patient Location: home °  ° °Treatment Type: Individual Therapy ° °Reported Symptoms: anxiety, grief issues, triggered responses, sadness, sleep issues ° °Mental Status Exam: ° °Appearance:   Well Groomed     °Behavior:  Appropriate  °Motor:  Normal  °Speech/Language:   Normal Rate  °Affect:  Appropriate  °Mood:  normal  °Thought process:  normal  °Thought content:    WNL  °Sensory/Perceptual disturbances:    WNL  °Orientation:  oriented to person, place, time/date, and situation  °Attention:  Good  °Concentration:  Good  °Memory:  Immediate;   Fair  °Fund of knowledge:   Good  °Insight:    Good  °Judgment:   Good  °Impulse Control:  Good  ° °Risk Assessment: °Danger to Self:  No °Self-injurious Behavior: No °Danger to Others: No °Duty to Warn:no °Physical Aggression / Violence:No  °Access to Firearms a concern: No  °Gang Involvement:No   ° °Subjective: Met with patient via virtual session.  Patient reported that her mood is much better.  She shared she decided to end her job and is starting to work more with her trauma support group [facility]. She shared that January is still a hard month for her due to that being the month that her father passed and it was the month that she witnessed his seizure and his decline.  Patient was able to acknowledge that even though she was getting triggered she was managing things appropriately.  Went on to explain that she is feeling things are moving in a more positive direction and being a part of the support group has given her more of a purpose than work did.  Patient stated that she is managing things appropriately with family.  She did have 1 issue she discussed and was encouraged to focus on what she can control fix and change in the situation.  She was also able to acknowledge that stopping work is giving her an opportunity to go on trips with her roommate which has helped things as well.  Patient acknowledged that exercise is one thing she needs to start focusing on.  Discussed the importance of self-care especially as she is preparing to engage more work with her trauma support group.  Patient also was encouraged to recognize that might help with her sleep issues as well. ° ° ° °Treatment: Cognitive Behavioral Therapy and Solution-Oriented/Positive Psychology ° °Diagnosis: °  ICD-10-CM   °1. MDD (major depressive disorder), recurrent, in partial remission (HCC)  F33.41   °  ° ° °  F33.41       Plan: Patient is to use CBT and coping skills to decrease depression symptoms.  Patient is to continue working with her reboot trauma support group.  Patient is to start walking regularly to release negative emotions appropriately.  Patient is to take medication as directed. Long-term goal: Elevate mood and show evidence of usual energy activities and socialization level Short-term goal: Identify and replace depressive thinking that  leads to depressive feelings and actions  [name], [facility]

## 2021-02-13 ENCOUNTER — Ambulatory Visit (INDEPENDENT_AMBULATORY_CARE_PROVIDER_SITE_OTHER): Payer: 59 | Admitting: Psychiatry

## 2021-02-13 ENCOUNTER — Telehealth: Payer: Self-pay | Admitting: Psychiatry

## 2021-02-13 ENCOUNTER — Other Ambulatory Visit: Payer: Self-pay

## 2021-02-13 DIAGNOSIS — F3341 Major depressive disorder, recurrent, in partial remission: Secondary | ICD-10-CM | POA: Diagnosis not present

## 2021-02-13 NOTE — Telephone Encounter (Signed)
Pt called and said that she is on a lot of sleep medications. However she is only sleeping about 5 hrs a night. So she is wondering if she can take a melatonin around 4 am or 5 am when she wakes up to help her go back to sleep. Please give her a call at [phone]

## 2021-02-13 NOTE — Telephone Encounter (Signed)
Tried to get more info but phone going to VM.Please review

## 2021-02-13 NOTE — Progress Notes (Signed)
°      [facility] Counselor/Therapist Progress Note  Patient ID: Diana Ayers, MRN: [id number],    Date: 02/13/2021  Time Spent: 51 minutes start time 12:09 PM end time 1 PM  Treatment Type: Individual Therapy  Reported Symptoms: anxiety, sleep issues, sadness, grief issues  Mental Status Exam:  Appearance:   Well Groomed     Behavior:  Appropriate  Motor:  Normal  Speech/Language:   Normal Rate  Affect:  Appropriate  Mood:  normal  Thought process:  normal  Thought content:    WNL  Sensory/Perceptual disturbances:    WNL  Orientation:  oriented to person, place, time/date, and situation  Attention:  Good  Concentration:  Good  Memory:  WNL  Fund of knowledge:   Good  Insight:    Good  Judgment:   Good  Impulse Control:  Good   Risk Assessment: Danger to Self:  No Self-injurious Behavior: No Danger to Others: No Duty to Warn:no Physical Aggression / Violence:No  Access to Firearms a concern: No  Gang Involvement:No   Subjective: Patient was present for session. She shared she is working on her appearance to try and feel more confident. 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 She is still struggling this month due to the grief surrounding her dad's seizures and death that quickly followed.  Patient was allowed time to discuss some of the things that were surfacing for her.  Encouraged patient to write her dad a letter letting him know what has happened over the last several years and take it to her when she goes on the beach this weekend to release it in the water.  Patient reported feeling positive about that plan and agreed to follow through with that.  She was encouraged to make sure she is exercising regularly discussed how not releasing enough from her body can create issues with her sleep.  Patient acknowledged since she is no longer working she has not been as active and that getting back to a regular routine probably  will help her physically.  Patient was also able to discuss ways that she can engage more in the group she is leading and through that being able to stay more positive and active to help continue to improve her mood.  Interventions: Cognitive Behavioral Therapy, Solution-Oriented/Positive Psychology, and Insight-Oriented  Diagnosis:   ICD-10-CM   1. MDD (major depressive disorder), recurrent, in partial remission ([place])  F33.41       Plan: Patient is to use CBT and coping skills to decrease depression symptoms.  Patient is to continue participating in the reboot program.  Patient is to exercise to release negative emotions appropriately.  Patient is to write her father a letter and put it in the ocean.  Patient is to take medication as directed. Long-term goal: Elevate mood and show evidence of usual energy activities and socialization level Short-term goal: Identify and replace depressive thinking that leads to depressive feelings and actions  [name], [facility]

## 2021-02-13 NOTE — Telephone Encounter (Signed)
Would not recommend taking a Melatonin that late because it then may disrupt her circadian rhythm. If she wants to try Melatonin, I would recommend taking it at bedtime. If staying asleep is the main issue, she may want to try an extended release Melatonin. We can discuss other options for insomnia if her sleep does not improve at her next apt.

## 2021-02-14 NOTE — Telephone Encounter (Signed)
Pt informed

## 2021-02-20 ENCOUNTER — Ambulatory Visit: Payer: 59 | Admitting: Psychiatry

## 2021-03-06 ENCOUNTER — Ambulatory Visit (INDEPENDENT_AMBULATORY_CARE_PROVIDER_SITE_OTHER): Payer: 59 | Admitting: Psychiatry

## 2021-03-06 ENCOUNTER — Other Ambulatory Visit: Payer: Self-pay

## 2021-03-06 DIAGNOSIS — F3341 Major depressive disorder, recurrent, in partial remission: Secondary | ICD-10-CM | POA: Diagnosis not present

## 2021-03-06 NOTE — Progress Notes (Signed)
°      [facility] Counselor/Therapist Progress Note  Patient ID: KALIOPI BLYDEN, MRN: [id number],    Date: 03/06/2021  Time Spent: 50 minutes start time 11:11 AM end time 12:01 PM  Treatment Type: Individual Therapy  Reported Symptoms: anxiety, triggered responses, sadness  Mental Status Exam:  Appearance:   Casual and Neat     Behavior:  Appropriate  Motor:  Normal  Speech/Language:   Normal Rate  Affect:  Appropriate  Mood:  anxious  Thought process:  normal  Thought content:    WNL  Sensory/Perceptual disturbances:    WNL  Orientation:  oriented to person, place, time/date, and situation  Attention:  Good  Concentration:  Good  Memory:  WNL  Fund of knowledge:   Good  Insight:    Good  Judgment:   Good  Impulse Control:  Good   Risk Assessment: Danger to Self:  No Self-injurious Behavior: No Danger to Others: No Duty to Warn:no Physical Aggression / Violence:No  Access to Firearms a concern: No  Gang Involvement:No   Subjective: Patient was present for session.  She shared she was feeling stress getting  participants for her Reboot group. She took her mother on a trip and she shared she is having sadness over her mother's decline. She shared she is waking up earlier in the mornings.  She acknowledged she has to walk more regularly and is planning on it. She has decided it is time to take on some projects at her house which is keeping her brain engaged in positive.  Patient went on to share she is feeling things are moving in a positive direction.  She has reconnected with some friends at church and that has been good.  She did share that she is wanting more interactions and not sure how to engage people that are married and different activities.  Discussed things that she could do and the importance of just feeling comfortable putting the question out there and seeing where it goes from there.  Patient was also encouraged to get back into a regular routine of exercise  since that seems to help her mood when she is in a routine.  Interventions: Cognitive Behavioral Therapy and Solution-Oriented/Positive Psychology  Diagnosis:   ICD-10-CM   1. MDD (major depressive disorder), recurrent, in partial remission ([place])  F33.41       Plan: Patient is to use CBT and coping skills to decrease depression symptoms.  Patient is to continue participating in the reboot program.  Patient is to participate in her church Bible study and work on developing more relationships outside of the group time.  Patient is to exercise to release negative emotions appropriately.  Patient is to take medication as directed. Long-term goal: Elevate mood and show evidence of usual energy activities and socialization level Short-term goal: Identify and replace depressive thinking that leads to depressive feelings and actions    [name], [facility]

## 2021-03-07 ENCOUNTER — Ambulatory Visit: Payer: 59 | Admitting: Psychiatry

## 2021-03-07 ENCOUNTER — Encounter: Payer: Self-pay | Admitting: Psychiatry

## 2021-03-07 DIAGNOSIS — F32A Depression, unspecified: Secondary | ICD-10-CM | POA: Diagnosis not present

## 2021-03-07 DIAGNOSIS — F4312 Post-traumatic stress disorder, chronic: Secondary | ICD-10-CM | POA: Diagnosis not present

## 2021-03-07 DIAGNOSIS — E559 Vitamin D deficiency, unspecified: Secondary | ICD-10-CM | POA: Diagnosis not present

## 2021-03-07 DIAGNOSIS — F330 Major depressive disorder, recurrent, mild: Secondary | ICD-10-CM

## 2021-03-07 MED ORDER — BREXPIPRAZOLE 2 MG PO TABS: 2.0000 mg | ORAL_TABLET | Freq: Every day | ORAL | 2 refills | Status: DC

## 2021-03-07 MED ORDER — CLONAZEPAM 1 MG PO TABS: ORAL_TABLET | ORAL | 2 refills | Status: DC

## 2021-03-07 MED ORDER — TRAZODONE HCL 100 MG PO TABS: ORAL_TABLET | ORAL | 2 refills | Status: DC

## 2021-03-07 MED ORDER — VITAMIN D (ERGOCALCIFEROL) 1.25 MG (50000 UNIT) PO CAPS: 50000.0000 [IU] | ORAL_CAPSULE | ORAL | 1 refills | Status: DC

## 2021-03-07 MED ORDER — CARBAMAZEPINE ER 300 MG PO CP12: 600.0000 mg | ORAL_CAPSULE | Freq: Every day | ORAL | 0 refills | Status: DC

## 2021-03-07 NOTE — Progress Notes (Signed)
Diana Ayers [id number] 28-Aug-1960 61 y.o.  Subjective:   Patient ID:  Diana Ayers is a 61 y.o. (DOB 1960-04-08) female.  Chief Complaint:  Chief Complaint  Patient presents with   Follow-up    Anxiety, depression, and insomnia    HPI Diana Ayers presents to the office today for follow-up of anxiety, depression, and insomnia. She reports that increase in [place] "worked." She reports that her mood is "on the upswing." She describes feeling more hopeful and having a purpose. She reports that she has had anxiety with starting new group. She also has some anxiety about remodeling her kitchen. She reports that she has been sleeping about 5-6 hours a night regardless of when she goes to bed and this is a short duration compared to the past. Waking up at 5:15-6 am. She reports that energy and motivation have been ok. She reports that she has some difficulty with concentration- "I get off track a lot." She reports that her roommate has commented that she "never finishes anything." She reports that she will start one project and then go to another project before completing it. 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 She reports that she hash gained weight and has been eating more sweets. Denies SI.   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 She reports 15-16 traumatic events that have contributed to her PTSD.   She reports that she quit her job at [employer] and "this was a good thing." She has taken some mini-vacations. Has been participating in a Bible study.   Has been enjoying 2 kittens. Kittens have been very affectionate.   Mother has been at a better place in terms of her health. She has not been needing to travel back and forth to see her mother. Some sadness in response to observing  aging process in her mother.   Past Psychiatric Medication Trials: Klonopin- Reports that she was once on 6 mg total daily dose and has decreased to 3 mg. Reports Klonopin has been helpful for anxiety.  Trazodone- Effective. Has some excessive daytime somnolence. Latuda Effexor XR- Has been helpful for mood and anxiety.  Prozac Paxil Zoloft Wellbutrin Carbamazepine XR- Has been helpful for episodic agitation/irritability/aggression Depakote- Helpful  Nortriptyline   AIMS    Flowsheet Row Office Visit from 03/07/2021 in [place] Total Score 0      PHQ2-9    Flowsheet Row [facility] Nutrition [facility] 08/03/2014 [facility] Nutrition from 06/16/2014 in [facility]  PHQ-2 Total Score 0 6 3        Review of Systems:  Review of Systems  Musculoskeletal:  Negative for gait problem.  Neurological:  Negative for tremors.  Psychiatric/Behavioral:         Please refer to HPI   Medications: I have reviewed the patient's current medications.  Current Outpatient Medications  Medication Sig Dispense Refill   brexpiprazole (REXULTI) 2 MG TABS tablet Take 1 tablet (2 mg total) by mouth daily. 30 tablet 2   carbamazepine (CARBATROL) 300 MG 12 hr capsule Take 2 capsules (600 mg total) by mouth at bedtime. 180 capsule 0   cholecalciferol (VITAMIN D3) 25 MCG (1000 UNIT) tablet Take 2,000 Units by mouth daily.     [  START ON 04/03/2021] clonazePAM (KLONOPIN) 1 MG tablet TAKE THREE TABLETS BY MOUTH EVERY NIGHT AT BEDTIME AND TAKE 1 TO 2 TABLETS AS NEEDED FOR ANXIETY *MUST LAST 30 DAYS* 120 tablet 2   diclofenac Sodium (VOLTAREN) 1 % GEL Voltaren 1 % topical gel     L-Methylfolate 15 MG TABS Take 1 tablet (15 mg total) by mouth daily. 30 tablet 5   L-Methylfolate-Algae (DEPLIN 15) 15-90.314 MG CAPS Take 15 mg by mouth daily. 30 capsule 0   loratadine  (CLARITIN) 10 MG tablet Take 10 mg by mouth daily as needed.     meloxicam (MOBIC) 15 MG tablet TAKE ONE TABLET BY MOUTH DAILY 30 tablet 3   Multiple Vitamins-Minerals (MULTIVITAMIN GUMMIES ADULT PO) Take by mouth.     SUMAtriptan (IMITREX) 100 MG tablet Take 1 tablet (100 mg total) by mouth once as needed for migraine or headache. May repeat in 2 hours if headache persists or recurs. 10 tablet 1   traZODone (DESYREL) 100 MG tablet Take 2-3 tabs po QHS 90 tablet 2   venlafaxine XR (EFFEXOR XR) 150 MG 24 hr capsule Take 2 capsules (300 mg total) by mouth daily with breakfast. 180 capsule 1   vitamin C (ASCORBIC ACID) 250 MG tablet Take 250 mg by mouth daily.     Vitamin D, Ergocalciferol, (DRISDOL) 1.25 MG (50000 UNIT) CAPS capsule Take 1 capsule (50,000 Units total) by mouth once a week. 12 capsule 1   No current facility-administered medications for this visit.    Medication Side Effects: None  Allergies:  Allergies  Allergen Reactions   Onion Other (See Comments)   Tape Other (See Comments)    RASH, PT ALSO STATES RASH WITH COBAN    Past Medical History:  Diagnosis Date   Anxiety    Depression    FHx: malignant neoplasm of breast in first degree relative 07/23/2011   Hyperlipidemia    PTSD (post-traumatic stress disorder)    Thyroid disease     Past Medical History, Surgical history, Social history, and Family history were reviewed and updated as appropriate.   Please see review of systems for further details on the patient's review from today.   Objective:   Physical Exam:  There were no vitals taken for this visit.  Physical Exam Constitutional:      General: She is not in acute distress. Musculoskeletal:        General: No deformity.  Neurological:     Mental Status: She is alert and oriented to person, place, and time.     Coordination: Coordination normal.  Psychiatric:        Attention and Perception: Attention and perception normal. She does not perceive  auditory or visual hallucinations.        Mood and Affect: Mood normal. Mood is not anxious or depressed. Affect is not labile, blunt, angry or inappropriate.        Speech: Speech normal.        Behavior: Behavior normal.        Thought Content: Thought content normal. Thought content is not paranoid or delusional. Thought content does not include homicidal or suicidal ideation. Thought content does not include homicidal or suicidal plan.        Cognition and Memory: Cognition and memory normal.        Judgment: Judgment normal.     Comments: Insight intact    Lab Review:     Component Value Date/Time   NA 138 11/21/2019 1205  NA 140 07/20/2008 1030   K 4.3 11/21/2019 1205   K 3.9 07/20/2008 1030   CL 101 11/21/2019 1205   CL 101 07/20/2008 1030   CO2 30 11/21/2019 1205   CO2 30 07/20/2008 1030   GLUCOSE 98 11/21/2019 1205   GLUCOSE 106 07/20/2008 1030   BUN 11 11/21/2019 1205   BUN 13 07/20/2008 1030   CREATININE 0.65 11/21/2019 1205   CALCIUM 9.9 11/21/2019 1205   CALCIUM 9.2 07/20/2008 1030   PROT 7.0 11/21/2019 1205   PROT 7.5 07/20/2008 1030   ALBUMIN 4.3 07/23/2011 1427   ALBUMIN 3.7 07/20/2008 1030   AST 16 11/21/2019 1205   AST 22 07/20/2008 1030   ALT 14 11/21/2019 1205   ALT 22 07/20/2008 1030   ALKPHOS 55 07/23/2011 1427   ALKPHOS 67 07/20/2008 1030   BILITOT 0.4 11/21/2019 1205   BILITOT 0.50 07/20/2008 1030       Component Value Date/Time   WBC 5.5 11/21/2019 1205   RBC 4.58 11/21/2019 1205   HGB 14.6 11/21/2019 1205   HGB 13.3 07/23/2011 1427   HCT 42.8 11/21/2019 1205   HCT 40.8 07/23/2011 1427   PLT 210 11/21/2019 1205   PLT 196 07/23/2011 1427   MCV 93.4 11/21/2019 1205   MCV 95.0 07/23/2011 1427   MCH 31.9 11/21/2019 1205   MCHC 34.1 11/21/2019 1205   RDW 11.8 11/21/2019 1205   RDW 12.4 07/23/2011 1427   LYMPHSABS 2.3 07/23/2011 1427   MONOABS 0.5 07/23/2011 1427   EOSABS 0.2 07/23/2011 1427   EOSABS 0.2 07/20/2008 1030   BASOSABS 0.1  07/23/2011 1427    No results found for: POCLITH, LITHIUM   Lab Results  Component Value Date   CBMZ 8.4 11/21/2019     .res Assessment: Plan:   Pt seen for 30 minutes and time spent discussing lab monitoring for adverse effects with Carbamazepine. Discussed that she may also wish to obtain routine labs with annual wellness visit since it appears it has been about 2 years since she had routine lab work. She reports she will make an apt with PCP.  She reports that she would like to continue current medications without changes and notices some benefits with increase in [place].  Continue Rexulti 2 mg po qd for mood s/s.  Continue Carbamazepine 600 mg at bedtime for mood and anxiety.  Continue Klonopin 1 mg three tabs at bedtime and and 1-2 tabs as needed for anxiety.  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  Continue Effexor XR 300 mg po qd for depression and anxiety.  Continue Trazodone 100 mg 2-3 tabs po QHS for insomnia. Recommend continuing therapy with [name], [facility].  Pt to follow-up in 3 months or sooner if clinically indicated.  Patient advised to contact office with any questions, adverse effects, or acute worsening in signs and symptoms.   Diana Ayers was seen today for follow-up.  Diagnoses and all orders for this visit:  Chronic post-traumatic stress disorder (PTSD) -     clonazePAM (KLONOPIN) 1 MG tablet; TAKE THREE TABLETS BY MOUTH EVERY NIGHT AT BEDTIME AND TAKE 1 TO 2 TABLETS AS NEEDED FOR ANXIETY *MUST LAST 30 DAYS* -     traZODone (DESYREL) 100 MG tablet; Take 2-3 tabs po QHS  Vitamin D deficiency -     Vitamin D, Ergocalciferol, (DRISDOL) 1.25 MG (50000 UNIT) CAPS capsule; Take 1 capsule (50,000 Units total) by mouth once a week.  MDD (major depressive disorder), recurrent  episode, mild ([facility]) -     brexpiprazole (REXULTI) 2 MG TABS tablet; Take 1 tablet (2 mg total) by mouth daily.  Depression, unspecified depression type -      carbamazepine (CARBATROL) 300 MG 12 hr capsule; Take 2 capsules (600 mg total) by mouth at bedtime.     Please see After Visit Summary for patient specific instructions.  Future Appointments  Date Time Provider [place]  04/09/2021  1:00 PM [name], [facility] CP-CP None  04/22/2021  1:00 PM [name], [facility] CP-CP None  05/08/2021 11:00 AM [name], [facility] CP-CP None  05/22/2021 11:00 AM [name], [facility] CP-CP None  06/05/2021 11:00 AM [name], [facility] CP-CP None  06/06/2021  1:00 PM Thayer Headings, [facility] CP-CP None  06/19/2021 11:00 AM [name], [facility] CP-CP None    No orders of the defined types were placed in this encounter.   -------------------------------

## 2021-03-26 ENCOUNTER — Other Ambulatory Visit: Payer: Self-pay | Admitting: Podiatry

## 2021-04-04 ENCOUNTER — Telehealth: Payer: Self-pay | Admitting: Psychiatry

## 2021-04-04 NOTE — Telephone Encounter (Signed)
Patient notified. An order was sent May 2022, good for one year.  ?

## 2021-04-04 NOTE — Telephone Encounter (Signed)
Pt called at 9:30 am and asked for a lab for vitamin d 3 level to [facility]. She said it must say on the lab that it is due to vitamin d deficiency. . Her next appt is in may ?

## 2021-04-09 ENCOUNTER — Other Ambulatory Visit: Payer: Self-pay

## 2021-04-09 ENCOUNTER — Ambulatory Visit: Payer: 59 | Admitting: Psychiatry

## 2021-04-09 DIAGNOSIS — F4312 Post-traumatic stress disorder, chronic: Secondary | ICD-10-CM | POA: Diagnosis not present

## 2021-04-09 NOTE — Progress Notes (Signed)
?      [facility] Counselor/Therapist Progress Note ? ?Patient ID: Diana Ayers, MRN: [id number],   ? ?Date: 04/09/2021 ? ?Time Spent: 57 minutes start time 1:09 PM end time 2:06 PM ? ?Treatment Type: Individual Therapy ? ?Reported Symptoms: health issues, fatigue, triggered responses, sadness, nightmares, sleep issues ? ?Mental Status Exam: ? ?Appearance:   Casual     ?Behavior:  Appropriate  ?Motor:  Normal  ?Speech/Language:   Normal Rate  ?Affect:  Appropriate  ?Mood:  normal  ?Thought process:  normal  ?Thought content:    WNL  ?Sensory/Perceptual disturbances:    WNL  ?Orientation:  oriented to person, place, time/date, and situation  ?Attention:  Good  ?Concentration:  Good  ?Memory:  Immediate;   Fair  ?Fund of knowledge:   Good  ?Insight:    Good  ?Judgment:   Good  ?Impulse Control:  Good  ? ?Risk Assessment: ?Danger to Self:  No ?Self-injurious Behavior: No ?Danger to Others: No ?Duty to Warn:no ?Physical Aggression / Violence:No  ?Access to Firearms a concern: No  ?Gang Involvement:No  ? ?Subjective: Patient was present for session. She shared she has been sick but is getting better. She went on to share that no one showed up for her group Reboot which was hard.  She went on to report she is realizing she needs another leader to be able to do the group.  Patient also acknowledged she is wanting to engage more types of trauma in the group.  She went on to share that she is had different interactions with others that have had traumas as she is discussed the group and that has triggered negative memories for her at the same time helped her do some healing.  Patient was encouraged to focus on her progress and the healing that has occurred with her PTSD.  She was reminded of the importance of releasing the emotions from her body.  She shared she will be working on remodeling her kitchen and that will be a positive release for lots of the emotions.  Patient shared she is also had triggers with just  going home and recognizes that is increasing irritability for her.  Discussed different ways to manage those emotions and as well as how to prepare for when she needs to go home to help care for her mother.  Patient was encouraged to get back to regular self-care and focusing on the positives and the growth that she is made. ? ?Interventions: Cognitive Behavioral Therapy and Solution-Oriented/Positive Psychology ? ?Diagnosis: ?  ICD-10-CM   ?1. Chronic post-traumatic stress disorder (PTSD)  F43.12   ?  ? ? ?Plan:  Patient is to use CBT and coping skills to decrease depression symptoms.  Patient is to continue participating in the reboot program.  Patient is to participate in her church Bible study and work on developing more relationships outside of the group time.  Patient is to exercise to release negative emotions appropriately.  Patient is to follow plans from session to help as she goes to the home she grew up in to manage triggers appropriately.  Patient is to take medication as directed. ?Long term goal: Develop and implement effective coping skills to carry out normal responsibilities and participate constructively in relationships. ?Short term goal:Practice implement relaxation training as a coping mechanism for tension, panic, stress, anger, and anxiety ? ?[name], [facility] ? ? ? ? ? ? ? ? ? ? ? ? ? ? ? ? ? ? ?

## 2021-04-22 ENCOUNTER — Ambulatory Visit: Payer: 59 | Admitting: Psychiatry

## 2021-04-22 DIAGNOSIS — F4312 Post-traumatic stress disorder, chronic: Secondary | ICD-10-CM

## 2021-04-22 NOTE — Progress Notes (Signed)
?      [facility] Counselor/Therapist Progress Note ? ?Patient ID: Diana Ayers, MRN: [id number],   ? ?Date: 04/22/2021 ? ?Time Spent: 56 minutes start time 1:06 PM end time 2:02 PM ? ?Treatment Type: Individual Therapy ? ?Reported Symptoms: anxiety, triggered responses, sadness ? ?Mental Status Exam: ? ?Appearance:   Casual and Neat     ?Behavior:  Appropriate  ?Motor:  Normal  ?Speech/Language:   Normal Rate  ?Affect:  Appropriate  ?Mood:  normal  ?Thought process:  normal  ?Thought content:    WNL  ?Sensory/Perceptual disturbances:    WNL  ?Orientation:  oriented to person, place, time/date, and situation  ?Attention:  Good  ?Concentration:  Good  ?Memory:  WNL  ?Fund of knowledge:   Good  ?Insight:    Good  ?Judgment:   Good  ?Impulse Control:  Good  ? ?Risk Assessment: ?Danger to Self:  No ?Self-injurious Behavior: No ?Danger to Others: No ?Duty to Warn:no ?Physical Aggression / Violence:No  ?Access to Firearms a concern: No  ?Gang Involvement:No  ? ?Subjective: Patient was present for session. She shared that she felt she has been doing better. She shared that she is still trying to figure out what she is going to do with her trauma group.  Patient went on to share that her mother come up for a few days and she was able to have a positive time even though she did get triggered.  Patient was able to share that she is keeping her brain engaged in very concrete activity with the house remodel and that seems to be helping her manage any flashbacks or memories in an appropriate manner.  Patient discussed situations with her roommate and different strategies to help her manage those situations were addressed in session.  Patient was able to do share her difficulty with thinking through the what if some worst possible scenarios and different situations.  Discussed how that may be normal with her career option but also difficult for other people and herself.  Discussed the importance of thinking through  positives as well as negatives and when she thinks of the what if's of the negatives to challenge herself to think of the what-ifs of the positives as well.  Was also encouraged to continue engaging in her activities that she finds to be enjoyable and a good release for negative emotions including fishing. ? ?Interventions: Cognitive Behavioral Therapy and Solution-Oriented/Positive Psychology ? ?Diagnosis: ?  ICD-10-CM   ?1. Chronic post-traumatic stress disorder (PTSD)  F43.12   ?  ? ? ?Plan:  Patient is to use CBT and coping skills to decrease depression symptoms.  Patient is to work on recognizing when she gets in very negative thinking and trying to change it to sharing some positive things as well..  Patient is to participate in her church Bible study and work on developing more relationships outside of the group time.  Patient is to exercise to release negative emotions appropriately.  Patient is to follow plans from session to help as she goes to the home she grew up in to manage triggers appropriately.  Patient is to take medication as directed. ?Long term goal: Develop and implement effective coping skills to carry out normal responsibilities and participate constructively in relationships. ?Short term goal:Practice implement relaxation training as a coping mechanism for tension, panic, stress, anger, and anxiety ? ?[name], [facility] ? ? ? ? ? ? ? ? ? ? ? ? ? ? ? ? ? ? ?

## 2021-05-08 ENCOUNTER — Ambulatory Visit: Payer: 59 | Admitting: Psychiatry

## 2021-05-09 ENCOUNTER — Telehealth: Payer: Self-pay | Admitting: Psychiatry

## 2021-05-09 NOTE — Telephone Encounter (Signed)
Pt called at 10:45 am . She has been on sleeping meds for a while now. She is awakening early like 4 or 5 am . She doesn't go to sleep until midnight. Her question is can she take melatonin along with her sleep meds . She doesn't want to increase her sleep meds, Please give her a call at [phone] ?

## 2021-05-10 NOTE — Telephone Encounter (Signed)
Please let her know it is ok to try Melatonin with her current meds.  ?

## 2021-05-10 NOTE — Telephone Encounter (Signed)
LVM to RC 

## 2021-05-10 NOTE — Telephone Encounter (Signed)
Patient said she is taking 2.5 tabs of Klonopin and 150 mg of trazodone at night for sleep. She said she is not sleeping thru the night, averaging 5 hours of sleep. She doesn't want to increase her trazodone because she said she feels hungover and it is 12-1 PM before her head clears. She said this regimen worked for years, but no longer does. She said a nurse friend asked if she had tried melatonin and she is wondering if that may be helpful.  ?

## 2021-05-13 NOTE — Telephone Encounter (Signed)
Patient notified

## 2021-05-22 ENCOUNTER — Ambulatory Visit: Payer: 59 | Admitting: Psychiatry

## 2021-05-22 DIAGNOSIS — F3341 Major depressive disorder, recurrent, in partial remission: Secondary | ICD-10-CM

## 2021-05-22 NOTE — Progress Notes (Signed)
?      [facility] Counselor/Therapist Progress Note ? ?Patient ID: Diana Ayers, MRN: [id number],   ? ?Date: 05/22/2021 ? ?Time Spent: 51 minutes start time 10:09 AM end time 11 AM ? ?Treatment Type: Individual Therapy ? ?Reported Symptoms: anxiety, sadness, overwhelmed, focusing issues, sleep issues, triggered responses, motivation issues ? ?Mental Status Exam: ? ?Appearance:   Well Groomed     ?Behavior:  Appropriate  ?Motor:  Normal  ?Speech/Language:   Normal Rate  ?Affect:  Appropriate  ?Mood:  anxious  ?Thought process:  normal  ?Thought content:    WNL  ?Sensory/Perceptual disturbances:    WNL  ?Orientation:  oriented to person, place, time/date, and situation  ?Attention:  Good  ?Concentration:  Good  ?Memory:  WNL  ?Fund of knowledge:   Good  ?Insight:    Good  ?Judgment:   Good  ?Impulse Control:  Good  ? ?Risk Assessment: ?Danger to Self:  No ?Self-injurious Behavior: No ?Danger to Others: No ?Duty to Warn:no ?Physical Aggression / Violence:No  ?Access to Firearms a concern: No  ?Gang Involvement:No  ? ?Subjective: Patient was present for session.  She shared she is having lots of anxiety over all the house renovations she is having to do.  She reported she is getting through it, encouraged her to feel positive about the way she is handling things even though it is difficult.  Patient shared she is concerned about her weight, discussed the importance of eating the right things for her body and brain.  She agreed to work on her eating and continue trying to improve her exercising. 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  Encouraged her to get into a habit of the studying to help her break things down and not feel so overwhelmed.  Also encouraged her to get back to working on some of the Dr. Tawanna Solo neuro cycle information to work on changing  the negative cognitions.  Patient was encouraged to do some grounding exercises at night so that she sleeps better.  Wrote out all the things discussed in session on an index card for her to take and work on at home. ? ?Interventions: Cognitive Behavioral Therapy and Solution-Oriented/Positive Psychology ? ?Diagnosis: ?  ICD-10-CM   ?1. MDD (major depressive disorder), recurrent, in partial remission ([place])  F33.41   ?  ? ? ?Plan: Patient is to use CBT and coping skills to decrease depression symptoms.  Patient is to continue participating in the reboot program.  Patient is to participate in her church Bible study and work on developing more relationships outside of the group time.  Patient is to exercise to release negative emotions appropriately.  Patient is to work on information about the neuro cycle by Dr. Tawanna Solo.  Patient is to try to get in a habit of studying her reboot information every day to prepare for leaving the groups.  Patient is to take medication as directed. ?Long-term goal: Elevate mood and show evidence of usual energy activities and socialization level ?Short-term goal: Identify and replace depressive thinking that leads to depressive feelings and actions ? ?Diana Ayers, [facility] ? ? ? ? ? ? ? ? ? ? ? ? ? ? ? ? ? ? ?

## 2021-06-05 ENCOUNTER — Ambulatory Visit: Payer: 59 | Admitting: Psychiatry

## 2021-06-05 DIAGNOSIS — F3341 Major depressive disorder, recurrent, in partial remission: Secondary | ICD-10-CM | POA: Diagnosis not present

## 2021-06-05 NOTE — Progress Notes (Signed)
?    [facility] Counselor/Therapist Progress Note ? ?Patient ID: Diana Ayers, MRN: [id number],   ? ?Date: 06/05/2021 ? ?Time Spent: 52 minutes start time 11:06 AM end time 11:58 AM ? ?Treatment Type: Individual Therapy ? ?Reported Symptoms: anxiety, triggered responses, sadness, fatigue, rumination ? ?Mental Status Exam: ? ?Appearance:   Well Groomed     ?Behavior:  Appropriate  ?Motor:  Normal  ?Speech/Language:   Normal Rate  ?Affect:  Appropriate  ?Mood:  normal  ?Thought process:  normal  ?Thought content:    WNL  ?Sensory/Perceptual disturbances:    WNL  ?Orientation:  oriented to person, place, time/date, and situation  ?Attention:  Good  ?Concentration:  Good  ?Memory:  WNL  ?Fund of knowledge:   Good  ?Insight:    Good  ?Judgment:   Good  ?Impulse Control:  Good  ? ?Risk Assessment: ?Danger to Self:  No ?Self-injurious Behavior: No ?Danger to Others: No ?Duty to Warn:no ?Physical Aggression / Violence:No  ?Access to Firearms a concern: No  ?Gang Involvement:No  ? ?Subjective: Patient was present for session.  She shared that she had a physical and she will be working with a weight loss program due to weight issues.  She went on to share that her levels over all were positive.Her cholesterol was very high so she is starting a high cholesterol medicine. She is starting the reboot course on June 12  virtually.  Patient stated that overall she is saying that there is progress.  She is feeling very stressed and concerned about her health issues.  Discussed some of her concerns and different strategies to help her work through them.  Patient was encouraged to continue working with her medical providers.  She was also encouraged to do some of her own research and figuring out what sort of diet is best for her medical concerns.  Was given some information to kind of explore to see if that would be a positive option for her.  Patient was also reminded to focus on the things that she can control fix and  change and to do what she can for herself rather than ruminating on things that she can do nothing about.  Patient reported feeling positive about plans from session. ? ?Interventions: Cognitive Behavioral Therapy and Solution-Oriented/Positive Psychology ? ?Diagnosis: ?  ICD-10-CM   ?1. MDD (major depressive disorder), recurrent, in partial remission ([place])  F33.41   ?  ? ? ?Plan: Patient is to use CBT and coping skills to decrease depression symptoms.  Patient is to continue participating/repairing in/for the reboot program.  Patient is to participate in her church Bible study and work on developing more relationships outside of the group time.  Patient is to exercise to release negative emotions appropriately.  Patient is to work on information about the neuro cycle by Dr. Tawanna Solo.  Patient is to try to get in a habit of studying her reboot information every day to prepare for leaving the groups.  Patient is to take medication as directed.  Patient is to work with other medical providers on medical issues that are concerning for her.  She is also to research different diets that could be helpful for her. ?Long-term goal: Elevate mood and show evidence of usual energy activities and socialization level ?Short-term goal: Identify and replace depressive thinking that leads to depressive feelings and actions ? ?[name], [facility] ? ? ? ? ? ? ? ? ? ? ? ? ? ? ? ? ? ? ?

## 2021-06-06 ENCOUNTER — Telehealth (INDEPENDENT_AMBULATORY_CARE_PROVIDER_SITE_OTHER): Payer: 59 | Admitting: Psychiatry

## 2021-06-06 ENCOUNTER — Encounter: Payer: Self-pay | Admitting: Family Medicine

## 2021-06-06 ENCOUNTER — Encounter: Payer: Self-pay | Admitting: Psychiatry

## 2021-06-06 DIAGNOSIS — F32A Depression, unspecified: Secondary | ICD-10-CM

## 2021-06-06 DIAGNOSIS — F4312 Post-traumatic stress disorder, chronic: Secondary | ICD-10-CM

## 2021-06-06 DIAGNOSIS — F330 Major depressive disorder, recurrent, mild: Secondary | ICD-10-CM | POA: Diagnosis not present

## 2021-06-06 MED ORDER — BREXPIPRAZOLE 1 MG PO TABS: 1.0000 mg | ORAL_TABLET | Freq: Every day | ORAL | 2 refills | Status: DC

## 2021-06-06 MED ORDER — CARBAMAZEPINE ER 300 MG PO CP12: 600.0000 mg | ORAL_CAPSULE | Freq: Every day | ORAL | 0 refills | Status: DC

## 2021-06-06 MED ORDER — VENLAFAXINE HCL ER 150 MG PO CP24: 300.0000 mg | ORAL_CAPSULE | Freq: Every day | ORAL | 1 refills | Status: DC

## 2021-06-06 MED ORDER — TRAZODONE HCL 100 MG PO TABS: ORAL_TABLET | ORAL | 5 refills | Status: DC

## 2021-06-06 MED ORDER — CLONAZEPAM 1 MG PO TABS: ORAL_TABLET | ORAL | 5 refills | Status: DC

## 2021-06-06 NOTE — Progress Notes (Signed)
Diana Ayers [id number] 1960-02-18 61 y.o.  Virtual Visit via Video Note  I connected with pt @ on 06/06/21 at  1:00 PM EDT by a video enabled telemedicine application and verified that I am speaking with the correct person using two identifiers.   I discussed the limitations of evaluation and management by telemedicine and the availability of in person appointments. The patient expressed understanding and agreed to proceed.  I discussed the assessment and treatment plan with the patient. The patient was provided an opportunity to ask questions and all were answered. The patient agreed with the plan and demonstrated an understanding of the instructions.   The patient was advised to call back or seek an in-person evaluation if the symptoms worsen or if the condition fails to improve as anticipated.  I provided 45 minutes of non-face-to-face time during this encounter.  The patient was located at home.  The provider was located at home.   Diana Ayers, PMHNP   Subjective:   Patient ID:  Diana Ayers is a 61 y.o. (DOB 27-Jan-1960) female.  Chief Complaint:  Chief Complaint  Patient presents with   Follow-up    Depression, anxiety, insomnia    HPI Diana Ayers presents for follow-up of anxiety, depression, and insomnia. She reports that she had comprehensive physical exam with PCP. Her Vit D level is now 37. Her Hgb A1C was ok. She reports that her cholesterol was elevated and her total cholesterol was 304 and LDL was 215. Calcium was 9.9. She has decided to seek treatment through [facility] clinic to help with lifestyle and diet changes to lower cholesterol and weight. She asks about other possible strategies to improve wellness.   She reports that her mood has improved and is interested in reducing Rexulti. She reports that she reduced Rexulti to 1 mg daily about 1.5 weeks ago. She reports that her mood is usually improved during spring and summer. Energy  and motivation have been good. Walking daily.   She reports, "I still do feel anxiety."  Some worry about remodel project. Denies any panic s/s. She reports some increase in hypervigilance after trial in the news about someone killing people in their sleep. She reports exaggerated startle response. Denies any recent flashbacks. She reports occ nightmares. She reports that she was having some difficulty getting enough sleep and was staying up until midnight and getting up around 5-6 am. She has started Melatonin 3 mg QHS and is now sleeping 8 hours a night. Concentration has been good. She reports difficulty with memory. She reports that she will read some information and have difficulty retaining new information. Difficulty recalling certain things. Denies SI.   Starting kitchen remodel next week.   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   Past Psychiatric Medication Trials: Klonopin- Reports that she was once on 6 mg total daily dose and has decreased to 3 mg. Reports Klonopin has been helpful for anxiety.  Trazodone- Effective. Has some excessive daytime somnolence. Latuda Rexulti Effexor XR- Has been helpful for mood and anxiety.  Prozac Paxil Zoloft Wellbutrin Carbamazepine XR- Has been helpful for episodic agitation/irritability/aggression Depakote- Helpful  Nortriptyline    Review of Systems:  Review of Systems  Gastrointestinal:  Positive for constipation.  Musculoskeletal:  Negative for gait problem.  Neurological:  Negative for tremors.  Psychiatric/Behavioral:         Please refer to HPI   Medications: I have reviewed the patient's current medications.  Current Outpatient Medications  Medication Sig Dispense Refill   Melatonin 3 MG CAPS Take by mouth.     rosuvastatin (CRESTOR) 20 MG tablet Take 20 mg by mouth at bedtime.     brexpiprazole (REXULTI) 1 MG TABS tablet Take 1 tablet (1 mg total) by mouth daily. 30 tablet 2    carbamazepine (CARBATROL) 300 MG 12 hr capsule Take 2 capsules (600 mg total) by mouth at bedtime. 180 capsule 0   cholecalciferol (VITAMIN D3) 25 MCG (1000 UNIT) tablet Take 2,000 Units by mouth daily.     [START ON 07/21/2021] clonazePAM (KLONOPIN) 1 MG tablet TAKE THREE TABLETS BY MOUTH EVERY NIGHT AT BEDTIME AND TAKE 1 TO 2 TABLETS AS NEEDED FOR ANXIETY *MUST LAST 30 DAYS* 120 tablet 5   diclofenac Sodium (VOLTAREN) 1 % GEL Voltaren 1 % topical gel     L-Methylfolate 15 MG TABS Take 1 tablet (15 mg total) by mouth daily. 30 tablet 5   loratadine (CLARITIN) 10 MG tablet Take 10 mg by mouth daily as needed.     meloxicam (MOBIC) 15 MG tablet TAKE ONE TABLET BY MOUTH DAILY 30 tablet 3   Multiple Vitamins-Minerals (MULTIVITAMIN GUMMIES ADULT PO) Take by mouth.     SUMAtriptan (IMITREX) 100 MG tablet Take 1 tablet (100 mg total) by mouth once as needed for migraine or headache. May repeat in 2 hours if headache persists or recurs. 10 tablet 1   traZODone (DESYREL) 100 MG tablet Take 2-3 tabs po QHS 90 tablet 5   venlafaxine XR (EFFEXOR XR) 150 MG 24 hr capsule Take 2 capsules (300 mg total) by mouth daily with breakfast. 180 capsule 1   vitamin C (ASCORBIC ACID) 250 MG tablet Take 250 mg by mouth daily.     Vitamin D, Ergocalciferol, (DRISDOL) 1.25 MG (50000 UNIT) CAPS capsule Take 1 capsule (50,000 Units total) by mouth once a week. 12 capsule 1   No current facility-administered medications for this visit.    Medication Side Effects: None Denies involuntary movements  Allergies:  Allergies  Allergen Reactions   Onion Other (See Comments)   Tape Other (See Comments)    RASH, PT ALSO STATES RASH WITH COBAN    Past Medical History:  Diagnosis Date   Anxiety    Depression    FHx: malignant neoplasm of breast in first degree relative 07/23/2011   Hyperlipidemia    PTSD (post-traumatic stress disorder)    Thyroid disease     Family History  Problem Relation Age of Onset   Breast cancer  Mother 3       BRCA/BART negative   Lung cancer Mother 66       smoker   Alcohol abuse Mother    Anemia Mother    Osteoporosis Mother    Breast cancer Sister 32   Melanoma Sister 46   Osteoporosis Sister    Dementia Father    Breast cancer Maternal Aunt 83   Breast cancer Sister 39       BRCA-/no BART   Lung cancer Maternal Uncle        smoker   Lung cancer Maternal Uncle        smoker   Heart attack Maternal Aunt    Schizophrenia Maternal Aunt    Schizophrenia Maternal Aunt    Schizophrenia Maternal Uncle     Social History   Socioeconomic History   Marital status: Single    Spouse name: Not on file   Number of children: 0  Years of education: Not on file   Highest education level: Bachelor's degree (e.g., BA, AB, BS)  Occupational History   Not on file  Tobacco Use   Smoking status: Never   Smokeless tobacco: Never  Vaping Use   Vaping Use: Never used  Substance and Sexual Activity   Alcohol use: No   Drug use: No   Sexual activity: Not Currently  Other Topics Concern   Not on file  Social History Narrative   Not on file   Social Determinants of Health   Financial Resource Strain: Not on file  Food Insecurity: Not on file  Transportation Needs: Not on file  Physical Activity: Not on file  Stress: Not on file  Social Connections: Not on file  Intimate Partner Violence: Not on file    Past Medical History, Surgical history, Social history, and Family history were reviewed and updated as appropriate.   Please see review of systems for further details on the patient's review from today.   Objective:   Physical Exam:  There were no vitals taken for this visit.  Physical Exam Neurological:     Mental Status: She is alert and oriented to person, place, and time.     Cranial Nerves: No dysarthria.  Psychiatric:        Attention and Perception: Attention and perception normal.        Mood and Affect: Mood is anxious. Mood is not depressed.         Speech: Speech normal.        Behavior: Behavior is cooperative.        Thought Content: Thought content normal. Thought content is not paranoid or delusional. Thought content does not include homicidal or suicidal ideation. Thought content does not include homicidal or suicidal plan.        Cognition and Memory: Cognition and memory normal.        Judgment: Judgment normal.     Comments: Insight intact    Lab Review:     Component Value Date/Time   NA 138 11/21/2019 1205   NA 140 07/20/2008 1030   K 4.3 11/21/2019 1205   K 3.9 07/20/2008 1030   CL 101 11/21/2019 1205   CL 101 07/20/2008 1030   CO2 30 11/21/2019 1205   CO2 30 07/20/2008 1030   GLUCOSE 98 11/21/2019 1205   GLUCOSE 106 07/20/2008 1030   BUN 11 11/21/2019 1205   BUN 13 07/20/2008 1030   CREATININE 0.65 11/21/2019 1205   CALCIUM 9.9 11/21/2019 1205   CALCIUM 9.2 07/20/2008 1030   PROT 7.0 11/21/2019 1205   PROT 7.5 07/20/2008 1030   ALBUMIN 4.3 07/23/2011 1427   ALBUMIN 3.7 07/20/2008 1030   AST 16 11/21/2019 1205   AST 22 07/20/2008 1030   ALT 14 11/21/2019 1205   ALT 22 07/20/2008 1030   ALKPHOS 55 07/23/2011 1427   ALKPHOS 67 07/20/2008 1030   BILITOT 0.4 11/21/2019 1205   BILITOT 0.50 07/20/2008 1030       Component Value Date/Time   WBC 5.5 11/21/2019 1205   RBC 4.58 11/21/2019 1205   HGB 14.6 11/21/2019 1205   HGB 13.3 07/23/2011 1427   HCT 42.8 11/21/2019 1205   HCT 40.8 07/23/2011 1427   PLT 210 11/21/2019 1205   PLT 196 07/23/2011 1427   MCV 93.4 11/21/2019 1205   MCV 95.0 07/23/2011 1427   MCH 31.9 11/21/2019 1205   MCHC 34.1 11/21/2019 1205   RDW 11.8 11/21/2019 1205  RDW 12.4 07/23/2011 1427   LYMPHSABS 2.3 07/23/2011 1427   MONOABS 0.5 07/23/2011 1427   EOSABS 0.2 07/23/2011 1427   EOSABS 0.2 07/20/2008 1030   BASOSABS 0.1 07/23/2011 1427    No results found for: POCLITH, LITHIUM   Lab Results  Component Value Date   CBMZ 8.4 11/21/2019     .res Assessment: Plan:   Pt  seen for 45 minutes and time spent reviewing lab results and discussing her concerns about learning that she has elevated cholesterol, and anxiety about possible increased risk of cardiovascular risks considering family history. Discussed that elevated cholesterol can be hereditary but can also be affected by lifestyle and agreed with plan to start statin and the wellness clinic to help address lifestyle factors. She reports that she would like to try to reduce Rexulti in case this is having a negative impact on her weight and since her depression has improved. She reports that she reduced Rexulti to 2 mg 1/2 tablet daily about 1.5 weeks ago. Discussed that Rexulti has a long half-life and that effects of dose reduction or discontinuation are not evident immediately and may take 3-4 weeks. Discussed that she would not have withdrawal if she stopped Rexulti at this time. Recommended continuing 1/2 of a Rexulti 2 mg tablet for 3-4 weeks to determine if there has been any change in her mood or anxiety before stopping Rexulti. Pt agrees with this plan and requests that script for Rexulti 1 mg be put on file at her pharmacy in case she decides that she would like to continue on the 1 mg dose. Advised pt to contact office if she notices worsening mood or anxiety s/s. Continue Carbamazepine 600 mg at bedtime for mood symptoms.  Continue Klonopin 1 mg up to 3 tabs QHS and 1-2 tabs as needed for anxiety. Continue Trazodone 200-300 mg po QHS for insomnia.  Continue Effexor XR 300 mg po qd for anxiety and depression.  Recommend continuing therapy with [name], [facility].  Patient advised to contact office with any questions, adverse effects, or acute worsening in signs and symptoms.    Rea was seen today for follow-up.  Diagnoses and all orders for this visit:  MDD (major depressive disorder), recurrent episode, mild ([facility]) -     brexpiprazole (REXULTI) 1 MG TABS tablet; Take 1 tablet (1 mg total) by mouth  daily.  Depression, unspecified depression type -     carbamazepine (CARBATROL) 300 MG 12 hr capsule; Take 2 capsules (600 mg total) by mouth at bedtime. -     venlafaxine XR (EFFEXOR XR) 150 MG 24 hr capsule; Take 2 capsules (300 mg total) by mouth daily with breakfast.  Chronic post-traumatic stress disorder (PTSD) -     clonazePAM (KLONOPIN) 1 MG tablet; TAKE THREE TABLETS BY MOUTH EVERY NIGHT AT BEDTIME AND TAKE 1 TO 2 TABLETS AS NEEDED FOR ANXIETY *MUST LAST 30 DAYS* -     traZODone (DESYREL) 100 MG tablet; Take 2-3 tabs po QHS -     venlafaxine XR (EFFEXOR XR) 150 MG 24 hr capsule; Take 2 capsules (300 mg total) by mouth daily with breakfast.     Please see After Visit Summary for patient specific instructions.  Future Appointments  Date Time Provider [place]  06/19/2021 11:00 AM [name], [facility] CP-CP None  07/01/2021 12:00 PM [name], [facility] CP-CP None  07/18/2021 10:00 AM [name], [facility] CP-CP None  08/06/2021 10:00 AM [name], [facility] CP-CP None    No  orders of the defined types were placed in this encounter.     -------------------------------

## 2021-06-16 ENCOUNTER — Other Ambulatory Visit: Payer: Self-pay | Admitting: Psychiatry

## 2021-06-16 DIAGNOSIS — F330 Major depressive disorder, recurrent, mild: Secondary | ICD-10-CM

## 2021-06-19 ENCOUNTER — Ambulatory Visit: Payer: 59 | Admitting: Psychiatry

## 2021-07-01 ENCOUNTER — Ambulatory Visit (INDEPENDENT_AMBULATORY_CARE_PROVIDER_SITE_OTHER): Payer: 59 | Admitting: Psychiatry

## 2021-07-01 DIAGNOSIS — F330 Major depressive disorder, recurrent, mild: Secondary | ICD-10-CM

## 2021-07-01 NOTE — Progress Notes (Signed)
[facility] Counselor/Therapist Progress Note  Patient ID: Diana Ayers, MRN: [id number],    Date: 07/01/2021  Time Spent: 50 minutes start time 12:03 PM end time 12:53 PM Virtual Visit via Video Note Connected with patient by a telemedicine/telehealth application, with their informed consent, and verified patient privacy and that I am speaking with the correct person using two identifiers. I discussed the limitations, risks, security and privacy concerns of performing psychotherapy and the availability of in person appointments. I also discussed with the patient that there may be a patient responsible charge related to this service. The patient expressed understanding and agreed to proceed. I discussed the treatment planning with the patient. The patient was provided an opportunity to ask questions and all were answered. The patient agreed with the plan and demonstrated an understanding of the instructions. The patient was advised to call  our office if  symptoms worsen or feel they are in a crisis state and need immediate contact.   Therapist Location: office Patient Location: home    Treatment Type: Individual Therapy  Reported Symptoms: anxiety, triggered responses, sadness, low motivation  Mental Status Exam:  Appearance:   Casual and Neat     Behavior:  Appropriate  Motor:  Normal  Speech/Language:   Normal Rate  Affect:  Appropriate  Mood:  anxious  Thought process:  normal  Thought content:    WNL  Sensory/Perceptual disturbances:    WNL  Orientation:  oriented to person, place, time/date, and situation  Attention:  Good  Concentration:  Good  Memory:  WNL  Fund of knowledge:   Good  Insight:    Good  Judgment:   Good  Impulse Control:  Good   Risk Assessment: Danger to Self:  No Self-injurious Behavior: No Danger to Others: No Duty to Warn:no Physical Aggression / Violence:No  Access to Firearms a concern: No  Gang Involvement:No   Subjective:  Patient was present for session. 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  Also her sister had called and stated her mother was missing and didn't call her back after she found out where she was.  She went on to share that she and her roommate got into an argument.  She shared that trying to get the renovations completed at her house has kept her trapped in her home for the past few weeks. Acknowledged that the situation can lead to more depression so she has to work on her self care and taking time away to deal with it. Patient shared her anxiety about the course.  Discussed the importance of perspective as she is leading the group to help her realize what she does know.  Reminded patient of her skills and the ones that she can share is she chooses.  Encouraged her to write out the skills or things to help her have perspective to read as she needs to.  Patient was also encouraged to remind herself to stay grounded when it comes to her family and the health situations with each of them is that she can only focus on living them and doing what she can but she cannot fix the situation.  Interventions: Cognitive Behavioral Therapy and Insight-Oriented  Diagnosis:   ICD-10-CM   1. MDD (major depressive disorder), recurrent episode, mild ([place])  F33.0       Plan: Patient is to use CBT and coping skills to decrease depression symptoms.  Patient is to continue participating/repairing  in/for the reboot program.  Patient is to participate in her church Bible study and work on developing more relationships outside of the group time.  Patient is to exercise to release negative emotions appropriately.  Patient is to work on information about the neuro cycle by Dr. Tawanna Solo.  Patient is to try to get in a habit of studying her reboot information every day to prepare for leaving the groups.  Patient is to take medication as directed.  Patient is to work with other medical  providers on medical issues that are concerning for her.  She is also to research different diets that could be helpful for her. Long-term goal: Elevate mood and show evidence of usual energy activities and socialization level Short-term goal: Identify and replace depressive thinking that leads to depressive feelings and actions  [name], [facility]

## 2021-07-03 ENCOUNTER — Ambulatory Visit: Payer: 59 | Admitting: Psychiatry

## 2021-07-18 ENCOUNTER — Ambulatory Visit: Payer: 59 | Admitting: Psychiatry

## 2021-08-06 ENCOUNTER — Ambulatory Visit (INDEPENDENT_AMBULATORY_CARE_PROVIDER_SITE_OTHER): Payer: 59 | Admitting: Psychiatry

## 2021-08-06 DIAGNOSIS — F3341 Major depressive disorder, recurrent, in partial remission: Secondary | ICD-10-CM | POA: Diagnosis not present

## 2021-08-06 NOTE — Progress Notes (Addendum)
[facility] Counselor/Therapist Progress Note  Patient ID: Diana Ayers, MRN: [id number],    Date: 08/06/2021  Time Spent: 42 minutes start time 10:02 AM end time 10:44 AM Virtual Visit via Video Note Connected with patient by a telemedicine/telehealth application, with their informed consent, and verified patient privacy and that I am speaking with the correct person using two identifiers. I discussed the limitations, risks, security and privacy concerns of performing psychotherapy and the availability of in person appointments. I also discussed with the patient that there may be a patient responsible charge related to this service. The patient expressed understanding and agreed to proceed. I discussed the treatment planning with the patient. The patient was provided an opportunity to ask questions and all were answered. The patient agreed with the plan and demonstrated an understanding of the instructions. The patient was advised to call  our office if  symptoms worsen or feel they are in a crisis state and need immediate contact.   Therapist Location: office Patient Location: home    Treatment Type: Individual Therapy  Reported Symptoms: health issues, fatigue, depression, triggered responses, anxiety, weight loss  Mental Status Exam:  Appearance:   Well Groomed     Behavior:  Appropriate  Motor:  Normal  Speech/Language:   Normal Rate  Affect:  Labile  Mood:  labile  Thought process:  normal  Thought content:    WNL  Sensory/Perceptual disturbances:    WNL  Orientation:  oriented to person, place, time/date, and situation  Attention:  Good  Concentration:  Good  Memory:  WNL  Fund of knowledge:   Good  Insight:    Good  Judgment:   Good  Impulse Control:  Good   Risk Assessment: Danger to Self:  No Self-injurious Behavior: No Danger to Others: No Duty to Warn:no Physical Aggression / Violence:No  Access to Firearms a concern: No  Gang Involvement:No    Subjective: Patient had to meet virtually due to having health issues.  She shared she has had stomach issues for a while which has created fatigue, she had to cancel the last session due to the issues.  She shared she is also having lots of people at her house for the remodel and that has been a lot but things look better. She shared that she is having anxiety over the whole situation.  Discussed importance of working on the positives and things to be thankful for even when it is hard.  The PTSD group has gone well overall. She is doing well with her co-leader. She shared that she isn't helping her as much but she is handling it okay.  She stated when she feels good she is able to follow up with participants in the group but not feeling well is making it hard.  She shared she did go to [facility] for her sister's [place]re[place] Caprice, there were stressors there with her family. Discussed perspective and the importance of having gratitude and focusing on the positives even when she is struggling.  Was also encouraged to monitor her mood since not being able to hold things in her stomach could impact absorption of medications agreed to let her provider Diana Ayers, PMHNP no about the situation.  Patient was encouraged to try and make sure she does things in small amounts including interacting with others.  Encouraged her to remember she still has to feel productive and have connections with others.  Patient agreed to work on that.  She needed to  and session a little early due to fatigue.  Interventions: Cognitive Behavioral Therapy and Solution-Oriented/Positive Psychology  Diagnosis:   ICD-10-CM   1. MDD (major depressive disorder), recurrent, in partial remission ([place])  F33.41       Plan: Patient is to use CBT and coping skills to decrease depression symptoms.  Patient is to continue participating in the reboot program.  Patient is to participate in her church Bible study and work on developing more  relationships outside of the group time.  Patient is to exercise to release negative emotions appropriately.  Patient is to work on information about the neuro cycle by Dr. Tawanna Solo.  Patient is to try to get in a habit of studying her reboot information every day to prepare for leaving the groups.  Patient is to take medication as directed.  Patient is to work with other medical providers on medical issues that are concerning for her.  She is also to research different diets that could be helpful for her. Long-term goal: Elevate mood and show evidence of usual energy activities and socialization level Short-term goal: Identify and replace depressive thinking that leads to depressive feelings and actions  [name], [facility]

## 2021-08-12 ENCOUNTER — Other Ambulatory Visit: Payer: Self-pay | Admitting: Psychiatry

## 2021-08-12 DIAGNOSIS — F4312 Post-traumatic stress disorder, chronic: Secondary | ICD-10-CM

## 2021-08-13 ENCOUNTER — Telehealth (INDEPENDENT_AMBULATORY_CARE_PROVIDER_SITE_OTHER): Payer: 59 | Admitting: Psychiatry

## 2021-08-13 ENCOUNTER — Encounter: Payer: Self-pay | Admitting: Psychiatry

## 2021-08-13 DIAGNOSIS — F331 Major depressive disorder, recurrent, moderate: Secondary | ICD-10-CM | POA: Diagnosis not present

## 2021-08-13 DIAGNOSIS — E559 Vitamin D deficiency, unspecified: Secondary | ICD-10-CM | POA: Diagnosis not present

## 2021-08-13 DIAGNOSIS — F4310 Post-traumatic stress disorder, unspecified: Secondary | ICD-10-CM

## 2021-08-13 MED ORDER — CARBAMAZEPINE ER 300 MG PO CP12: 600.0000 mg | ORAL_CAPSULE | Freq: Every day | ORAL | 0 refills | Status: DC

## 2021-08-13 MED ORDER — VITAMIN D (ERGOCALCIFEROL) 1.25 MG (50000 UNIT) PO CAPS
50000.0000 [IU] | ORAL_CAPSULE | ORAL | 1 refills | Status: DC
Start: 2021-08-13 — End: 2022-04-04

## 2021-08-13 MED ORDER — BREXPIPRAZOLE 2 MG PO TABS
2.0000 mg | ORAL_TABLET | Freq: Every day | ORAL | 0 refills | Status: DC
Start: 2021-08-13 — End: 2021-09-30

## 2021-08-13 NOTE — Progress Notes (Signed)
Diana Ayers [id number] Jul 17, 1960 61 y.o.  Virtual Visit via Video Note  I connected with pt @ on 08/13/21 at  2:30 PM EDT by a video enabled telemedicine application and verified that I am speaking with the correct person using two identifiers.   I discussed the limitations of evaluation and management by telemedicine and the availability of in person appointments. The patient expressed understanding and agreed to proceed.  I discussed the assessment and treatment plan with the patient. The patient was provided an opportunity to ask questions and all were answered. The patient agreed with the plan and demonstrated an understanding of the instructions.   The patient was advised to call back or seek an in-person evaluation if the symptoms worsen or if the condition fails to improve as anticipated.  I provided 40 minutes of non-face-to-face time during this encounter.  The patient was located at home.  The provider was located at home.   Thayer Headings, PMHNP   Subjective:   Patient ID:  Diana Ayers is a 61 y.o. (DOB [birth year]) female.  Chief Complaint:  Chief Complaint  Patient presents with   Depression   Anxiety    HPI Diana Ayers presents for follow-up of depression, anxiety, and insomnia.   She reports that she has had some medical issues. 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 She had food poisoning about a month ago and this improved but she has had residual GI s/s. She has had decreased appetite and low energy due to illness. She reports losing 5-6 lbs. She has been trying to drink water regularly. "After awhile I think I get a little depressed." She reports that she has some anxiety in response to kitchen remodel and making decisions related to this. She reports some worry and rumination. Denies any panic symptoms. She reports sleeping well about 70% of the time. Some occ  difficulty returning to sleep at times due to anxious thoughts. Motivation has been low. Denies any difficulty with concentration. Denies SI.   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 She reports that different participants have shared their story with her and this may have caused her some increased anxiety. She reports that last night's meeting was dealing with her more difficulty PTSD s/s.   She has been remodeling her kitchen and has been having to deal with this when not feeling well.   Went to [place] to celebrate her sister's birthday and "this was hard."   Taking Klonopin 2.5 mg po qhs most nights and then taking an additional 1/2 tab as needed.   Past Psychiatric Medication Trials: Klonopin- Reports that she was once on 6 mg total daily dose and has decreased to 3 mg. Reports Klonopin has been helpful for anxiety.  Trazodone- Effective. Has some excessive daytime somnolence. Latuda Rexulti Effexor XR- Has been helpful for mood and anxiety.  Prozac Paxil Zoloft Wellbutrin Carbamazepine XR- Has been helpful for episodic agitation/irritability/aggression Depakote- Helpful  Nortriptyline    Review of Systems:  Review of Systems  HENT:  Positive for ear pain and hearing loss.   Gastrointestinal:  Positive for diarrhea and nausea. Negative for vomiting.  Musculoskeletal:  Negative for gait problem.  Neurological:  Negative for tremors.  Psychiatric/Behavioral:         Please refer to HPI    Medications: I have reviewed the patient's current medications.  Current Outpatient Medications  Medication Sig Dispense Refill   acetic acid-hydrocortisone (VOSOL-HC) OTIC solution Place 4 drops into the left ear 3 times daily.     Bacillus Coagulans-Inulin (BENEFIBER PREBIOTIC+PROBIOTIC PO) Take by mouth.     clonazePAM (KLONOPIN) 1 MG tablet TAKE THREE TABLETS BY MOUTH EVERY NIGHT AT BEDTIME AND TAKE ONE TO TWO TABLETS BY MOUTH AS NEEDED FOR ANXIETY **MUST LAST  30 DAYS** 120 tablet 2   diclofenac Sodium (VOLTAREN) 1 % GEL Voltaren 1 % topical gel     L-Methylfolate 15 MG TABS Take 1 tablet (15 mg total) by mouth daily. 30 tablet 5   loratadine (CLARITIN) 10 MG tablet Take 10 mg by mouth daily as needed.     Melatonin 3 MG CAPS Take by mouth.     ondansetron (ZOFRAN) 4 MG tablet 1 tablet     pantoprazole (PROTONIX) 40 MG tablet Take 40 mg by mouth daily.     rosuvastatin (CRESTOR) 20 MG tablet Take 20 mg by mouth at bedtime.     traZODone (DESYREL) 100 MG tablet Take 2-3 tabs po QHS 90 tablet 5   venlafaxine XR (EFFEXOR XR) 150 MG 24 hr capsule Take 2 capsules (300 mg total) by mouth daily with breakfast. 180 capsule 1   vitamin C (ASCORBIC ACID) 250 MG tablet Take 250 mg by mouth daily.     brexpiprazole (REXULTI) 2 MG TABS tablet Take 1 tablet (2 mg total) by mouth daily. 90 tablet 0   carbamazepine (CARBATROL) 300 MG 12 hr capsule Take 2 capsules (600 mg total) by mouth at bedtime. 180 capsule 0   cholecalciferol (VITAMIN D3) 25 MCG (1000 UNIT) tablet Take 2,000 Units by mouth daily. (Patient not taking: Reported on 08/13/2021)     meloxicam (MOBIC) 15 MG tablet TAKE ONE TABLET BY MOUTH DAILY (Patient not taking: Reported on 08/13/2021) 30 tablet 3   Multiple Vitamins-Minerals (MULTIVITAMIN GUMMIES ADULT PO) Take by mouth. (Patient not taking: Reported on 08/13/2021)     SUMAtriptan (IMITREX) 100 MG tablet Take 1 tablet (100 mg total) by mouth once as needed for migraine or headache. May repeat in 2 hours if headache persists or recurs. 10 tablet 1   Vitamin D, Ergocalciferol, (DRISDOL) 1.25 MG (50000 UNIT) CAPS capsule Take 1 capsule (50,000 Units total) by mouth once a week. 12 capsule 1   No current facility-administered medications for this visit.    Medication Side Effects: None  Allergies:  Allergies  Allergen Reactions   Onion Other (See Comments)   Tape Other (See Comments)    RASH, PT ALSO STATES RASH WITH COBAN    Past Medical  History:  Diagnosis Date   Anxiety    Depression    FHx: malignant neoplasm of breast in first degree relative 07/23/2011   Hyperlipidemia    PTSD (post-traumatic stress disorder)    Thyroid disease     Family History  Problem Relation Age of Onset   Breast cancer Mother 10       BRCA/BART negative   Lung cancer Mother 98       smoker   Alcohol abuse Mother    Anemia Mother    Osteoporosis Mother    Breast cancer Sister 102   Melanoma Sister 28   Osteoporosis Sister    Dementia Father    Breast cancer Maternal Aunt 60   Breast cancer Sister 33       BRCA-/no BART   Lung cancer Maternal Uncle        smoker  Lung cancer Maternal Uncle        smoker   Heart attack Maternal Aunt    Schizophrenia Maternal Aunt    Schizophrenia Maternal Aunt    Schizophrenia Maternal Uncle     Social History   Socioeconomic History   Marital status: Single    Spouse name: Not on file   Number of children: 0   Years of education: Not on file   Highest education level: Bachelor's degree (e.g., BA, AB, BS)  Occupational History   Not on file  Tobacco Use   Smoking status: Never   Smokeless tobacco: Never  Vaping Use   Vaping Use: Never used  Substance and Sexual Activity   Alcohol use: No   Drug use: No   Sexual activity: Not Currently  Other Topics Concern   Not on file  Social History Narrative   Not on file   Social Determinants of Health   Financial Resource Strain: Low Risk  (07/16/2017)   Overall Financial Resource Strain (CARDIA)    Difficulty of Paying Living Expenses: Not hard at all  Food Insecurity: No Food Insecurity (07/16/2017)   Hunger Vital Sign    Worried About Running Out of Food in the Last Year: Never true    Ran Out of Food in the Last Year: Never true  Transportation Needs: No Transportation Needs (07/16/2017)   PRAPARE - Hydrologist (Medical): No    Lack of Transportation (Non-Medical): No  Physical Activity: Inactive  (07/16/2017)   Exercise Vital Sign    Days of Exercise per Week: 0 days    Minutes of Exercise per Session: 0 min  Stress: Stress Concern Present (07/16/2017)   [place]    Feeling of Stress : Rather much  Social Connections: Somewhat Isolated (07/16/2017)   Social Connection and Isolation Panel [NHANES]    Frequency of Communication with Friends and Family: More than three times a week    Frequency of Social Gatherings with Friends and Family: Twice a week    Attends Religious Services: More than 4 times per year    Active Member of [employer] or Organizations: No    Attends Archivist Meetings: Never    Marital Status: Never married  Intimate Partner Violence: Not At Risk (07/16/2017)   Humiliation, Afraid, Rape, and Kick questionnaire    Fear of Current or Ex-Partner: No    Emotionally Abused: No    Physically Abused: No    Sexually Abused: No    Past Medical History, Surgical history, Social history, and Family history were reviewed and updated as appropriate.   Please see review of systems for further details on the patient's review from today.   Objective:   Physical Exam:  There were no vitals taken for this visit.  Physical Exam Neurological:     Mental Status: She is alert and oriented to person, place, and time.     Cranial Nerves: No dysarthria.  Psychiatric:        Attention and Perception: Attention and perception normal.        Mood and Affect: Mood is anxious and depressed.        Speech: Speech normal.        Behavior: Behavior is slowed. Behavior is cooperative.        Thought Content: Thought content normal. Thought content is not paranoid or delusional. Thought content does not include homicidal or suicidal ideation. Thought  content does not include homicidal or suicidal plan.        Cognition and Memory: Cognition and memory normal.        Judgment: Judgment normal.     Comments:  Insight intact     Lab Review:     Component Value Date/Time   NA 138 11/21/2019 1205   NA 140 07/20/2008 1030   K 4.3 11/21/2019 1205   K 3.9 07/20/2008 1030   CL 101 11/21/2019 1205   CL 101 07/20/2008 1030   CO2 30 11/21/2019 1205   CO2 30 07/20/2008 1030   GLUCOSE 98 11/21/2019 1205   GLUCOSE 106 07/20/2008 1030   BUN 11 11/21/2019 1205   BUN 13 07/20/2008 1030   CREATININE 0.65 11/21/2019 1205   CALCIUM 9.9 11/21/2019 1205   CALCIUM 9.2 07/20/2008 1030   PROT 7.0 11/21/2019 1205   PROT 7.5 07/20/2008 1030   ALBUMIN 4.3 07/23/2011 1427   ALBUMIN 3.7 07/20/2008 1030   AST 16 11/21/2019 1205   AST 22 07/20/2008 1030   ALT 14 11/21/2019 1205   ALT 22 07/20/2008 1030   ALKPHOS 55 07/23/2011 1427   ALKPHOS 67 07/20/2008 1030   BILITOT 0.4 11/21/2019 1205   BILITOT 0.50 07/20/2008 1030       Component Value Date/Time   WBC 5.5 11/21/2019 1205   RBC 4.58 11/21/2019 1205   HGB 14.6 11/21/2019 1205   HGB 13.3 07/23/2011 1427   HCT 42.8 11/21/2019 1205   HCT 40.8 07/23/2011 1427   PLT 210 11/21/2019 1205   PLT 196 07/23/2011 1427   MCV 93.4 11/21/2019 1205   MCV 95.0 07/23/2011 1427   MCH 31.9 11/21/2019 1205   MCHC 34.1 11/21/2019 1205   RDW 11.8 11/21/2019 1205   RDW 12.4 07/23/2011 1427   LYMPHSABS 2.3 07/23/2011 1427   MONOABS 0.5 07/23/2011 1427   EOSABS 0.2 07/23/2011 1427   EOSABS 0.2 07/20/2008 1030   BASOSABS 0.1 07/23/2011 1427    No results found for: "POCLITH", "LITHIUM"   Lab Results  Component Value Date   CBMZ 8.4 11/21/2019     .res Assessment: Plan:   Pt seen for 40 minutes and time spent discussing recent increase in depression and anxiety. Agreed with increase in Rexulti to 2 mg po qd to improve mood and anxiety since she was tolerating this dose in the past and it seemed to be helpful for mood and anxiety symptoms.  Recommended following up with medical provider (PCP or GI specialist) if GI sx's do not improve since she reports having  GI s/s for several weeks and that s/s have only partially improved since seeing urgent care provider 9 days ago.  Reviewed labs from PCP's office.  Will continue Effexor XR 300 mg po qd for anxiety and depression.  Continue Trazodone 200-300 mg po QHS for insomnia.  Continue klonopin 1 mg 2-3 tabs at bedtime and 1-2 tabs as needed.  Continue L-Methylfolate for augmentation of depression.  Will continue Vitamin D 50,000 IU weekly since Vitamin D level remains on the lower end of normal range (37) after supplementation.  Recommend continuing therapy with [name], [facility].  Pt to follow-up with this provider in 6-8 weeks or sooner if clinically indicated.  Patient advised to contact office with any questions, adverse effects, or acute worsening in signs and symptoms.  Britny was seen today for depression and anxiety.  Diagnoses and all orders for this visit:  PTSD (post-traumatic stress disorder)  Moderate episode of  recurrent major depressive disorder (HCC) -     brexpiprazole (REXULTI) 2 MG TABS tablet; Take 1 tablet (2 mg total) by mouth daily. -     carbamazepine (CARBATROL) 300 MG 12 hr capsule; Take 2 capsules (600 mg total) by mouth at bedtime.  Vitamin D deficiency -     Vitamin D, Ergocalciferol, (DRISDOL) 1.25 MG (50000 UNIT) CAPS capsule; Take 1 capsule (50,000 Units total) by mouth once a week.     Please see After Visit Summary for patient specific instructions.  Future Appointments  Date Time Provider [place]  09/30/2021  9:00 AM [name], [facility] CP-CP None  10/15/2021 10:00 AM [name], [facility] CP-CP None  10/29/2021 10:00 AM [name], [facility] CP-CP None  11/12/2021 11:00 AM [name], [facility] CP-CP None    No orders of the defined types were placed in this encounter.     -------------------------------

## 2021-09-12 ENCOUNTER — Other Ambulatory Visit: Payer: Self-pay | Admitting: Physician Assistant

## 2021-09-12 DIAGNOSIS — R1010 Upper abdominal pain, unspecified: Secondary | ICD-10-CM

## 2021-09-25 ENCOUNTER — Ambulatory Visit
Admission: RE | Admit: 2021-09-25 | Discharge: 2021-09-25 | Disposition: A | Discharge status: Home / Self Care | Payer: 59 | Source: Ambulatory Visit | Attending: Physician Assistant | Admitting: Physician Assistant

## 2021-09-25 DIAGNOSIS — R1010 Upper abdominal pain, unspecified: Secondary | ICD-10-CM

## 2021-09-30 ENCOUNTER — Ambulatory Visit (INDEPENDENT_AMBULATORY_CARE_PROVIDER_SITE_OTHER): Payer: 59 | Admitting: Psychiatry

## 2021-09-30 ENCOUNTER — Telehealth: Payer: Self-pay | Admitting: Psychiatry

## 2021-09-30 DIAGNOSIS — F331 Major depressive disorder, recurrent, moderate: Secondary | ICD-10-CM | POA: Diagnosis not present

## 2021-09-30 MED ORDER — BREXPIPRAZOLE 3 MG PO TABS: 3.0000 mg | ORAL_TABLET | Freq: Every day | ORAL | 0 refills | Status: DC

## 2021-09-30 NOTE — Telephone Encounter (Signed)
Recommend increasing Rexulti to 3 mg po qd to determine if this may help with depression before upcoming apt. Please let her know script has been sent to her pharmacy.

## 2021-09-30 NOTE — Telephone Encounter (Signed)
Patient notified of recommendations and Rx.

## 2021-09-30 NOTE — Telephone Encounter (Signed)
Diana Ayers called this morning at 10:47.  She reports that her depression has gotten worse lately. She has appt 9/22 but would like to know if there is a medication adjustment or addition that she can take to help the depression until she is seen 9/22.  Please call to discuss.  Pharmacy [facility] on [address].

## 2021-09-30 NOTE — Telephone Encounter (Signed)
Patient called saying her depression is increasing, rates as 6/10 today. She said she has a lot of gut issues that you are aware of and she has been undergoing workup with no definitive diagnosis at this time. She said she has been under a lot of stress and she says she knows this probably does not help the gut issues. She said she could skip a shower for 2-3 days but at some time during the day gets dressed daily. She said this is more for fear that someone will come to the door, that she would prefer to stay in her PJs. She is able to get to sleep with the medications she takes, but if she has to get up to bathroom she struggles to get back to sleep.   Taking 2.5 tabs of clonazepam No longer taking Imitrex Trazodone 150 mg Vitamin D.  Taking Rexulti and carbamazepine as prescribed.  She knows she has an appt in a couple of weeks, hopes to be able to do a med adjustment to keep her from going backwards before then. No SI. I asked if she tried to get outside and she was outside when I called her. She said she knew she should go for a walk but was sitting in her backyard. Told her with her low vitamin D and depression that getting outside was important.

## 2021-09-30 NOTE — Progress Notes (Signed)
    [facility] Counselor/Therapist Progress Note  Patient ID: Diana Ayers, MRN: [id number],    Date: 09/30/2021  Time Spent: 58 minutes start time 9:01 AM end time 9:59 AM Virtual Visit via Video Note Connected with patient by a telemedicine/telehealth application, with their informed consent, and verified patient privacy and that I am speaking with the correct person using two identifiers. I discussed the limitations, risks, security and privacy concerns of performing psychotherapy and the availability of in person appointments. I also discussed with the patient that there may be a patient responsible charge related to this service. The patient expressed understanding and agreed to proceed. I discussed the treatment planning with the patient. The patient was provided an opportunity to ask questions and all were answered. The patient agreed with the plan and demonstrated an understanding of the instructions. The patient was advised to call  our office if  symptoms worsen or feel they are in a crisis state and need immediate contact.   Therapist Location: office Patient Location: home    Treatment Type: Individual Therapy  Reported Symptoms: stomach issues, anxiety, depression, fatigue, low motivation,rumination  Mental Status Exam:  Appearance:   Well Groomed     Behavior:  Appropriate  Motor:  Normal  Speech/Language:   Normal Rate  Affect:  Appropriate  Mood:  anxious  Thought process:  normal  Thought content:    WNL  Sensory/Perceptual disturbances:    WNL  Orientation:  oriented to person, place, time/date, and situation  Attention:  Good  Concentration:  Good  Memory:  WNL  Fund of knowledge:   Good  Insight:    Good  Judgment:   Good  Impulse Control:  Good   Risk Assessment: Danger to Self:  No Self-injurious Behavior: No Danger to Others: No Duty to Warn:no Physical Aggression / Violence:No  Access to Firearms a concern: No  Gang Involvement:No    Subjective: Met with patient via virtual session.  She shared she was struggling due to stomach issues.  She shared that she is going through testing to try and figure out what is going on with her.  She is still working on her home and that has been very stressful.  She explained that her roommate is wanting her to get things done quicker but since she is not feeling great that has been hard from her.  Patient shared she has been having lots of issues with her.  Discussed different ways that she could communicate about the situation and set appropriate limits as she needs to.  Patient was encouraged to recognize that just because she wants her to do things a certain way does not mean that she needs to do things a certain way.  Patient was encouraged to recognize that she is encouraging her to interact with other people more because she seems to be happier when she does that and patient was encouraged to recognize that that is true.  Discussed different ways that she could interact with others that felt comfortable and safe and would build up to what was more normal in the past.  The importance of recognizing she does not do well when she is not getting outside and getting light was discussed with patient.  Discussed ways to start working on that on a regular basis.  Discussed getting the right thoughts back into her head and focusing on what she needs to remind herself.  Patient was also encouraged to start trying to find ways that she   could increase left her and have humor.  Patient stated that even the things on TV she has been watching have been very intense.  Encouraged her to change that and to start finding some ways to laugh to increase serotonin naturally as well as looking at what she is eating and making sure she is seeing how it impacts her physically since she is having so many GI problems.  Patient was encouraged to look up the whole 30 diet to see if that might be something she would be interested  in trying.  Patient acknowledged that not fishing for 8 weeks is a negative thing for her set up a time for her to try and go fishing.  Patient was encouraged to remind herself of the things that packed her mood positively in the past and try and focus on those things.  Patient agreed to follow through with plans from session.  Interventions: Cognitive Behavioral Therapy, Solution-Oriented/Positive Psychology, and Insight-Oriented  Diagnosis:   ICD-10-CM   1. Moderate episode of recurrent major depressive disorder (HCC)  F33.1       Plan: Patient is to use CBT and coping skills to decrease depression symptoms.  Patient is to get outside regularly, start fishing again, find ways to have laughter in her day, and research whole 30 diet.  Patient is to continue participating in the reboot program.  Patient is to participate in her church Bible study and work on developing more relationships outside of the group time.  Patient is to exercise to release negative emotions appropriately.  Patient is to work on information about the neuro cycle by Dr. Caroline leaf.  Patient is to try to get in a habit of studying her reboot information every day to prepare for leaving the groups.  Patient is to take medication as directed.  Patient is to work with other medical providers on medical issues that are concerning for her.  She is also to research different diets that could be helpful for her. Long-term goal: Elevate mood and show evidence of usual energy activities and socialization level Short-term goal: Identify and replace depressive thinking that leads to depressive feelings and actions   , LCMHC                   

## 2021-09-30 NOTE — Telephone Encounter (Signed)
LVM to RC 

## 2021-10-02 ENCOUNTER — Ambulatory Visit: Payer: 59 | Admitting: Psychiatry

## 2021-10-11 ENCOUNTER — Ambulatory Visit: Payer: 59 | Admitting: Psychiatry

## 2021-10-11 ENCOUNTER — Telehealth (INDEPENDENT_AMBULATORY_CARE_PROVIDER_SITE_OTHER): Payer: 59 | Admitting: Psychiatry

## 2021-10-11 ENCOUNTER — Encounter: Payer: Self-pay | Admitting: Psychiatry

## 2021-10-11 DIAGNOSIS — F332 Major depressive disorder, recurrent severe without psychotic features: Secondary | ICD-10-CM | POA: Diagnosis not present

## 2021-10-11 DIAGNOSIS — F331 Major depressive disorder, recurrent, moderate: Secondary | ICD-10-CM

## 2021-10-11 DIAGNOSIS — G4700 Insomnia, unspecified: Secondary | ICD-10-CM

## 2021-10-11 DIAGNOSIS — F4312 Post-traumatic stress disorder, chronic: Secondary | ICD-10-CM | POA: Diagnosis not present

## 2021-10-11 MED ORDER — CLONAZEPAM 1 MG PO TABS: ORAL_TABLET | ORAL | 2 refills | Status: DC

## 2021-10-11 MED ORDER — CARBAMAZEPINE ER 300 MG PO CP12: 600.0000 mg | ORAL_CAPSULE | Freq: Every day | ORAL | 0 refills | Status: DC

## 2021-10-11 MED ORDER — REXULTI 3 MG PO TABS: 3.0000 mg | ORAL_TABLET | Freq: Every day | ORAL | 1 refills | Status: DC

## 2021-10-11 MED ORDER — AUVELITY 45-105 MG PO TBCR: EXTENDED_RELEASE_TABLET | ORAL | 2 refills | Status: DC

## 2021-10-11 NOTE — Progress Notes (Addendum)
Diana Ayers [id number] January 25, 1960 61 y.o.  Virtual Visit via Video Note  I connected with pt @ on 10/11/21 at  1:15 PM EDT by a video enabled telemedicine application and verified that I am speaking with the correct person using two identifiers.   I discussed the limitations of evaluation and management by telemedicine and the availability of in person appointments. The patient expressed understanding and agreed to proceed.  I discussed the assessment and treatment plan with the patient. The patient was provided an opportunity to ask questions and all were answered. The patient agreed with the plan and demonstrated an understanding of the instructions.   The patient was advised to call back or seek an in-person evaluation if the symptoms worsen or if the condition fails to improve as anticipated.  I provided 25 minutes of non-face-to-face time during this encounter.  The patient was located at home.  The provider was located at [place].   Thayer Headings, PMHNP   Subjective:   Patient ID:  Diana Ayers is a 61 y.o. (DOB 1960-03-13) female.  Chief Complaint:  Chief Complaint  Patient presents with   Depression    HPI Diana Ayers presents for follow-up of anxiety, depression, and insomnia. She reports, "I'm doing slowly better." She reports that she has been dx'd with gastritis and GI work-up was otherwise negative. She reports that gastritis is improving.She reports that she continues to notice depression. She reports that her energy and motivation have been low. She reports that increase in Rexulti from 1 mg to 2 mg may have helped some at that time. She reports that she has diminished interest in her usual activities and has not been enjoying things as much.   She reports that she is having difficulty with sleep initiation. She reports some possible anxiety in response to remodeling. Occ catastrophic thoughts. She reports that she is not eating  much throughout the day and is avoiding foods and caffeine due to GI s/s. Some increased difficulty with concentration. Denies SI.   She reports that remodeling should be mostly completed today.   She is involved with a couple of Bible studies and this is helping getting her out of the house. Walking with a friend on occasion.   Past Psychiatric Medication Trials: Klonopin- Reports that she was once on 6 mg total daily dose and has decreased to 3 mg. Reports Klonopin has been helpful for anxiety.  Trazodone- Effective. Has some excessive daytime somnolence. Latuda Rexulti Effexor XR- Has been helpful for mood and anxiety.  Prozac Paxil Zoloft Wellbutrin Carbamazepine XR- Has been helpful for episodic agitation/irritability/aggression Depakote- Helpful  Nortriptyline  Review of Systems:  Review of Systems  Musculoskeletal:  Negative for gait problem.  Neurological:  Negative for tremors.  Psychiatric/Behavioral:         Please refer to HPI    Medications: I have reviewed the patient's current medications.  Current Outpatient Medications  Medication Sig Dispense Refill   Dextromethorphan-buPROPion ER (AUVELITY) 45-105 MG TBCR Take 1 tablet daily for 3 days, then increase to 1 tablet twice daily 60 tablet 2   L-Methylfolate 15 MG TABS Take 1 tablet (15 mg total) by mouth daily. 30 tablet 5   loratadine (CLARITIN) 10 MG tablet Take 10 mg by mouth daily as needed.     Melatonin 3 MG CAPS Take by mouth.     ondansetron (ZOFRAN) 4 MG tablet 1 tablet     Probiotic Product (PROBIOTIC PO) Take by mouth.  rosuvastatin (CRESTOR) 20 MG tablet Take 20 mg by mouth at bedtime.     venlafaxine XR (EFFEXOR XR) 150 MG 24 hr capsule Take 2 capsules (300 mg total) by mouth daily with breakfast. 180 capsule 1   Vitamin D, Ergocalciferol, (DRISDOL) 1.25 MG (50000 UNIT) CAPS capsule Take 1 capsule (50,000 Units total) by mouth once a week. 12 capsule 1   acetic acid-hydrocortisone (VOSOL-HC) OTIC  solution Place 4 drops into the left ear 3 times daily.     Brexpiprazole (REXULTI) 3 MG TABS Take 1 tablet (3 mg total) by mouth daily. 30 tablet 1   carbamazepine (CARBATROL) 300 MG 12 hr capsule Take 2 capsules (600 mg total) by mouth at bedtime. 180 capsule 0   cholecalciferol (VITAMIN D3) 25 MCG (1000 UNIT) tablet Take 2,000 Units by mouth daily. (Patient not taking: Reported on 08/13/2021)     [START ON 11/12/2021] clonazePAM (KLONOPIN) 1 MG tablet TAKE THREE TABLETS BY MOUTH EVERY NIGHT AT BEDTIME AND TAKE ONE TO TWO TABLETS BY MOUTH AS NEEDED FOR ANXIETY **MUST LAST 30 DAYS** 120 tablet 2   diclofenac Sodium (VOLTAREN) 1 % GEL Voltaren 1 % topical gel     meloxicam (MOBIC) 15 MG tablet TAKE ONE TABLET BY MOUTH DAILY (Patient not taking: Reported on 08/13/2021) 30 tablet 3   Multiple Vitamins-Minerals (MULTIVITAMIN GUMMIES ADULT PO) Take by mouth. (Patient not taking: Reported on 08/13/2021)     pantoprazole (PROTONIX) 40 MG tablet Take 40 mg by mouth daily. (Patient not taking: Reported on 10/11/2021)     SUMAtriptan (IMITREX) 100 MG tablet Take 1 tablet (100 mg total) by mouth once as needed for migraine or headache. May repeat in 2 hours if headache persists or recurs. 10 tablet 1   traZODone (DESYREL) 100 MG tablet Take 2-3 tabs po QHS (Patient taking differently: 150 mg. Take 2-3 tabs po QHS) 90 tablet 5   vitamin C (ASCORBIC ACID) 250 MG tablet Take 250 mg by mouth daily. (Patient not taking: Reported on 10/11/2021)     No current facility-administered medications for this visit.    Medication Side Effects: Other: teeth chattering on occasion since increase in Rexulti from 2 mg to 3 mg  Allergies:  Allergies  Allergen Reactions   Onion Other (See Comments)   Tape Other (See Comments)    RASH, PT ALSO STATES RASH WITH COBAN    Past Medical History:  Diagnosis Date   Anxiety    Depression    FHx: malignant neoplasm of breast in first degree relative 07/23/2011   Hyperlipidemia     PTSD (post-traumatic stress disorder)    Thyroid disease     Family History  Problem Relation Age of Onset   Breast cancer Mother 60       BRCA/BART negative   Lung cancer Mother 99       smoker   Alcohol abuse Mother    Anemia Mother    Osteoporosis Mother    Breast cancer Sister 69   Melanoma Sister 73   Osteoporosis Sister    Dementia Father    Breast cancer Maternal Aunt 33   Breast cancer Sister 87       BRCA-/no BART   Lung cancer Maternal Uncle        smoker   Lung cancer Maternal Uncle        smoker   Heart attack Maternal Aunt    Schizophrenia Maternal Aunt    Schizophrenia Maternal Aunt    Schizophrenia Maternal  Uncle     Social History   Socioeconomic History   Marital status: Single    Spouse name: Not on file   Number of children: 0   Years of education: Not on file   Highest education level: Bachelor's degree (e.g., BA, AB, BS)  Occupational History   Not on file  Tobacco Use   Smoking status: Never   Smokeless tobacco: Never  Vaping Use   Vaping Use: Never used  Substance and Sexual Activity   Alcohol use: No   Drug use: No   Sexual activity: Not Currently  Other Topics Concern   Not on file  Social History Narrative   Not on file   Social Determinants of Health   Financial Resource Strain: Low Risk  (07/16/2017)   Overall Financial Resource Strain (CARDIA)    Difficulty of Paying Living Expenses: Not hard at all  Food Insecurity: No Food Insecurity (07/16/2017)   Hunger Vital Sign    Worried About Running Out of Food in the Last Year: Never true    Ran Out of Food in the Last Year: Never true  Transportation Needs: No Transportation Needs (07/16/2017)   PRAPARE - Hydrologist (Medical): No    Lack of Transportation (Non-Medical): No  Physical Activity: Inactive (07/16/2017)   Exercise Vital Sign    Days of Exercise per Week: 0 days    Minutes of Exercise per Session: 0 min  Stress: Stress Concern Present  (07/16/2017)   [place]    Feeling of Stress : Rather much  Social Connections: Somewhat Isolated (07/16/2017)   Social Connection and Isolation Panel [NHANES]    Frequency of Communication with Friends and Family: More than three times a week    Frequency of Social Gatherings with Friends and Family: Twice a week    Attends Religious Services: More than 4 times per year    Active Member of [employer] or Organizations: No    Attends Archivist Meetings: Never    Marital Status: Never married  Intimate Partner Violence: Not At Risk (07/16/2017)   Humiliation, Afraid, Rape, and Kick questionnaire    Fear of Current or Ex-Partner: No    Emotionally Abused: No    Physically Abused: No    Sexually Abused: No    Past Medical History, Surgical history, Social history, and Family history were reviewed and updated as appropriate.   Please see review of systems for further details on the patient's review from today.   Objective:   Physical Exam:  There were no vitals taken for this visit.  Physical Exam Neurological:     Mental Status: She is alert and oriented to person, place, and time.     Cranial Nerves: No dysarthria.  Psychiatric:        Attention and Perception: Attention and perception normal.        Mood and Affect: Mood is depressed.        Speech: Speech normal.        Behavior: Behavior is cooperative.        Thought Content: Thought content normal. Thought content is not paranoid or delusional. Thought content does not include homicidal or suicidal ideation. Thought content does not include homicidal or suicidal plan.        Cognition and Memory: Cognition and memory normal.        Judgment: Judgment normal.     Comments: Insight intact Some  anxiety     Lab Review:     Component Value Date/Time   NA 138 11/21/2019 1205   NA 140 07/20/2008 1030   K 4.3 11/21/2019 1205   K 3.9 07/20/2008 1030    CL 101 11/21/2019 1205   CL 101 07/20/2008 1030   CO2 30 11/21/2019 1205   CO2 30 07/20/2008 1030   GLUCOSE 98 11/21/2019 1205   GLUCOSE 106 07/20/2008 1030   BUN 11 11/21/2019 1205   BUN 13 07/20/2008 1030   CREATININE 0.65 11/21/2019 1205   CALCIUM 9.9 11/21/2019 1205   CALCIUM 9.2 07/20/2008 1030   PROT 7.0 11/21/2019 1205   PROT 7.5 07/20/2008 1030   ALBUMIN 4.3 07/23/2011 1427   ALBUMIN 3.7 07/20/2008 1030   AST 16 11/21/2019 1205   AST 22 07/20/2008 1030   ALT 14 11/21/2019 1205   ALT 22 07/20/2008 1030   ALKPHOS 55 07/23/2011 1427   ALKPHOS 67 07/20/2008 1030   BILITOT 0.4 11/21/2019 1205   BILITOT 0.50 07/20/2008 1030       Component Value Date/Time   WBC 5.5 11/21/2019 1205   RBC 4.58 11/21/2019 1205   HGB 14.6 11/21/2019 1205   HGB 13.3 07/23/2011 1427   HCT 42.8 11/21/2019 1205   HCT 40.8 07/23/2011 1427   PLT 210 11/21/2019 1205   PLT 196 07/23/2011 1427   MCV 93.4 11/21/2019 1205   MCV 95.0 07/23/2011 1427   MCH 31.9 11/21/2019 1205   MCHC 34.1 11/21/2019 1205   RDW 11.8 11/21/2019 1205   RDW 12.4 07/23/2011 1427   LYMPHSABS 2.3 07/23/2011 1427   MONOABS 0.5 07/23/2011 1427   EOSABS 0.2 07/23/2011 1427   EOSABS 0.2 07/20/2008 1030   BASOSABS 0.1 07/23/2011 1427    No results found for: "POCLITH", "LITHIUM"   Lab Results  Component Value Date   CBMZ 8.4 11/21/2019     .res Assessment: Plan:    Pt seen for 25 minutes and time spent discussing recent depressive s/s and response to increase in Rexulti to 3 mg daily. Discussed that it may be too soon to fully assess response to increase in [place]. She reports that she may have noticed a very slight improvement in depressive s/s and that she has also noticed some occ teeth chattering. Discussed that this can be a side effects of higher doses of Rexulti. Pt reports that she would like to continue Rexulti 3 mg at this time and will notify provider if this side effects worsens. Discussed potential  benefits, risks, and side effects of Auvelity. Will start Auvelity 45-105 mg one tablet daily for 3 days, then increase Auvelity to one tablet twice daily for depression. She requests for script to be sent to her pharmacy and discussed how to download copay savings card from https://www.martinez.info/. Discussed that samples may be available if needed.  Continue Klonopin for anxiety and insomnia.  Continue Carbamazepine for mood symptoms.  Continue Trazodone 150 mg po QHS for insomnia.  Continue Effexor XR 300 mg daily for anxiety and depression.  Discussed considering medication reductions in the future when depression improves to minimize polypharmacy.  Recommend continuing therapy with [name], [facility].  Pt to follow-up with this provider in 4-6 weeks or sooner if clinically indicated.  Patient advised to contact office with any questions, adverse effects, or acute worsening in signs and symptoms.   Wallis was seen today for depression.  Diagnoses and all orders for this visit:  Severe episode of recurrent major depressive disorder,  without psychotic features ([place]) -     Dextromethorphan-buPROPion ER (AUVELITY) 45-105 MG TBCR; Take 1 tablet daily for 3 days, then increase to 1 tablet twice daily -     Brexpiprazole (REXULTI) 3 MG TABS; Take 1 tablet (3 mg total) by mouth daily. -     carbamazepine (CARBATROL) 300 MG 12 hr capsule; Take 2 capsules (600 mg total) by mouth at bedtime.  Chronic post-traumatic stress disorder (PTSD) -     clonazePAM (KLONOPIN) 1 MG tablet; TAKE THREE TABLETS BY MOUTH EVERY NIGHT AT BEDTIME AND TAKE ONE TO TWO TABLETS BY MOUTH AS NEEDED FOR ANXIETY **MUST LAST 30 DAYS**  Insomnia, unspecified type -     clonazePAM (KLONOPIN) 1 MG tablet; TAKE THREE TABLETS BY MOUTH EVERY NIGHT AT BEDTIME AND TAKE ONE TO TWO TABLETS BY MOUTH AS NEEDED FOR ANXIETY **MUST LAST 30 DAYS**     Please see After Visit Summary for patient specific instructions.  Future Appointments  Date  Time Provider [place]  10/15/2021 10:00 AM [name], [facility] CP-CP None  10/29/2021 10:00 AM [name], [facility] CP-CP None  11/12/2021 11:00 AM [name], [facility] CP-CP None  11/26/2021 12:00 PM [name], [facility] CP-CP None  12/10/2021 11:00 AM [name], [facility] CP-CP None  12/25/2021 10:00 AM [name], [facility] CP-CP None    No orders of the defined types were placed in this encounter.     -------------------------------

## 2021-10-15 ENCOUNTER — Ambulatory Visit (INDEPENDENT_AMBULATORY_CARE_PROVIDER_SITE_OTHER): Payer: 59 | Admitting: Psychiatry

## 2021-10-15 DIAGNOSIS — F330 Major depressive disorder, recurrent, mild: Secondary | ICD-10-CM

## 2021-10-15 NOTE — Progress Notes (Signed)
[facility] Counselor/Therapist Progress Note  Patient ID: Diana Ayers, MRN: [id number],    Date: 10/15/2021  Time Spent: 59 minutes start time 10:01 AM end time 11:00 AM Virtual Visit via Video Note Connected with patient by a telemedicine/telehealth application, with their informed consent, and verified patient privacy and that I am speaking with the correct person using two identifiers. I discussed the limitations, risks, security and privacy concerns of performing psychotherapy and the availability of in person appointments. I also discussed with the patient that there may be a patient responsible charge related to this service. The patient expressed understanding and agreed to proceed. I discussed the treatment planning with the patient. The patient was provided an opportunity to ask questions and all were answered. The patient agreed with the plan and demonstrated an understanding of the instructions. The patient was advised to call  our office if  symptoms worsen or feel they are in a crisis state and need immediate contact.   Therapist Location: office Patient Location: home    Treatment Type: Individual Therapy  Reported Symptoms: depression, fatigue, low motivation, nightmares, sleep issues, rumination  Mental Status Exam:  Appearance:   Casual     Behavior:  Appropriate  Motor:  Normal  Speech/Language:   Normal Rate  Affect:  Labile  Mood:  labile  Thought process:  normal  Thought content:    WNL  Sensory/Perceptual disturbances:    WNL  Orientation:  oriented to person, place, time/date, and situation  Attention:  Good  Concentration:  Good  Memory:  WNL  Fund of knowledge:   Good  Insight:    Good  Judgment:   Good  Impulse Control:  Good   Risk Assessment: Danger to Self:  No Self-injurious Behavior: No Danger to Others: No Duty to Warn:no Physical Aggression / Violence:No  Access to Firearms a concern: No  Gang Involvement:No    Subjective: Met with patient via virtual session.  She shared she is having a hard time functioning in the morning. She stated that she feels some of the issue is due to not having coffee due to gastritis. Discussed other ways to get nutrients to help her get more energetic. She shared she is still going through the remodel at her house but she stated doing it has taken a toll on her so even though she is happy she isn't excited about it due to her fatigue.  She went on to share she was able to go to a Bible study twice but has not gone to the other one that she joined at all.  She was able to be honest about her struggles in her group. She is hoping that she can go to the beach for a few days. She went on to share that she has missed her mother's Rudene Anda and her nephew's Rudene Anda which was not her norm. Discussed trying to find positive even when it is hard.  Also, encouraged her to try and do more even when she feels the fatigue, dicussed adding something to her day a little at a time.  She was reminded that she does better when she is on a good routine she agreed to consider volunteering or trying to get out a little more.  Encouraged her to try and change her perspective and recognize she has to work within her current situation and take time to build back up her stamina. Developed CBT filters to help her get herself built back up and going.  Interventions: Cognitive Behavioral Therapy, Solution-Oriented/Positive Psychology, and Insight-Oriented  Diagnosis:   ICD-10-CM   1. MDD (major depressive disorder), recurrent episode, mild ([place])  F33.0       Plan: Patient is to use CBT and coping skills to decrease depression symptoms.  Patient is to get outside regularly, start fishing again, find ways to have laughter in her day, and research whole 30 diet.  Patient is to continue participating in the reboot program.  Patient is to participate in her church Bible study and work on developing more  relationships outside of the group time.  Patient is to exercise to release negative emotions appropriately.  Patient is to work on information about the neuro cycle by Dr. Tawanna Solo.  Patient is to try to get in a habit of studying her reboot information every day to prepare for leaving the groups.  Patient is to take medication as directed.  Patient is to work with other medical providers on medical issues that are concerning for her.  She is also to research different diets that could be helpful for her. Long-term goal: Elevate mood and show evidence of usual energy activities and socialization level Short-term goal: Identify and replace depressive thinking that leads to depressive feelings and actions  [name], [facility]

## 2021-10-29 ENCOUNTER — Ambulatory Visit (INDEPENDENT_AMBULATORY_CARE_PROVIDER_SITE_OTHER): Payer: 59 | Admitting: Psychiatry

## 2021-10-29 DIAGNOSIS — F330 Major depressive disorder, recurrent, mild: Secondary | ICD-10-CM | POA: Diagnosis not present

## 2021-10-29 NOTE — Progress Notes (Signed)
[facility] Counselor/Therapist Progress Note  Patient ID: NEALA MIGGINS, MRN: [id number],    Date: 10/29/2021  Time Spent: 57 Start time 10:07 AM end time 10:57 AM Virtual Visit via Video Note Connected with patient by a telemedicine/telehealth application, with their informed consent, and verified patient privacy and that I am speaking with the correct person using two identifiers. I discussed the limitations, risks, security and privacy concerns of performing psychotherapy and the availability of in person appointments. I also discussed with the patient that there may be a patient responsible charge related to this service. The patient expressed understanding and agreed to proceed. I discussed the treatment planning with the patient. The patient was provided an opportunity to ask questions and all were answered. The patient agreed with the plan and demonstrated an understanding of the instructions. The patient was advised to call  our office if  symptoms worsen or feel they are in a crisis state and need immediate contact.   Therapist Location: office Patient Location: home    Treatment Type: Individual Therapy  Reported Symptoms: low motivation, sleep issues, fatigue, sadness, anxiety, health issues  Mental Status Exam:  Appearance:   Casual     Behavior:  Appropriate  Motor:  Normal  Speech/Language:   Normal Rate  Affect:  Appropriate  Mood:  labile  Thought process:  normal  Thought content:    WNL  Sensory/Perceptual disturbances:    WNL  Orientation:  oriented to person, place, time/date, and situation  Attention:  Good  Concentration:  Good  Memory:  WNL  Fund of knowledge:   Good  Insight:    Good  Judgment:   Good  Impulse Control:  Good   Risk Assessment: Danger to Self:  No Self-injurious Behavior: No Danger to Others: No Duty to Warn:no Physical Aggression / Violence:No  Access to Firearms a concern: No  Gang Involvement:No   Subjective: Met  with patient via virtual session. She shared she still doesn't have the energy to get going and get into the office.  She shared that she is just not sleeping well and isn't wanting to do anything. She shared that she is doing some things through the day but she isn't herself since her gastritis.  She stated that there are still things she can't eat or drink due to the issue. She reported that [place] but [place] wanting to go .  Encouraged her to realize she needs to get out and try even though she doesn't want to. Discussed ways for her to deal with the situation and how to start making baby steps in the direction.  Discussed importance of recognizing the truth and reminding herself of what she needs to do even when she doesn't want to do.  Reminded her of the importance of her thoughts and focusing on what she can do.  She stated she is still easily triggered and recognizing she has to limit the information about traumas. Affirmed that it is okay for her to try and protect her mind. Patient also discussed situation with she and her roommate going to the beach and her sisters thoughts about going or not going with them.  Patient was reminded that she can let them do what they needed to and be okay either way.  She was able to get perspective and felt better.  Interventions: Cognitive Behavioral Therapy and Solution-Oriented/Positive Psychology  Diagnosis:   ICD-10-CM   1. MDD (major  depressive disorder), recurrent episode, mild ([place])  F33.0       Plan: Patient is to use CBT and coping skills to decrease depression symptoms.  Patient is to get outside regularly, start fishing again, find ways to have laughter in her day, and research whole 30 diet.  Patient is to continue participating in the reboot program.  Patient is to participate in her church Bible study and work on developing more relationships outside of the group time.  Patient is to exercise to release negative  emotions appropriately.  Patient is to work on information about the neuro cycle by Dr. Tawanna Solo.  Patient is to try to get in a habit of studying her reboot information every day to prepare for leaving the groups.  Patient is to take medication as directed.  Patient is to work with other medical providers on medical issues that are concerning for her.  She is also to research different diets that could be helpful for her. Long-term goal: Elevate mood and show evidence of usual energy activities and socialization level Short-term goal: Identify and replace depressive thinking that leads to depressive feelings and actions  [name], [facility]

## 2021-11-12 ENCOUNTER — Ambulatory Visit (INDEPENDENT_AMBULATORY_CARE_PROVIDER_SITE_OTHER): Payer: 59 | Admitting: Psychiatry

## 2021-11-12 DIAGNOSIS — F330 Major depressive disorder, recurrent, mild: Secondary | ICD-10-CM | POA: Diagnosis not present

## 2021-11-12 NOTE — Progress Notes (Signed)
[facility] Counselor/Therapist Progress Note  Patient ID: Diana Ayers, MRN: [id number],    Date: 11/12/2021  Time Spent: 50 minutes start time 11:03 AM end time 11:53 PM Virtual Visit via Video Note Connected with patient by a telemedicine/telehealth application, with their informed consent, and verified patient privacy and that I am speaking with the correct person using two identifiers. I discussed the limitations, risks, security and privacy concerns of performing psychotherapy and the availability of in person appointments. I also discussed with the patient that there may be a patient responsible charge related to this service. The patient expressed understanding and agreed to proceed. I discussed the treatment planning with the patient. The patient was provided an opportunity to ask questions and all were answered. The patient agreed with the plan and demonstrated an understanding of the instructions. The patient was advised to call  our office if  symptoms worsen or feel they are in a crisis state and need immediate contact.   Therapist Location: office Patient Location: home    Treatment Type: Individual Therapy  Reported Symptoms: fatigue anxiety, sleep issues, depression, rumination, focusing issues, health issues  Mental Status Exam:  Appearance:   Well Groomed     Behavior:  Appropriate  Motor:  Normal  Speech/Language:   Normal Rate  Affect:  Appropriate  Mood:  normal  Thought process:  normal  Thought content:    WNL  Sensory/Perceptual disturbances:    WNL  Orientation:  oriented to person, place, time/date, and situation  Attention:  Good  Concentration:  Good  Memory:  WNL  Fund of knowledge:   Good  Insight:    Good  Judgment:   Good  Impulse Control:  Good   Risk Assessment: Danger to Self:  No Self-injurious Behavior: No Danger to Others: No Duty to Warn:no Physical Aggression / Violence:No  Access to Firearms a concern: No  Gang  Involvement:No   Subjective: Met with patient via virtual session.  She shared that she was able to get to go on her beach trip and that was a nice break and relaxing. She stated that helped her mood to just get away and that felt good to her. Her gastritis is starting to get better and she has been able to drink 1 cup of coffee and that is helping her as well. She was able to start walking which is helping as well.  She is not watching the news to try and prevent triggers.She shared that she is still having lots of issues with her remodel and she is not sure how to handle some of the issues.  Discussed the importance of taking things 1 step at a time and maintaining some form of perspective.  Patient was able to recognize she does not need to get everything completed at a certain time and that it is worth it for her just to take her time and do what she has to get completed.  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  Encouraged her to trust herself to realize she does not need to feel pressured to go in any certain direction.  Patient was encouraged to recognize that with the change in seasons her depression typically increases this time of the year.  I had patient think through what she needed to do to make sure that does not happen.  Patient agreed to look into getting a temporary job or volunteering now that she is starting to feel  better so that she can keep some normal routine which seems to help her maintain her mood more appropriately.  Interventions: Solution-Oriented/Positive Psychology and Insight-Oriented  Diagnosis:   ICD-10-CM   1. MDD (major depressive disorder), recurrent episode, mild ([place])  F33.0       Plan: Patient is to use CBT and coping skills to decrease depression symptoms.  Patient is to get outside regularly, start fishing again, find ways to have laughter in her day, and research whole 30 diet.  Patient is to start looking into a temporary job  situation or volunteering.  Patient is to continue participating in the reboot program.  Patient is to participate in her church Bible study and work on developing more relationships outside of the group time.  Patient is to exercise to release negative emotions appropriately.  Patient is to work on information about the neuro cycle by Dr. Tawanna Solo.  Patient is to try to get in a habit of studying her reboot information every day to prepare for leaving the groups.  Patient is to take medication as directed.  Patient is to work with other medical providers on medical issues that are concerning for her.  She is also to research different diets that could be helpful for her. Long-term goal: Elevate mood and show evidence of usual energy activities and socialization level Short-term goal: Identify and replace depressive thinking that leads to depressive feelings and actions  [name], [facility]

## 2021-11-26 ENCOUNTER — Ambulatory Visit (INDEPENDENT_AMBULATORY_CARE_PROVIDER_SITE_OTHER): Payer: 59 | Admitting: Psychiatry

## 2021-11-26 DIAGNOSIS — F330 Major depressive disorder, recurrent, mild: Secondary | ICD-10-CM

## 2021-11-26 NOTE — Progress Notes (Signed)
[facility] Counselor/Therapist Progress Note  Patient ID: Diana Ayers, MRN: [id number],    Date: 11/26/2021  Time Spent: 50 minutes start time 12:04 PM end time 12:54 PM Virtual Visit via video note Connected with patient by a telemedicine/telehealth application, with their informed consent, and verified patient privacy and that I am speaking with the correct person using two identifiers. I discussed the limitations, risks, security and privacy concerns of performing psychotherapy and the availability of in person appointments. I also discussed with the patient that there may be a patient responsible charge related to this service. The patient expressed understanding and agreed to proceed. I discussed the treatment planning with the patient. The patient was provided an opportunity to ask questions and all were answered. The patient agreed with the plan and demonstrated an understanding of the instructions. The patient was advised to call  our office if  symptoms worsen or feel they are in a crisis state and need immediate contact.   Therapist Location: office Patient Location: home    Treatment Type: Individual Therapy  Reported Symptoms: sadness, triggered responses, health issues, anxiety, sleep issues, fatigue, intense dreams  Mental Status Exam:  Appearance:   Casual and Neat     Behavior:  Appropriate  Motor:  Normal  Speech/Language:   Normal Rate  Affect:  Appropriate  Mood:  normal  Thought process:  normal  Thought content:    WNL  Sensory/Perceptual disturbances:    WNL  Orientation:  oriented to person, place, time/date, and situation  Attention:  Good  Concentration:  Good  Memory:  WNL  Fund of knowledge:   Good  Insight:    Good  Judgment:   Good  Impulse Control:  Good   Risk Assessment: Danger to Self:  No Self-injurious Behavior: No Danger to Others: No Duty to Warn:no Physical Aggression / Violence:No  Access to Firearms a concern: No   Gang Involvement:No   Subjective: Met with patient via virtual session.  She shared her gastritis is getting better and her mood is improving. She shared she is trying to work on putting the right things in her brain and it has helped her.  She shared she did learn recently she has trust issues and recognizes that comes from a history of being lied to in her past.  She is also working to keep the seasonal depression away.  She explained that her nephew and his wife are hosting Thanksgiving and that is a good thing. She shared that she and 3 of her friends went to a new restaurant and her mother, sisters and niece showed up and it was a good time. Her mother came back with her and stayed a few days which was progress as well. She has been walking and is thinking about going fishing. She is working on having gratitude. Discussed the intense dreams that she has been having and how there is a theme of feeling responsibility.  She shared she is concerned about her sister and wondered if she is feeling responsible for her.  Discussed how to talk with her about what she is learning and how she is growing.  Patient was encouraged to continue doing the things that she knows is helping her to feel better and staying focused on the things she can control fix and change.  Interventions: Cognitive Behavioral Therapy and Solution-Oriented/Positive Psychology  Diagnosis:   ICD-10-CM   1. MDD (major depressive disorder), recurrent episode, mild ([place])  F33.0  Plan: Patient is to use CBT and coping skills to decrease depression symptoms.  Patient is to get outside regularly, start fishing again, find ways to have laughter in her day, and research whole 30 diet.  Patient is to start looking into a temporary job situation or volunteering.  Patient is to continue participating in the reboot program.  Patient is to participate in her church Bible study and work on developing more relationships outside of the group  time.  Patient is to exercise to release negative emotions appropriately.  Patient is to work on information about the neuro cycle by Dr. Tawanna Solo.  Patient is to try to get in a habit of studying her reboot information every day to prepare for leaving the groups.  Patient is to take medication as directed.  Patient is to work with other medical providers on medical issues that are concerning for her.  She is also to research different diets that could be helpful for her. Long-term goal: Elevate mood and show evidence of usual energy activities and socialization level Short-term goal: Identify and replace depressive thinking that leads to depressive feelings and actions  [name], [facility]

## 2021-12-10 ENCOUNTER — Ambulatory Visit: Payer: 59 | Admitting: Psychiatry

## 2021-12-24 ENCOUNTER — Other Ambulatory Visit: Payer: Self-pay | Admitting: Psychiatry

## 2021-12-24 DIAGNOSIS — F332 Major depressive disorder, recurrent severe without psychotic features: Secondary | ICD-10-CM

## 2021-12-25 ENCOUNTER — Ambulatory Visit: Payer: 59 | Admitting: Psychiatry

## 2021-12-25 ENCOUNTER — Encounter: Payer: Self-pay | Admitting: Psychiatry

## 2021-12-25 DIAGNOSIS — F330 Major depressive disorder, recurrent, mild: Secondary | ICD-10-CM

## 2021-12-25 DIAGNOSIS — F3341 Major depressive disorder, recurrent, in partial remission: Secondary | ICD-10-CM | POA: Diagnosis not present

## 2021-12-25 DIAGNOSIS — F32A Depression, unspecified: Secondary | ICD-10-CM

## 2021-12-25 DIAGNOSIS — F4312 Post-traumatic stress disorder, chronic: Secondary | ICD-10-CM | POA: Diagnosis not present

## 2021-12-25 DIAGNOSIS — F332 Major depressive disorder, recurrent severe without psychotic features: Secondary | ICD-10-CM

## 2021-12-25 DIAGNOSIS — G4700 Insomnia, unspecified: Secondary | ICD-10-CM | POA: Diagnosis not present

## 2021-12-25 MED ORDER — VENLAFAXINE HCL ER 150 MG PO CP24: 300.0000 mg | ORAL_CAPSULE | Freq: Every day | ORAL | 1 refills | Status: DC

## 2021-12-25 MED ORDER — REXULTI 3 MG PO TABS: 1.0000 | ORAL_TABLET | Freq: Every day | ORAL | 2 refills | Status: DC

## 2021-12-25 MED ORDER — AUVELITY 45-105 MG PO TBCR: 1.0000 | EXTENDED_RELEASE_TABLET | Freq: Two times a day (BID) | ORAL | 1 refills | Status: DC

## 2021-12-25 MED ORDER — CLONAZEPAM 1 MG PO TABS: ORAL_TABLET | ORAL | 2 refills | Status: DC

## 2021-12-25 MED ORDER — TRAZODONE HCL 100 MG PO TABS: ORAL_TABLET | ORAL | 1 refills | Status: DC

## 2021-12-25 MED ORDER — CARBAMAZEPINE ER 300 MG PO CP12: 600.0000 mg | ORAL_CAPSULE | Freq: Every day | ORAL | 0 refills | Status: DC

## 2021-12-25 NOTE — Progress Notes (Signed)
Diana Ayers [id number] 27-Nov-1960 61 y.o.  Subjective:   Patient ID:  Diana Ayers is a 61 y.o. (DOB 1960/03/11) female.  Chief Complaint: No chief complaint on file.   HPI Diana Ayers presents to the office today for follow-up of depression, anxiety, and insomnia.   She reports that her GI s/s resolved- "It's good to finally feel better." She reports that her depression has improved. She reports that she is not experiencing seasonal depression like she typically would this time of year. She reports occasional times where her mood and energy are lower. "I have anxiety all the time... it has lessened" after completing most of the kitchen remodeling project. Has some anxiety in response to upcoming holidays. Denies any recent panic attacks. 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 She reports some intrusive memories about traumatic events related to Christmas. She has some nightmares related to trauma. She reports that she falls asleep easily. She reports that sleep is ok overall. Appetite has improved and she reports some recent weight gain. Energy and motivation have improved. She reports some difficulty with focus and sustaining attention. She reports that others will occasionally mention events from the past that she does not recall. Denies SI.   She reports that Thanksgiving was "nice." Younger sister has started inviting her, her mother, and other sister to Thanksgiving the last 2 years. Had Thanksgiving with nephew and his family Wednesday evening. Mother's health has been better this year and not been hospitalized after repeatedly being hospitalized the last 2 years. Has grown nieces and nephews.   Taking Klonopin 2.5 mg at bedtime and 1 mg on occasion as needed during the daytime.   Klonopin last filled 11/26/21.   Past Psychiatric Medication Trials: Klonopin- Reports that she was once on 6 mg total  daily dose and has decreased to 3 mg. Reports Klonopin has been helpful for anxiety.  Trazodone- Effective. Has some excessive daytime somnolence. Latuda Rexulti Effexor XR- Has been helpful for mood and anxiety.  Prozac Paxil Zoloft Wellbutrin Carbamazepine XR- Has been helpful for episodic agitation/irritability/aggression Depakote- Helpful  Nortriptyline    AIMS    Flowsheet Row Video Visit from 08/13/2021 in [place] Visit from 03/07/2021 in [place] Total Score 0 0      PHQ2-9    Flowsheet Row [facility]2014 [facility]16/2014 [facility]  PHQ-2 Total Score 0 6 3        Review of Systems:  Review of Systems  Gastrointestinal:        Improved GI s/s.  Musculoskeletal:  Negative for gait problem.  Neurological:  Negative for tremors.  Psychiatric/Behavioral:         Please refer to HPI    Medications: I have reviewed the patient's current medications.  Current Outpatient Medications  Medication Sig Dispense Refill  . acetic acid-hydrocortisone (VOSOL-HC) OTIC solution Place 4 drops into the left ear 3 times daily.    . carbamazepine (CARBATROL) 300 MG 12 hr capsule Take 2 capsules (600 mg total) by mouth at bedtime. 180 capsule 0  . cholecalciferol (VITAMIN D3) 25 MCG (1000 UNIT) tablet Take 2,000 Units by mouth daily. (Patient not taking: Reported on 08/13/2021)    . clonazePAM (KLONOPIN) 1 MG tablet TAKE THREE TABLETS BY MOUTH EVERY NIGHT  AT BEDTIME AND TAKE ONE TO TWO TABLETS BY MOUTH AS NEEDED FOR ANXIETY **MUST LAST 30 DAYS** 120 tablet 2  . Dextromethorphan-buPROPion ER (AUVELITY) 45-105 MG TBCR Take 1 tablet daily for 3 days, then increase to 1 tablet twice daily 60 tablet 2  . diclofenac Sodium (VOLTAREN) 1 % GEL Voltaren 1 % topical gel    .  L-Methylfolate 15 MG TABS Take 1 tablet (15 mg total) by mouth daily. 30 tablet 5  . loratadine (CLARITIN) 10 MG tablet Take 10 mg by mouth daily as needed.    . Melatonin 3 MG CAPS Take by mouth.    . meloxicam (MOBIC) 15 MG tablet TAKE ONE TABLET BY MOUTH DAILY (Patient not taking: Reported on 08/13/2021) 30 tablet 3  . Multiple Vitamins-Minerals (MULTIVITAMIN GUMMIES ADULT PO) Take by mouth. (Patient not taking: Reported on 08/13/2021)    . ondansetron (ZOFRAN) 4 MG tablet 1 tablet    . pantoprazole (PROTONIX) 40 MG tablet Take 40 mg by mouth daily. (Patient not taking: Reported on 10/11/2021)    . Probiotic Product (PROBIOTIC PO) Take by mouth.    Marland Kitchen REXULTI 3 MG TABS TAKE 1 TABLET BY MOUTH DAILY 30 tablet 1  . rosuvastatin (CRESTOR) 20 MG tablet Take 20 mg by mouth at bedtime.    . SUMAtriptan (IMITREX) 100 MG tablet Take 1 tablet (100 mg total) by mouth once as needed for migraine or headache. May repeat in 2 hours if headache persists or recurs. 10 tablet 1  . traZODone (DESYREL) 100 MG tablet Take 2-3 tabs po QHS (Patient taking differently: 150 mg. Take 2-3 tabs po QHS) 90 tablet 5  . venlafaxine XR (EFFEXOR XR) 150 MG 24 hr capsule Take 2 capsules (300 mg total) by mouth daily with breakfast. 180 capsule 1  . vitamin C (ASCORBIC ACID) 250 MG tablet Take 250 mg by mouth daily. (Patient not taking: Reported on 10/11/2021)    . Vitamin D, Ergocalciferol, (DRISDOL) 1.25 MG (50000 UNIT) CAPS capsule Take 1 capsule (50,000 Units total) by mouth once a week. 12 capsule 1   No current facility-administered medications for this visit.    Medication Side Effects: Other: Possible teeth chattering  Allergies:  Allergies  Allergen Reactions  . Onion Other (See Comments)  . Tape Other (See Comments)    RASH, PT ALSO STATES RASH WITH COBAN    Past Medical History:  Diagnosis Date  . Anxiety   . Depression   . FHx: malignant neoplasm of breast in first degree relative 07/23/2011  .  Hyperlipidemia   . PTSD (post-traumatic stress disorder)   . Thyroid disease     Past Medical History, Surgical history, Social history, and Family history were reviewed and updated as appropriate.   Please see review of systems for further details on the patient's review from today.   Objective:   Physical Exam:  There were no vitals taken for this visit.  Physical Exam  Lab Review:     Component Value Date/Time   NA 138 11/21/2019 1205   NA 140 07/20/2008 1030   K 4.3 11/21/2019 1205   K 3.9 07/20/2008 1030   CL 101 11/21/2019 1205   CL 101 07/20/2008 1030   CO2 30 11/21/2019 1205   CO2 30 07/20/2008 1030   GLUCOSE 98 11/21/2019 1205   GLUCOSE 106 07/20/2008 1030   BUN 11 11/21/2019 1205   BUN 13 07/20/2008 1030   CREATININE 0.65 11/21/2019 1205   CALCIUM 9.9 11/21/2019 1205  CALCIUM 9.2 07/20/2008 1030   PROT 7.0 11/21/2019 1205   PROT 7.5 07/20/2008 1030   ALBUMIN 4.3 07/23/2011 1427   ALBUMIN 3.7 07/20/2008 1030   AST 16 11/21/2019 1205   AST 22 07/20/2008 1030   ALT 14 11/21/2019 1205   ALT 22 07/20/2008 1030   ALKPHOS 55 07/23/2011 1427   ALKPHOS 67 07/20/2008 1030   BILITOT 0.4 11/21/2019 1205   BILITOT 0.50 07/20/2008 1030       Component Value Date/Time   WBC 5.5 11/21/2019 1205   RBC 4.58 11/21/2019 1205   HGB 14.6 11/21/2019 1205   HGB 13.3 07/23/2011 1427   HCT 42.8 11/21/2019 1205   HCT 40.8 07/23/2011 1427   PLT 210 11/21/2019 1205   PLT 196 07/23/2011 1427   MCV 93.4 11/21/2019 1205   MCV 95.0 07/23/2011 1427   MCH 31.9 11/21/2019 1205   MCHC 34.1 11/21/2019 1205   RDW 11.8 11/21/2019 1205   RDW 12.4 07/23/2011 1427   LYMPHSABS 2.3 07/23/2011 1427   MONOABS 0.5 07/23/2011 1427   EOSABS 0.2 07/23/2011 1427   EOSABS 0.2 07/20/2008 1030   BASOSABS 0.1 07/23/2011 1427    No results found for: "POCLITH", "LITHIUM"   Lab Results  Component Value Date   CBMZ 8.4 11/21/2019     .res Assessment: Plan:    There are no diagnoses  linked to this encounter.   Please see After Visit Summary for patient specific instructions.  Future Appointments  Date Time Provider [place]  12/25/2021 11:30 AM Thayer Headings, PMHNP CP-CP None  02/04/2022  1:00 PM [name], [facility] None  02/18/2022 11:00 AM [name], [facility] CP-CP None  03/10/2022 10:00 AM [name], [facility] CP-CP None  04/02/2022 10:00 AM [name], [facility] CP-CP None    No orders of the defined types were placed in this encounter.   -------------------------------
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
## 2021-12-30 ENCOUNTER — Other Ambulatory Visit: Payer: Self-pay | Admitting: Psychiatry

## 2021-12-30 DIAGNOSIS — F32A Depression, unspecified: Secondary | ICD-10-CM

## 2021-12-30 DIAGNOSIS — F4312 Post-traumatic stress disorder, chronic: Secondary | ICD-10-CM

## 2022-01-02 ENCOUNTER — Telehealth: Payer: Self-pay

## 2022-01-02 NOTE — Telephone Encounter (Signed)
Prior Authorization submitted for Rexulti 3 mg with [employer] [facility], pending response with cover my meds

## 2022-01-03 NOTE — Telephone Encounter (Signed)
Approval received effective through 01/03/2023 with [employer] [facility] for Rexulti 3 mg, [id number]

## 2022-02-04 ENCOUNTER — Ambulatory Visit: Payer: 59 | Admitting: Psychiatry

## 2022-02-18 ENCOUNTER — Ambulatory Visit: Payer: 59 | Admitting: Psychiatry

## 2022-02-18 DIAGNOSIS — F3341 Major depressive disorder, recurrent, in partial remission: Secondary | ICD-10-CM | POA: Diagnosis not present

## 2022-02-18 NOTE — Progress Notes (Signed)
[facility] Counselor/Therapist Progress Note  Patient ID: Diana Ayers, MRN: [id number],    Date: 02/18/2022  Time Spent: 54 minutes start time 11:06 AM end time 12:00 PM  Treatment Type: Individual Therapy  Reported Symptoms: sadness, anxiety, fatigue, triggered responses, low motivation  Mental Status Exam:  Appearance:   Casual     Behavior:  Appropriate  Motor:  Normal  Speech/Language:   Normal Rate  Affect:  Appropriate  Mood:  normal  Thought process:  normal  Thought content:    WNL  Sensory/Perceptual disturbances:    WNL  Orientation:  oriented to person, place, time/date, and situation  Attention:  Good  Concentration:  Good  Memory:  WNL  Fund of knowledge:   Good  Insight:    Good  Judgment:   Good  Impulse Control:  Good   Risk Assessment: Danger to Self:  No Self-injurious Behavior: No Danger to Others: No Duty to Warn:no Physical Aggression / Violence:No  Access to Firearms a concern: No  Gang Involvement:No   Subjective: Patient was present for session. She shared she has been feeling better overall. She shared that [place] went okay her youngest sister is distant and that is hard.  She went to the beach with some friends this weekend and that helped her mood. She shared she is getting some things done and she is enjoying her cats. She is going to a few Bible studies and interacting more with others.She went on to share that she has had some questions about her reactions to her mother, allowed her time to process them and she was able to realize they are normal considering her line of work. Patient was also able to realize she is handling things in a positive manner at this point with her mom.  She also shared issues with her roommate and a friend and the dynamics that are very difficult for her.  Allowed her to think through what would be best for her she was able to recognize keeping her perspective and focusing on the things that she can  control fix and change will help her be able to keep her mood okay.  Patient was also able to recognize she has got to continue focusing on her self-care interacting with others exercising and maintaining perspective with using her filters.  Patient was able to recognize she does not function like everybody else but she does not need to try either.   Interventions: Solution-Oriented/Positive Psychology and Insight-Oriented  Diagnosis:   ICD-10-CM   1. MDD (major depressive disorder), recurrent, in partial remission ([place])  F33.41       Plan:  Patient is to use CBT and coping skills to decrease depression symptoms.  Patient is to continue working on filter to focus on the things that she can control fix and change and recognize that she may not function like others and that is okay.  Patient is to continue participating in the reboot program.  Patient is to participate in her church Bible study and work on developing more relationships outside of the group time.  Patient is to exercise to release negative emotions appropriately.  Patient is to work on information about the neuro cycle by Dr. Tawanna Solo.  Patient is to try to get in a habit of studying her reboot information every day to prepare for leaving the groups.  Patient is to take medication as directed.  Patient is to work with other medical providers on medical issues that  are concerning for her.  She is also to research different diets that could be helpful for her. Long-term goal: Elevate mood and show evidence of usual energy activities and socialization level Short-term goal: Identify and replace depressive thinking that leads to depressive feelings and actions  [name], [facility]

## 2022-03-06 ENCOUNTER — Ambulatory Visit: Payer: 59 | Admitting: Psychiatry

## 2022-03-10 ENCOUNTER — Ambulatory Visit: Payer: 59 | Admitting: Psychiatry

## 2022-03-11 ENCOUNTER — Encounter: Payer: Self-pay | Admitting: Psychiatry

## 2022-03-11 ENCOUNTER — Ambulatory Visit (INDEPENDENT_AMBULATORY_CARE_PROVIDER_SITE_OTHER): Payer: 59 | Admitting: Psychiatry

## 2022-03-11 DIAGNOSIS — G4700 Insomnia, unspecified: Secondary | ICD-10-CM

## 2022-03-11 DIAGNOSIS — F3341 Major depressive disorder, recurrent, in partial remission: Secondary | ICD-10-CM | POA: Diagnosis not present

## 2022-03-11 DIAGNOSIS — F4310 Post-traumatic stress disorder, unspecified: Secondary | ICD-10-CM | POA: Diagnosis not present

## 2022-03-11 DIAGNOSIS — F330 Major depressive disorder, recurrent, mild: Secondary | ICD-10-CM

## 2022-03-11 MED ORDER — BREXPIPRAZOLE 2 MG PO TABS: 2.0000 mg | ORAL_TABLET | Freq: Every day | ORAL | 1 refills | Status: DC

## 2022-03-11 MED ORDER — CARBAMAZEPINE ER 300 MG PO CP12: 600.0000 mg | ORAL_CAPSULE | Freq: Every day | ORAL | 0 refills | Status: DC
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
## 2022-03-31 ENCOUNTER — Ambulatory Visit (INDEPENDENT_AMBULATORY_CARE_PROVIDER_SITE_OTHER): Payer: 59 | Admitting: Psychiatry

## 2022-03-31 DIAGNOSIS — F4310 Post-traumatic stress disorder, unspecified: Secondary | ICD-10-CM | POA: Diagnosis not present

## 2022-03-31 NOTE — Progress Notes (Signed)
[facility] Counselor/Therapist Progress Note  Patient ID: RAJVI LIUZZO, MRN: [id number],    Date: 03/31/2022  Time Spent: 55 minutes start Time 12:04 PM end time 12:59 PM Virtual Visit via Video Note Connected with patient by a telemedicine/telehealth application, with their informed consent, and verified patient privacy and that I am speaking with the correct person using two identifiers. I discussed the limitations, risks, security and privacy concerns of performing psychotherapy and the availability of in person appointments. I also discussed with the patient that there may be a patient responsible charge related to this service. The patient expressed understanding and agreed to proceed. I discussed the treatment planning with the patient. The patient was provided an opportunity to ask questions and all were answered. The patient agreed with the plan and demonstrated an understanding of the instructions. The patient was advised to call  our office if  symptoms worsen or feel they are in a crisis state and need immediate contact.   Therapist Location: office Patient Location: home    Treatment Type: Individual Therapy  Reported Symptoms: sleep issues, sadness, triggered responses, flashbacks  Mental Status Exam:  Appearance:   Well Groomed     Behavior:  Appropriate  Motor:  Normal  Speech/Language:   Normal Rate  Affect:  Appropriate  Mood:  labile  Thought process:  normal  Thought content:    WNL  Sensory/Perceptual disturbances:    WNL  Orientation:  oriented to person, place, time/date, and situation  Attention:  Good  Concentration:  Good  Memory:  WNL  Fund of knowledge:   Good  Insight:    Good  Judgment:   Good  Impulse Control:  Good   Risk Assessment: Danger to Self:  No Self-injurious Behavior: No Danger to Others: No Duty to Warn:no Physical Aggression / Violence:No  Access to Firearms a concern: No  Gang Involvement:No   Subjective: Met  with patient via virtual session. She shared that she is not feeling great so she felt she needed to stay at home. She went on to share that a friend of hers lost her son to an overdose over the weekend in her home and that was triggering for her. She shared that it has made pictures of the cases of overdoses that she witnessed.  Patient was allowed time to process the situation and think through things that she could do for her friend that would not be as triggering and overwhelming for her.  Patient was encouraged to remind herself when memories of people that she had witnessed overdosing surface to take time to be thankful for the life that they lived and pray for their families or others that are involved with those individuals.  Patient was encouraged to use her flashbacks more as reminders of doing something for others rather than allowing the thoughts to ruminate in her head.  Patient asked about working in a group for her trauma from early childhood.  She shared she was concerned that it may be too much for her and she could get flooded with emotion.  Had patient think through back on some of the trauma memories that have been dealt with in treatment.  Patient was able to recognize that those memories are still there but the emotions attached to them are gone and she is able to think of them without being flooded.  Patient was encouraged to recognize that progress and so things have worked in [place] so it is okay for her  to be in a group at without having to feel that anxiety of getting overwhelmed when triggered.  Patient was also encouraged to think through of all the positive changes she has made recently including attending several Bible studies getting out and have outings with friends and entertaining people at her house.  Patient was encouraged to continue the progress she has made over the past several months.  Interventions: Solution-Oriented/Positive Psychology  Diagnosis:   ICD-10-CM   1.  PTSD (post-traumatic stress disorder)  F43.10       Plan: Patient is to use CBT and coping skills to decrease triggered responses. Patient is to participate in her church Bible study and work on developing more relationships outside of the group time.  Patient is to exercise to release negative emotions appropriately.  Patient is to work on information about the neuro cycle by Dr. Tawanna Solo.  Patient is to take medication as directed.  Patient is to work with other medical providers on medical issues that are concerning for her.  She is also to research different diets that could be helpful for her. Long-term goal: Develop and implement coping skills to carry out normal responsibilities and participate constructively in relationships Short-term goal: I participate implement relaxation training as a coping mechanism for tension panic stress anger and anxiety  [name], [facility]

## 2022-04-02 ENCOUNTER — Ambulatory Visit: Payer: 59 | Admitting: Psychiatry

## 2022-04-04 ENCOUNTER — Other Ambulatory Visit: Payer: Self-pay | Admitting: Psychiatry

## 2022-04-04 DIAGNOSIS — E559 Vitamin D deficiency, unspecified: Secondary | ICD-10-CM

## 2022-04-21 ENCOUNTER — Telehealth: Payer: Self-pay | Admitting: Psychiatry

## 2022-04-21 NOTE — Telephone Encounter (Signed)
PA request received by [facility] for [place].

## 2022-05-04 ENCOUNTER — Other Ambulatory Visit: Payer: Self-pay | Admitting: Psychiatry

## 2022-05-04 DIAGNOSIS — E559 Vitamin D deficiency, unspecified: Secondary | ICD-10-CM

## 2022-05-04 NOTE — Telephone Encounter (Signed)
Has appt 4/16

## 2022-05-06 ENCOUNTER — Telehealth: Payer: Self-pay | Admitting: Psychiatry

## 2022-05-06 ENCOUNTER — Ambulatory Visit: Payer: 59 | Admitting: Psychiatry

## 2022-05-06 ENCOUNTER — Telehealth (INDEPENDENT_AMBULATORY_CARE_PROVIDER_SITE_OTHER): Payer: 59 | Admitting: Psychiatry

## 2022-05-06 ENCOUNTER — Encounter: Payer: Self-pay | Admitting: Psychiatry

## 2022-05-06 DIAGNOSIS — G4700 Insomnia, unspecified: Secondary | ICD-10-CM | POA: Diagnosis not present

## 2022-05-06 DIAGNOSIS — E559 Vitamin D deficiency, unspecified: Secondary | ICD-10-CM | POA: Diagnosis not present

## 2022-05-06 DIAGNOSIS — F32A Depression, unspecified: Secondary | ICD-10-CM

## 2022-05-06 DIAGNOSIS — F3341 Major depressive disorder, recurrent, in partial remission: Secondary | ICD-10-CM

## 2022-05-06 DIAGNOSIS — F4312 Post-traumatic stress disorder, chronic: Secondary | ICD-10-CM | POA: Diagnosis not present

## 2022-05-06 DIAGNOSIS — F330 Major depressive disorder, recurrent, mild: Secondary | ICD-10-CM | POA: Diagnosis not present

## 2022-05-06 MED ORDER — CLONAZEPAM 1 MG PO TABS: ORAL_TABLET | ORAL | 2 refills | Status: DC

## 2022-05-06 MED ORDER — VENLAFAXINE HCL ER 150 MG PO CP24
300.0000 mg | ORAL_CAPSULE | Freq: Every day | ORAL | 1 refills | Status: DC
Start: 2022-05-06 — End: 2022-10-02

## 2022-05-06 MED ORDER — BREXPIPRAZOLE 3 MG PO TABS: 3.0000 mg | ORAL_TABLET | Freq: Every day | ORAL | 2 refills | Status: DC

## 2022-05-06 MED ORDER — CARBAMAZEPINE ER 300 MG PO CP12: 600.0000 mg | ORAL_CAPSULE | Freq: Every day | ORAL | 0 refills | Status: DC

## 2022-05-06 MED ORDER — AUVELITY 45-105 MG PO TBCR: 1.0000 | EXTENDED_RELEASE_TABLET | Freq: Two times a day (BID) | ORAL | 1 refills | Status: DC

## 2022-05-06 MED ORDER — TRAZODONE HCL 100 MG PO TABS
ORAL_TABLET | ORAL | 1 refills | Status: DC
Start: 2022-05-06 — End: 2022-10-02

## 2022-05-06 NOTE — Progress Notes (Unsigned)
Diana Ayers [id number] 20-Feb-1960 62 y.o.  Virtual Visit via Video Note  I connected with pt @ on 05/06/22 at 10:00 AM EDT by a video enabled telemedicine application and verified that I am speaking with the correct person using two identifiers.   I discussed the limitations of evaluation and management by telemedicine and the availability of in person appointments. The patient expressed understanding and agreed to proceed.  I discussed the assessment and treatment plan with the patient. The patient was provided an opportunity to ask questions and all were answered. The patient agreed with the plan and demonstrated an understanding of the instructions.   The patient was advised to call back or seek an in-person evaluation if the symptoms worsen or if the condition fails to improve as anticipated.  I provided 25 minutes of non-face-to-face time during this encounter.  The patient was located at home.  The provider was located at [facility].   Corie Chiquito, PMHNP   Subjective:   Patient ID:  Diana Ayers is a 63 y.o. (DOB [birth year]) female.  Chief Complaint:  Chief Complaint  Patient presents with   Depression    HPI Diana Ayers presents for follow-up of anxiety, depression, and insomnia.   She reports that in the last 2-3 weeks, "it seems like I lost my motivation" and this occurred prior to GI symptoms. She reports that her energy has been lower. She reports that she is less interested in things. She reports that she has gained some weight. She has been eating more at night. Denies sad mood- "if I don't have anywhere to go, I will just stay around the house." Had some anxiety with having her mother and sister visit for Easter. Some worry about mother's health. Denies any significant PTSD symptoms recently. She reports that occasionally she will watch things on You Tube and occasionally videos/ads pop up that are triggering. Denies panic attacks.  She reports that her sleep is "ok." She some occ middle of the night awakenings. Denies change in concentration. Denies SI.   Visited mother a couple of weekends ago. Mother has not been feeling well.   She has been doing crochet.  She reports that she has not noticed teeth chattering recently.   She reports that she has been having some GI issues and feels "under the weather."   Denies any significant acute stressors.   Past Psychiatric Medication Trials: Klonopin- Reports that she was once on 6 mg total daily dose and has decreased to 3 mg. Reports Klonopin has been helpful for anxiety.  Trazodone- Effective. Has some excessive daytime somnolence. Latuda Rexulti Effexor XR- Has been helpful for mood and anxiety.  Prozac Paxil Zoloft Wellbutrin Carbamazepine XR- Has been helpful for episodic agitation/irritability/aggression Depakote- Helpful  Nortriptyline  Review of Systems:  Review of Systems  Gastrointestinal:        Frequent bowel movements  Musculoskeletal:  Negative for gait problem.  Neurological:  Negative for tremors.  Psychiatric/Behavioral:         Please refer to HPI    Medications: I have reviewed the patient's current medications.  Current Outpatient Medications  Medication Sig Dispense Refill   Brexpiprazole (REXULTI) 3 MG TABS Take 1 tablet (3 mg total) by mouth daily. 30 tablet 2   carbamazepine (CARBATROL) 300 MG 12 hr capsule Take 2 capsules (600 mg total) by mouth at bedtime. 180 capsule 0   [START ON 05/14/2022] clonazePAM (KLONOPIN) 1 MG tablet TAKE THREE TABLETS BY MOUTH EVERY  NIGHT AT BEDTIME AND TAKE ONE TO TWO TABLETS BY MOUTH AS NEEDED FOR ANXIETY **MUST LAST 30 DAYS** 120 tablet 2   Dextromethorphan-buPROPion ER (AUVELITY) 45-105 MG TBCR Take 1 tablet by mouth 2 (two) times daily. 180 tablet 1   diclofenac Sodium (VOLTAREN) 1 % GEL as needed.     fluorouracil (EFUDEX) 5 % cream Apply 1 Application topically 2 (two) times daily.      L-Methylfolate 15 MG TABS Take 1 tablet (15 mg total) by mouth daily. (Patient not taking: Reported on 12/25/2021) 30 tablet 5   loratadine (CLARITIN) 10 MG tablet Take 10 mg by mouth daily as needed.     MELATONIN PO Take 8 mg by mouth at bedtime.     Probiotic Product (PROBIOTIC PO) Take by mouth.     rosuvastatin (CRESTOR) 20 MG tablet Take 20 mg by mouth at bedtime.     SUMAtriptan (IMITREX) 100 MG tablet Take 1 tablet (100 mg total) by mouth once as needed for migraine or headache. May repeat in 2 hours if headache persists or recurs. 10 tablet 1   traZODone (DESYREL) 100 MG tablet Take 2-3 tabs po QHS 270 tablet 1   venlafaxine XR (EFFEXOR XR) 150 MG 24 hr capsule Take 2 capsules (300 mg total) by mouth daily with breakfast. 180 capsule 1   vitamin C (ASCORBIC ACID) 250 MG tablet Take 250 mg by mouth daily. (Patient not taking: Reported on 10/11/2021)     Vitamin D, Ergocalciferol, (DRISDOL) 1.25 MG (50000 UNIT) CAPS capsule TAKE 1 CAPSULE BY MOUTH ONCE WEEKLY 4 capsule 2   No current facility-administered medications for this visit.    Medication Side Effects: None  Allergies:  Allergies  Allergen Reactions   Onion Other (See Comments)   Tape Other (See Comments)    RASH, PT ALSO STATES RASH WITH COBAN    Past Medical History:  Diagnosis Date   Anxiety    Depression    FHx: malignant neoplasm of breast in first degree relative 07/23/2011   Hyperlipidemia    PTSD (post-traumatic stress disorder)    Thyroid disease     Family History  Problem Relation Age of Onset   Breast cancer Mother 19       BRCA/BART negative   Lung cancer Mother 70       smoker   Alcohol abuse Mother    Anemia Mother    Osteoporosis Mother    Breast cancer Sister 69   Melanoma Sister 20   Osteoporosis Sister    Dementia Father    Breast cancer Maternal Aunt 25   Breast cancer Sister 63       BRCA-/no BART   Lung cancer Maternal Uncle        smoker   Lung cancer Maternal Uncle        smoker    Heart attack Maternal Aunt    Schizophrenia Maternal Aunt    Schizophrenia Maternal Aunt    Schizophrenia Maternal Uncle     Social History   Socioeconomic History   Marital status: Single    Spouse name: Not on file   Number of children: 0   Years of education: Not on file   Highest education level: Bachelor's degree (e.g., BA, AB, BS)  Occupational History   Not on file  Tobacco Use   Smoking status: Never   Smokeless tobacco: Never  Vaping Use   Vaping Use: Never used  Substance and Sexual Activity   Alcohol use: No  Drug use: No   Sexual activity: Not Currently  Other Topics Concern   Not on file  Social History Narrative   Not on file   Social Determinants of Health   Financial Resource Strain: Low Risk  (07/16/2017)   Overall Financial Resource Strain (CARDIA)    Difficulty of Paying Living Expenses: Not hard at all  Food Insecurity: No Food Insecurity (07/16/2017)   Hunger Vital Sign    Worried About Running Out of Food in the Last Year: Never true    Ran Out of Food in the Last Year: Never true  Transportation Needs: No Transportation Needs (07/16/2017)   PRAPARE - Administrator, Civil Service (Medical): No    Lack of Transportation (Non-Medical): No  Physical Activity: Inactive (07/16/2017)   Exercise Vital Sign    Days of Exercise per Week: 0 days    Minutes of Exercise per Session: 0 min  Stress: Stress Concern Present (07/16/2017)   [employer] of Occupational Health - Occupational Stress Questionnaire    Feeling of Stress : Rather much  Social Connections: Somewhat Isolated (07/16/2017)   Social Connection and Isolation Panel [NHANES]    Frequency of Communication with Friends and Family: More than three times a week    Frequency of Social Gatherings with Friends and Family: Twice a week    Attends Religious Services: More than 4 times per year    Active Member of [employer] or Organizations: No    Attends Banker Meetings:  Never    Marital Status: Never married  Intimate Partner Violence: Not At Risk (07/16/2017)   Humiliation, Afraid, Rape, and Kick questionnaire    Fear of Current or Ex-Partner: No    Emotionally Abused: No    Physically Abused: No    Sexually Abused: No    Past Medical History, Surgical history, Social history, and Family history were reviewed and updated as appropriate.   Please see review of systems for further details on the patient's review from today.   Objective:   Physical Exam:  There were no vitals taken for this visit.  Physical Exam Neurological:     Mental Status: She is alert and oriented to person, place, and time.     Cranial Nerves: No dysarthria.  Psychiatric:        Attention and Perception: Attention and perception normal.        Mood and Affect: Mood is depressed.        Speech: Speech normal.        Behavior: Behavior is cooperative.        Thought Content: Thought content normal. Thought content is not paranoid or delusional. Thought content does not include homicidal or suicidal ideation. Thought content does not include homicidal or suicidal plan.        Cognition and Memory: Cognition and memory normal.        Judgment: Judgment normal.     Comments: Insight intact     Lab Review:     Component Value Date/Time   NA 138 11/21/2019 1205   NA 140 07/20/2008 1030   K 4.3 11/21/2019 1205   K 3.9 07/20/2008 1030   CL 101 11/21/2019 1205   CL 101 07/20/2008 1030   CO2 30 11/21/2019 1205   CO2 30 07/20/2008 1030   GLUCOSE 98 11/21/2019 1205   GLUCOSE 106 07/20/2008 1030   BUN 11 11/21/2019 1205   BUN 13 07/20/2008 1030   CREATININE 0.65 11/21/2019 1205  CALCIUM 9.9 11/21/2019 1205   CALCIUM 9.2 07/20/2008 1030   PROT 7.0 11/21/2019 1205   PROT 7.5 07/20/2008 1030   ALBUMIN 4.3 07/23/2011 1427   ALBUMIN 3.7 07/20/2008 1030   AST 16 11/21/2019 1205   AST 22 07/20/2008 1030   ALT 14 11/21/2019 1205   ALT 22 07/20/2008 1030   ALKPHOS 55  07/23/2011 1427   ALKPHOS 67 07/20/2008 1030   BILITOT 0.4 11/21/2019 1205   BILITOT 0.50 07/20/2008 1030       Component Value Date/Time   WBC 5.5 11/21/2019 1205   RBC 4.58 11/21/2019 1205   HGB 14.6 11/21/2019 1205   HGB 13.3 07/23/2011 1427   HCT 42.8 11/21/2019 1205   HCT 40.8 07/23/2011 1427   PLT 210 11/21/2019 1205   PLT 196 07/23/2011 1427   MCV 93.4 11/21/2019 1205   MCV 95.0 07/23/2011 1427   MCH 31.9 11/21/2019 1205   MCHC 34.1 11/21/2019 1205   RDW 11.8 11/21/2019 1205   RDW 12.4 07/23/2011 1427   LYMPHSABS 2.3 07/23/2011 1427   MONOABS 0.5 07/23/2011 1427   EOSABS 0.2 07/23/2011 1427   EOSABS 0.2 07/20/2008 1030   BASOSABS 0.1 07/23/2011 1427    No results found for: "POCLITH", "LITHIUM"   Lab Results  Component Value Date   CBMZ 8.4 11/21/2019     .res Assessment: Plan:   Patient seen for 30 minutes and time spent discussing response to decrease in Rexulti dosage and recent worsening depression.  Discussed option to increase Rexulti to previous dose of 3 mg or to continue current dose.  Patient reports that she would like to increase Rexulti to 3 mg since experiencing a loss of energy and motivation after decrease in dose.  Discussed risk of recurrence of fatigue chattering/ TD with higher doses and advised patient to contact provider if this happens. Discussed lab monitoring for possible adverse effects.  She reports that she will schedule annual wellness exam for labs since she is due for an annual wellness exam. Will continue all other medications as prescribed. Pt to follow-up with this provider in 6-8 weeks or sooner if clinically indicated.  Recommend continuing therapy with Stevphen Meuse, [facility].  Patient advised to contact office with any questions, adverse effects, or acute worsening in signs and symptoms.   Nia was seen today for depression.  Diagnoses and all orders for this visit:  Mild episode of recurrent major depressive disorder -      Brexpiprazole (REXULTI) 3 MG TABS; Take 1 tablet (3 mg total) by mouth daily. -     carbamazepine (CARBATROL) 300 MG 12 hr capsule; Take 2 capsules (600 mg total) by mouth at bedtime. -     Dextromethorphan-buPROPion ER (AUVELITY) 45-105 MG TBCR; Take 1 tablet by mouth 2 (two) times daily. -     venlafaxine XR (EFFEXOR XR) 150 MG 24 hr capsule; Take 2 capsules (300 mg total) by mouth daily with breakfast.  Vitamin D deficiency  Chronic post-traumatic stress disorder (PTSD) -     clonazePAM (KLONOPIN) 1 MG tablet; TAKE THREE TABLETS BY MOUTH EVERY NIGHT AT BEDTIME AND TAKE ONE TO TWO TABLETS BY MOUTH AS NEEDED FOR ANXIETY **MUST LAST 30 DAYS** -     traZODone (DESYREL) 100 MG tablet; Take 2-3 tabs po QHS -     venlafaxine XR (EFFEXOR XR) 150 MG 24 hr capsule; Take 2 capsules (300 mg total) by mouth daily with breakfast.  Insomnia, unspecified type -     clonazePAM (  KLONOPIN) 1 MG tablet; TAKE THREE TABLETS BY MOUTH EVERY NIGHT AT BEDTIME AND TAKE ONE TO TWO TABLETS BY MOUTH AS NEEDED FOR ANXIETY **MUST LAST 30 DAYS**     Please see After Visit Summary for patient specific instructions.  Future Appointments  Date Time Provider Department Center  05/26/2022 11:00 AM Stevphen Meuse, [facility] None  06/24/2022 10:00 AM Stevphen Meuse, [facility] None  07/31/2022 10:00 AM Stevphen Meuse, [facility] None    No orders of the defined types were placed in this encounter.     -------------------------------

## 2022-05-06 NOTE — Telephone Encounter (Signed)
[facility] sent PA Request for REXULTI   See CMM

## 2022-05-08 ENCOUNTER — Telehealth: Payer: Self-pay | Admitting: Psychiatry

## 2022-05-08 NOTE — Telephone Encounter (Signed)
Patient checking on status of PA for Rexulti.

## 2022-05-08 NOTE — Telephone Encounter (Signed)
Patient lvm today at 1:41 inquiring on the status of the PA for the Rexulti. See telephone encounter 4/16.

## 2022-05-09 NOTE — Telephone Encounter (Signed)
Prior authorization initiated through a renewal of Rexulti 3 mg from 2023 but her approval is effective through 12/2022 with her same insurance. Not sure why it's not taking at pharmacy for her to fill.   I did go ahead and submit a new PA to [facility] .

## 2022-05-09 NOTE — Telephone Encounter (Signed)
No PA needed previous approval through 01/03/2023

## 2022-05-09 NOTE — Telephone Encounter (Signed)
Thanks Clarisse Gouge- response back from insurance was PA already approved effective through 01/03/2023.

## 2022-05-09 NOTE — Telephone Encounter (Signed)
Called pharmacy and had them run it and it went thru. Patient notified. She got a notification from the pharmacy while I was on the phone with her.

## 2022-05-26 ENCOUNTER — Ambulatory Visit (INDEPENDENT_AMBULATORY_CARE_PROVIDER_SITE_OTHER): Payer: 59 | Admitting: Psychiatry

## 2022-05-26 ENCOUNTER — Encounter: Payer: Self-pay | Admitting: Psychiatry

## 2022-05-26 DIAGNOSIS — F330 Major depressive disorder, recurrent, mild: Secondary | ICD-10-CM
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
## 2022-06-24 ENCOUNTER — Ambulatory Visit (INDEPENDENT_AMBULATORY_CARE_PROVIDER_SITE_OTHER): Payer: 59 | Admitting: Psychiatry

## 2022-06-24 DIAGNOSIS — F330 Major depressive disorder, recurrent, mild: Secondary | ICD-10-CM | POA: Diagnosis not present

## 2022-06-24 NOTE — Progress Notes (Unsigned)
[facility] Counselor/Therapist Progress Note  Patient ID: Diana Ayers, MRN: [id number],    Date: 06/24/2022  Time Spent: 59 minutes start time 10:01 AM end time 11:00 AM Virtual Visit via Video Note Connected with patient by a telemedicine/telehealth application, with their informed consent, and verified patient privacy and that I am speaking with the correct person using two identifiers. I discussed the limitations, risks, security and privacy concerns of performing psychotherapy and the availability of in person appointments. I also discussed with the patient that there may be a patient responsible charge related to this service. The patient expressed understanding and agreed to proceed. I discussed the treatment planning with the patient. The patient was provided an opportunity to ask questions and all were answered. The patient agreed with the plan and demonstrated an understanding of the instructions. The patient was advised to call  our office if  symptoms worsen or feel they are in a crisis state and need immediate contact.   Therapist Location: home Patient Location: home    Treatment Type: Individual Therapy  Reported Symptoms: fatigue, low motivation, sadness, loss of interest in things she enjoys, sadness, triggered responses, anxiety  Mental Status Exam:  Appearance:   Casual     Behavior:  Appropriate  Motor:  Normal  Speech/Language:   Normal Rate  Affect:  Appropriate  Mood:  labile  Thought process:  normal  Thought content:    WNL  Sensory/Perceptual disturbances:    WNL  Orientation:  oriented to person, place, time/date, and situation  Attention:  Good  Concentration:  Good  Memory:  WNL  Fund of knowledge:   Good  Insight:    Good  Judgment:   Good  Impulse Control:  Good   Risk Assessment: Danger to Self:  No Self-injurious Behavior: No Danger to Others: No Duty to Warn:no Physical Aggression / Violence:No  Access to Firearms a concern:  No  Gang Involvement:No   Subjective: Met with patient via virtual session.  She shared she is still in Bible studies which has been a good things for her.  She went on to explain she does seem to have a routine that is good but if she continues with that she won't be walking until it is hot and that is not good for her.  Explored different options and encouraged her to think through a plan that she feels is realistic and she is wiling to follow through to make sure she is exercising regularly.educated patient on the importance of exercising especially with her history of father having Alzheimer's.  Patient is doing better with setting limits with her mother which is good.  She was able to get her niece to help with some of the surgeries and doctor visits which has been helpful for patient.  Patient is going to help her mother get through a cataract surgery next week.  Discussed a friend that has come back in her life that had a suicidal issues in her past and patient has had to take her for evaluations. She was able to recognize that when she spends lots of time with her she gets more sadness as well as flashbacks. Encouraged her to think through what she does that triggers the sadness as well as the flashbacks for her some. She went on to explain when she was talking about traumas patient explained that she disassociated and that was troubling for her. Discussed how friend seems to have some similarities to her mother and  she will need to limit her contact with her.  Different strategies to help her do that were discussed with patient.  Patient reported feeling positive about the plan from session.  Interventions: Solution-Oriented/Positive Psychology and Insight-Oriented  Diagnosis:   ICD-10-CM   1. Mild episode of recurrent major depressive disorder (HCC)  F33.0       Plan:  Patient is to use CBT and coping skills to decrease depression symptoms.  Patient is to follow plan from session to set limits  with her friend and to continue working on developing a routine that will incorporate exercise into her daily life.  Patient is to continue working on filter to focus on the things that she can control fix and change and recognize that she may not function like others and that is okay.  Patient is to continue participating in the reboot program.  Patient is to participate in her church Bible study and work on developing more relationships outside of the group time.  Patient is to exercise to release negative emotions appropriately.  Patient is to work on information about the neuro cycle by Dr. De Burrs.  Patient is to try to get in a habit of studying her reboot information every day to prepare for leaving the groups.  Patient is to take medication as directed.  Patient is to work with other medical providers on medical issues that are concerning for her.  She is also to research different diets that could be helpful for her. Long-term goal: Elevate mood and show evidence of usual energy activities and socialization level Short-term goal: Identify and replace depressive thinking that leads to depressive feelings and actions  Stevphen Meuse, [facility]

## 2022-07-08 ENCOUNTER — Encounter: Payer: Self-pay | Admitting: Psychiatry

## 2022-07-08 ENCOUNTER — Ambulatory Visit (INDEPENDENT_AMBULATORY_CARE_PROVIDER_SITE_OTHER): Payer: 59 | Admitting: Psychiatry

## 2022-07-08 VITALS — Wt 177.0 lb

## 2022-07-08 DIAGNOSIS — F330 Major depressive disorder, recurrent, mild: Secondary | ICD-10-CM

## 2022-07-08 DIAGNOSIS — E559 Vitamin D deficiency, unspecified: Secondary | ICD-10-CM

## 2022-07-08 DIAGNOSIS — F4310 Post-traumatic stress disorder, unspecified: Secondary | ICD-10-CM

## 2022-07-08 MED ORDER — CARBAMAZEPINE ER 300 MG PO CP12
300.0000 mg | ORAL_CAPSULE | Freq: Every day | ORAL | 0 refills | Status: DC
Start: 2022-07-08 — End: 2022-08-07

## 2022-07-08 MED ORDER — VITAMIN D (ERGOCALCIFEROL) 1.25 MG (50000 UNIT) PO CAPS
50000.0000 [IU] | ORAL_CAPSULE | ORAL | 2 refills | Status: DC
Start: 2022-07-08 — End: 2022-10-27

## 2022-07-08 MED ORDER — BREXPIPRAZOLE 2 MG PO TABS
2.00 mg | ORAL_TABLET | Freq: Every day | ORAL | 0 refills | Status: DC
Start: 2022-07-08 — End: 2022-07-28

## 2022-07-08 NOTE — Progress Notes (Signed)
Diana Ayers [id number] 1960-05-17 62 y.o.  Subjective:   Patient ID:  Diana Ayers is a 62 y.o. (DOB [birth year]) female.  Chief Complaint:  Chief Complaint  Patient presents with   Fatigue   Other    Low motivation    HPI Diana Ayers presents to the office today for follow-up of depression and anxiety. She reports lower energy and motivation. She reports that she has not left home in the last 4 days. Denies depressed mood. Denies diminished interest in things and has started a new hobby with crochet. Listening to audiobooks. She reports that anxiety has been ok. Sleeping well. She reports increased appetite. She reports that she typically wants to eat in the evenings so that she does not take medications on an empty stomach. Concentration has been adequate. Denies SI.   She reports weight gain. She reports that Hgb A1C has been slightly elevated and this has occurred before when weight has been higher.   She communicates with some friends.   Klonopin last filled 06/25/22 x 2.  Past Psychiatric Medication Trials: Klonopin- Reports that she was once on 6 mg total daily dose and has decreased to 3 mg. Reports Klonopin has been helpful for anxiety.  Trazodone- Effective. Has some excessive daytime somnolence. Latuda Rexulti Effexor XR- Has been helpful for mood and anxiety.  Prozac Paxil Zoloft Wellbutrin Carbamazepine XR- Has been helpful for episodic agitation/irritability/aggression Depakote- Helpful  Nortriptyline   AIMS    Flowsheet Row Office Visit from 07/08/2022 in [facility] Video Visit from 05/06/2022 in [facility] Office Visit from 12/25/2021 in [facility] Video Visit from 08/13/2021 in [facility] Office Visit from 03/07/2021 in [name]  AIMS Total Score 0 0 1 0 0      [id number]    Flowsheet Row  Nutrition from 05/05/2017 in [facility] [place] [facility] from 08/03/2014 in [facility] [place] [facility] from 06/16/2014 in [facility] Total Score 0 6 3        Review of Systems:  Review of Systems  Gastrointestinal: Negative.   Musculoskeletal:  Negative for gait problem.  Neurological:  Negative for tremors.  Psychiatric/Behavioral:         Please refer to HPI    Medications: I have reviewed the patient's current medications.  Current Outpatient Medications  Medication Sig Dispense Refill   brexpiprazole (REXULTI) 2 MG TABS tablet Take 1 tablet (2 mg total) by mouth daily. 21 tablet 0   carbamazepine (CARBATROL) 300 MG 12 hr capsule Take 1 capsule (300 mg total) by mouth at bedtime. 180 capsule 0   clonazePAM (KLONOPIN) 1 MG tablet TAKE THREE TABLETS BY MOUTH EVERY NIGHT AT BEDTIME AND TAKE ONE TO TWO TABLETS BY MOUTH AS NEEDED FOR ANXIETY **MUST LAST 30 DAYS** 120 tablet 2   Dextromethorphan-buPROPion ER (AUVELITY) 45-105 MG TBCR Take 1 tablet by mouth 2 (two) times daily. 180 tablet 1   diclofenac Sodium (VOLTAREN) 1 % GEL as needed.     fluorouracil (EFUDEX) 5 % cream Apply 1 Application topically 2 (two) times daily.     L-Methylfolate 15 MG TABS Take 1 tablet (15 mg total) by mouth daily. (Patient not taking: Reported on 12/25/2021) 30 tablet 5   loratadine (CLARITIN) 10 MG tablet Take 10 mg by mouth  daily as needed.     MELATONIN PO Take 8 mg by mouth at bedtime.     Probiotic Product (PROBIOTIC PO) Take by mouth.     rosuvastatin (CRESTOR) 20 MG tablet Take 20 mg by mouth at bedtime.     SUMAtriptan (IMITREX) 100 MG tablet Take 1 tablet (100 mg total) by mouth once as needed for migraine or headache. May repeat in 2 hours if headache persists or recurs. 10 tablet 1   traZODone (DESYREL) 100 MG tablet Take 2-3 tabs po QHS 270  tablet 1   venlafaxine XR (EFFEXOR XR) 150 MG 24 hr capsule Take 2 capsules (300 mg total) by mouth daily with breakfast. 180 capsule 1   vitamin C (ASCORBIC ACID) 250 MG tablet Take 250 mg by mouth daily. (Patient not taking: Reported on 10/11/2021)     Vitamin D, Ergocalciferol, (DRISDOL) 1.25 MG (50000 UNIT) CAPS capsule Take 1 capsule (50,000 Units total) by mouth once a week. 4 capsule 2   No current facility-administered medications for this visit.    Medication Side Effects: Other: Weight gain, possible decreased Vit D absorption  Allergies:  Allergies  Allergen Reactions   Onion Other (See Comments)   Tape Other (See Comments)    RASH, PT ALSO STATES RASH WITH COBAN    Past Medical History:  Diagnosis Date   Anxiety    Depression    FHx: malignant neoplasm of breast in first degree relative 07/23/2011   Hyperlipidemia    PTSD (post-traumatic stress disorder)    Thyroid disease     Past Medical History, Surgical history, Social history, and Family history were reviewed and updated as appropriate.   Please see review of systems for further details on the patient's review from today.   Objective:   Physical Exam:  Wt 177 lb (80.3 kg)   BMI 32.37 kg/m   Physical Exam Constitutional:      General: She is not in acute distress. Musculoskeletal:        General: No deformity.  Neurological:     Mental Status: She is alert and oriented to person, place, and time.     Coordination: Coordination normal.  Psychiatric:        Attention and Perception: Attention and perception normal. She does not perceive auditory or visual hallucinations.        Mood and Affect: Mood normal. Mood is not anxious or depressed. Affect is not labile, blunt, angry or inappropriate.        Speech: Speech normal.        Behavior: Behavior normal.        Thought Content: Thought content normal. Thought content is not paranoid or delusional. Thought content does not include homicidal or suicidal  ideation. Thought content does not include homicidal or suicidal plan.        Cognition and Memory: Cognition and memory normal.        Judgment: Judgment normal.     Comments: Insight intact     Lab Review:     Component Value Date/Time   NA 138 11/21/2019 1205   NA 140 07/20/2008 1030   K 4.3 11/21/2019 1205   K 3.9 07/20/2008 1030   CL 101 11/21/2019 1205   CL 101 07/20/2008 1030   CO2 30 11/21/2019 1205   CO2 30 07/20/2008 1030   GLUCOSE 98 11/21/2019 1205   GLUCOSE 106 07/20/2008 1030   BUN 11 11/21/2019 1205   BUN 13 07/20/2008 1030   CREATININE  0.65 11/21/2019 1205   CALCIUM 9.9 11/21/2019 1205   CALCIUM 9.2 07/20/2008 1030   PROT 7.0 11/21/2019 1205   PROT 7.5 07/20/2008 1030   ALBUMIN 4.3 07/23/2011 1427   ALBUMIN 3.7 07/20/2008 1030   AST 16 11/21/2019 1205   AST 22 07/20/2008 1030   ALT 14 11/21/2019 1205   ALT 22 07/20/2008 1030   ALKPHOS 55 07/23/2011 1427   ALKPHOS 67 07/20/2008 1030   BILITOT 0.4 11/21/2019 1205   BILITOT 0.50 07/20/2008 1030       Component Value Date/Time   WBC 5.5 11/21/2019 1205   RBC 4.58 11/21/2019 1205   HGB 14.6 11/21/2019 1205   HGB 13.3 07/23/2011 1427   HCT 42.8 11/21/2019 1205   HCT 40.8 07/23/2011 1427   PLT 210 11/21/2019 1205   PLT 196 07/23/2011 1427   MCV 93.4 11/21/2019 1205   MCV 95.0 07/23/2011 1427   MCH 31.9 11/21/2019 1205   MCHC 34.1 11/21/2019 1205   RDW 11.8 11/21/2019 1205   RDW 12.4 07/23/2011 1427   LYMPHSABS 2.3 07/23/2011 1427   MONOABS 0.5 07/23/2011 1427   EOSABS 0.2 07/23/2011 1427   EOSABS 0.2 07/20/2008 1030   BASOSABS 0.1 07/23/2011 1427    No results found for: "POCLITH", "LITHIUM"   Lab Results  Component Value Date   CBMZ 8.4 11/21/2019     .res Assessment: Plan:    Discussed dose reduction of Carbamazepine since benefit is unclear. Discussed that Carbamazepine may be contributing to fatigue and possibly interfering with Vitamin D absorption. Discussed that Carbamazepine  also lowers blood levels of Auvelity, and lowering dose may increase therapeutic effects of Auvelity. Pt agrees to decrease in Carbamazepine. Will decrease Carbamazepine to 300 mg po at bedtime.  Will reduce Rexulti from 3 mg to 2 mg since there has not been a significant improvement in depression with increased dose. Also, pt reports weight gain that coincides with start and increase of Rexulti and Hgb A1C has also increased.  Discussed considering switch to Vraylar in the future if low energy persists.  Continue Effexor XR 300 mg daily for anxiety and depression.  Continue Klonopin for insomnia and as needed for anxiety.  Will continue Auvelity 45-105 mg one tablet twice daily for depression. Continue Trazodone 100 mg 2-3 tabs po at bedtime for insomnia.  Pt to follow-up with this provider in 4 weeks or sooner if clinically indicated.  Patient advised to contact office with any questions, adverse effects, or acute worsening in signs and symptoms. Recommend continuing therapy with Stevphen Meuse, [facility].   I spent 30 minutes dedicated to the care of this patient on the date of this  encounter to include pre-visit review of records, face-to-face time with the patient discussing lab results and treatment plan, ordering of medication, and post visit documentation.   Diana Ayers was seen today for fatigue and other.  Diagnoses and all orders for this visit:  PTSD (post-traumatic stress disorder)  Mild episode of recurrent major depressive disorder (HCC) -     carbamazepine (CARBATROL) 300 MG 12 hr capsule; Take 1 capsule (300 mg total) by mouth at bedtime. -     brexpiprazole (REXULTI) 2 MG TABS tablet; Take 1 tablet (2 mg total) by mouth daily.  Vitamin D deficiency -     Vitamin D, Ergocalciferol, (DRISDOL) 1.25 MG (50000 UNIT) CAPS capsule; Take 1 capsule (50,000 Units total) by mouth once a week.     Please see After Visit Summary for patient specific instructions.  Future Appointments   Date Time Provider Department Center  07/31/2022 10:00 AM Stevphen Meuse, [facility] CP-CP None  08/07/2022 10:30 AM Corie Chiquito, [facility] CP-CP None  08/18/2022 11:00 AM Stevphen Meuse, [facility] CP-CP None  09/02/2022  1:00 PM Stevphen Meuse, [facility] None  09/25/2022 11:00 AM Stevphen Meuse, [facility] CP-CP None  10/27/2022 10:00 AM Stevphen Meuse, [facility] CP-CP None    No orders of the defined types were placed in this encounter.   -------------------------------

## 2022-07-09 ENCOUNTER — Encounter: Payer: Self-pay | Admitting: Family Medicine

## 2022-07-15 ENCOUNTER — Telehealth: Payer: Self-pay | Admitting: Psychiatry

## 2022-07-15 NOTE — Telephone Encounter (Signed)
Pt called at 3:11p requesting refill for 2 days of Clonazepam.  She is in [place] with her mom for surgery and forgot her "night" meds.  She is asking for 6 pills to be sent to [facility], [address] [place] in [place], [place] [phone].  Next appt 7/18

## 2022-07-16 MED ORDER — CLONAZEPAM 1 MG PO TABS: 3.0000 mg | ORAL_TABLET | Freq: Every day | ORAL | 0 refills | Status: DC

## 2022-07-16 NOTE — Telephone Encounter (Signed)
Please let her know it has been sent in. Please advise her to take some Klonopin this morning if she did not have it last night to prevent withdrawal symptoms.

## 2022-07-25 ENCOUNTER — Telehealth: Payer: Self-pay | Admitting: Psychiatry

## 2022-07-25 DIAGNOSIS — F330 Major depressive disorder, recurrent, mild: Secondary | ICD-10-CM

## 2022-07-25 NOTE — Telephone Encounter (Signed)
Please see message.  From last visit:  Will reduce Rexulti from 3 mg to 2 mg since there has not been a significant improvement in depression with increased dose. Also, pt reports weight gain that coincides with start and increase of Rexulti and Hgb A1C has also increased.   Should she stay at 2 mg? A Rx was sent 6/18 for 2 mg. It doesn't appear that a PA was ever done for 2 mg. Can provide samples until it is determined if a PA is needed.

## 2022-07-25 NOTE — Telephone Encounter (Signed)
Pt called and said that she will run out of the 2 mg samples of rexulti before her next appt with [place]. She wants to know does she need to go down to 1 mg or just stop at 2 mg. She has a weeks worth of the 2 mg samples of rexulti. Please call her at [phone]

## 2022-07-28 MED ORDER — BREXPIPRAZOLE 2 MG PO TABS
2.0000 mg | ORAL_TABLET | Freq: Every day | ORAL | 0 refills | Status: DC
Start: 2022-07-28 — End: 2022-08-07

## 2022-07-28 NOTE — Addendum Note (Signed)
Addended by: Karin Lieu T on: 07/28/2022 02:13 PM   Modules accepted: Orders

## 2022-07-28 NOTE — Telephone Encounter (Signed)
Rx sent. Pulled samples. Notified patient.

## 2022-07-29 NOTE — Telephone Encounter (Signed)
Yes, that is correct. We discussed considering changing to Vraylar since she has continued to have depression with Rexulti and possibly weight gain. We are also decreasing Carbamazepine.

## 2022-07-29 NOTE — Telephone Encounter (Signed)
Rx sent for 2 mg Rexulti and called patient to let her know samples were pulled for her to allow time for PA to be done. Patient is thinking you are going to further titrate this medication and prescribe a different medication.

## 2022-07-31 ENCOUNTER — Ambulatory Visit: Payer: 59 | Admitting: Psychiatry

## 2022-07-31 DIAGNOSIS — F3341 Major depressive disorder, recurrent, in partial remission: Secondary | ICD-10-CM

## 2022-07-31 NOTE — Progress Notes (Signed)
[facility] Counselor/Therapist Progress Note  Patient ID: Diana Ayers, MRN: [id number],    Date: 07/31/2022  Time Spent: 50 minutes start time 10:10 AM end time 11 AM  Treatment Type: Individual Therapy  Reported Symptoms: fatigue, low motivation, anxiety, memory issues, rumination  Mental Status Exam:  Appearance:   Well Groomed     Behavior:  Appropriate  Motor:  Normal  Speech/Language:   Normal Rate  Affect:  Appropriate  Mood:  normal  Thought process:  normal  Thought content:    WNL  Sensory/Perceptual disturbances:    WNL  Orientation:  oriented to person, place, time/date, and situation  Attention:  Good  Concentration:  Good  Memory:  WNL  Fund of knowledge:   Good  Insight:    Good  Judgment:   Good  Impulse Control:  Good   Risk Assessment: Danger to Self:  No Self-injurious Behavior: No Danger to Others: No Duty to Warn:no Physical Aggression / Violence:No  Access to Firearms a concern: No  Gang Involvement:No   Subjective: Patient was present for session. She shared that she is still struggling with motivation. She has been pushing herself to do things but it is difficult with the weather as it is.  She has been getting herself to walk.  She is crocheting and reading which is good.  She is trying to catch up with friends and doing her Sunday night group.  Mom had cataract surgery so she went to care for her.  She is going through medication changes and hopeful that will help her.  Patient was also able to identify other things that she is doing that are positive.  She was also able to realize that they he is keeping her from being able to do all the things that she would like to do.  Encouraged patient to recognize that she is moving in a positive direction and that she just has to keep doing what she is doing so that she can feel better and feel more productive.  Patient was encouraged to keep things to a once a month basis since she is making  progress.  Interventions: Solution-Oriented/Positive Psychology and Insight-Oriented  Diagnosis:   ICD-10-CM   1. Recurrent major depressive disorder, in partial remission (HCC)  F33.41       Plan:   Patient is to use CBT and coping skills to decrease depression symptoms.  Patient is to continue doing the things that are moving her in a positive direction.  Patient is to continue working on filter to focus on the things that she can control fix and change and recognize that she may not function like others and that is okay.  Patient is to continue participating in the reboot program.  Patient is to participate in her church Bible study and work on developing more relationships outside of the group time.  Patient is to exercise to release negative emotions appropriately.  Patient is to work on information about the neuro cycle by Dr. De Burrs.  Patient is to try to get in a habit of studying her reboot information every day to prepare for leaving the groups.  Patient is to take medication as directed.  Patient is to work with other medical providers on medical issues that are concerning for her.  She is also to research different diets that could be helpful for her. Long-term goal: Elevate mood and show evidence of usual energy activities and socialization level Short-term goal: Identify and  replace depressive thinking that leads to depressive feelings and actions  Stevphen Meuse, [facility]

## 2022-07-31 NOTE — Telephone Encounter (Signed)
Patient will pick up samples, has FU 7/18.

## 2022-08-07 ENCOUNTER — Encounter: Payer: Self-pay | Admitting: Psychiatry

## 2022-08-07 ENCOUNTER — Ambulatory Visit (INDEPENDENT_AMBULATORY_CARE_PROVIDER_SITE_OTHER): Payer: 59 | Admitting: Psychiatry

## 2022-08-07 DIAGNOSIS — F4312 Post-traumatic stress disorder, chronic: Secondary | ICD-10-CM

## 2022-08-07 DIAGNOSIS — F330 Major depressive disorder, recurrent, mild: Secondary | ICD-10-CM | POA: Diagnosis not present

## 2022-08-07 DIAGNOSIS — G4700 Insomnia, unspecified: Secondary | ICD-10-CM

## 2022-08-07 MED ORDER — CARBAMAZEPINE ER 100 MG PO CP12: ORAL_CAPSULE | ORAL | Status: DC

## 2022-08-07 MED ORDER — CLONAZEPAM 1 MG PO TABS: ORAL_TABLET | ORAL | 2 refills | Status: DC

## 2022-08-07 MED ORDER — BREXPIPRAZOLE 2 MG PO TABS
ORAL_TABLET | ORAL | Status: DC
Start: 2022-08-07 — End: 2022-09-09

## 2022-08-07 NOTE — Progress Notes (Signed)
Diana Ayers [id number] 1960/09/09 62 y.o.  Subjective:   Patient ID:  Diana Ayers is a 62 y.o. (DOB [birth year]) female.  Chief Complaint:  Chief Complaint  Patient presents with   Follow-up    Depression, anxiety, and insomnia    HPI Diana Ayers presents to the office today for follow-up of depression, anxiety, and insomnia. She denies any "adverse reactions" with dose decreases. She denies any change in mood. She reports that she has not been walking outside due to the heat. Denies sad mood. Energy is "about the same, low energy." No change in motivation. Doing usually one thing outside of the house daily. Reports taking care of "basics" around the house and her hygiene. She reports that she is gaining weight. Appetite has been good. She reports anxiety in response to current events and hearing about different conspiracy theories. She has some anxiety about upcoming party for roommate. She had some anxiety and agitation with taking more to follow-up apt after given incorrect directions. Denies panic. Sleeping well. Concentration has been ok. She has been able to focus on crochet while listening to audiobooks. Denies SI.    She had an episode of vertigo last week for the first time in 18 years.   Mother had cataract surgery and went to help her.   Helping host a party for her roommate next month.     Past Psychiatric Medication Trials: Klonopin- Reports that she was once on 6 mg total daily dose and has decreased to 3 mg. Reports Klonopin has been helpful for anxiety.  Trazodone- Effective. Has some excessive daytime somnolence. Latuda Rexulti Effexor XR- Has been helpful for mood and anxiety.  Prozac Paxil Zoloft Wellbutrin Carbamazepine XR- Has been helpful for episodic agitation/irritability/aggression Depakote- Helpful  Nortriptyline    AIMS    Flowsheet Row Office Visit from 07/08/2022 in [facility] Video Visit from  05/06/2022 in [facility] Office Visit from 12/25/2021 in [facility] Video Visit from 08/13/2021 in [facility] Office Visit from 03/07/2021 in [facility]  AIMS Total Score 0 0 1 0 0      [id number]    Flowsheet Row Nutrition from 05/05/2017 in [facility] [place] [facility] from 08/03/2014 in [facility] [place] [facility] from 06/16/2014 in [facility] Total Score 0 6 3        Review of Systems:  Review of Systems  HENT:  Negative for ear pain.   Musculoskeletal:  Negative for gait problem.  Neurological:  Positive for dizziness.       Recent vertigo  Psychiatric/Behavioral:         Please refer to HPI    Medications: I have reviewed the patient's current medications.  Current Outpatient Medications  Medication Sig Dispense Refill   Carbamazepine (EQUETRO) 100 MG CP12 12 hr capsule Take 200 mg (2 capsules) at bedtime for 3 nights, then decrease to 100 mg (1 capsule) for 3 nights, then stop     diclofenac Sodium (VOLTAREN) 1 % GEL as needed.     MELATONIN PO Take 8 mg by mouth at bedtime.     rosuvastatin (CRESTOR) 20 MG tablet Take 20 mg by mouth at bedtime.     traZODone (DESYREL) 100 MG tablet Take 2-3 tabs po QHS 270 tablet 1  venlafaxine XR (EFFEXOR XR) 150 MG 24 hr capsule Take 2 capsules (300 mg total) by mouth daily with breakfast. 180 capsule 1   Vitamin D, Ergocalciferol, (DRISDOL) 1.25 MG (50000 UNIT) CAPS capsule Take 1 capsule (50,000 Units total) by mouth once a week. 4 capsule 2   brexpiprazole (REXULTI) 2 MG TABS tablet Stop when current supply is completed     [START ON 08/28/2022] clonazePAM (KLONOPIN) 1 MG tablet TAKE THREE TABLETS BY MOUTH EVERY NIGHT AT BEDTIME AND TAKE ONE TO TWO TABLETS BY MOUTH AS NEEDED  FOR ANXIETY **MUST LAST 30 DAYS** 120 tablet 2   Dextromethorphan-buPROPion ER (AUVELITY) 45-105 MG TBCR Take 1 tablet by mouth 2 (two) times daily. 180 tablet 1   fluorouracil (EFUDEX) 5 % cream Apply 1 Application topically 2 (two) times daily.     L-Methylfolate 15 MG TABS Take 1 tablet (15 mg total) by mouth daily. (Patient not taking: Reported on 12/25/2021) 30 tablet 5   loratadine (CLARITIN) 10 MG tablet Take 10 mg by mouth daily as needed. (Patient not taking: Reported on 08/07/2022)     Probiotic Product (PROBIOTIC PO) Take by mouth. (Patient not taking: Reported on 08/07/2022)     SUMAtriptan (IMITREX) 100 MG tablet Take 1 tablet (100 mg total) by mouth once as needed for migraine or headache. May repeat in 2 hours if headache persists or recurs. 10 tablet 1   vitamin C (ASCORBIC ACID) 250 MG tablet Take 250 mg by mouth daily. (Patient not taking: Reported on 10/11/2021)     No current facility-administered medications for this visit.    Medication Side Effects: Denies  Allergies:  Allergies  Allergen Reactions   Onion Other (See Comments)   Tape Other (See Comments)    RASH, PT ALSO STATES RASH WITH COBAN    Past Medical History:  Diagnosis Date   Anxiety    Depression    FHx: malignant neoplasm of breast in first degree relative 07/23/2011   Hyperlipidemia    PTSD (post-traumatic stress disorder)    Thyroid disease     Past Medical History, Surgical history, Social history, and Family history were reviewed and updated as appropriate.   Please see review of systems for further details on the patient's review from today.   Objective:   Physical Exam:  There were no vitals taken for this visit.  Physical Exam Constitutional:      General: She is not in acute distress. Musculoskeletal:        General: No deformity.  Neurological:     Mental Status: She is alert and oriented to person, place, and time.     Coordination: Coordination normal.  Psychiatric:         Attention and Perception: Attention and perception normal. She does not perceive auditory or visual hallucinations.        Mood and Affect: Affect is not labile, blunt, angry or inappropriate.        Speech: Speech normal.        Behavior: Behavior normal.        Thought Content: Thought content normal. Thought content is not paranoid or delusional. Thought content does not include homicidal or suicidal ideation. Thought content does not include homicidal or suicidal plan.        Cognition and Memory: Cognition and memory normal.        Judgment: Judgment normal.     Comments: Insight intact Mood is mildly anxious. Denies depressed mood.  Lab Review:     Component Value Date/Time   NA 138 11/21/2019 1205   NA 140 07/20/2008 1030   K 4.3 11/21/2019 1205   K 3.9 07/20/2008 1030   CL 101 11/21/2019 1205   CL 101 07/20/2008 1030   CO2 30 11/21/2019 1205   CO2 30 07/20/2008 1030   GLUCOSE 98 11/21/2019 1205   GLUCOSE 106 07/20/2008 1030   BUN 11 11/21/2019 1205   BUN 13 07/20/2008 1030   CREATININE 0.65 11/21/2019 1205   CALCIUM 9.9 11/21/2019 1205   CALCIUM 9.2 07/20/2008 1030   PROT 7.0 11/21/2019 1205   PROT 7.5 07/20/2008 1030   ALBUMIN 4.3 07/23/2011 1427   ALBUMIN 3.7 07/20/2008 1030   AST 16 11/21/2019 1205   AST 22 07/20/2008 1030   ALT 14 11/21/2019 1205   ALT 22 07/20/2008 1030   ALKPHOS 55 07/23/2011 1427   ALKPHOS 67 07/20/2008 1030   BILITOT 0.4 11/21/2019 1205   BILITOT 0.50 07/20/2008 1030       Component Value Date/Time   WBC 5.5 11/21/2019 1205   RBC 4.58 11/21/2019 1205   HGB 14.6 11/21/2019 1205   HGB 13.3 07/23/2011 1427   HCT 42.8 11/21/2019 1205   HCT 40.8 07/23/2011 1427   PLT 210 11/21/2019 1205   PLT 196 07/23/2011 1427   MCV 93.4 11/21/2019 1205   MCV 95.0 07/23/2011 1427   MCH 31.9 11/21/2019 1205   MCHC 34.1 11/21/2019 1205   RDW 11.8 11/21/2019 1205   RDW 12.4 07/23/2011 1427   LYMPHSABS 2.3 07/23/2011 1427   MONOABS 0.5  07/23/2011 1427   EOSABS 0.2 07/23/2011 1427   EOSABS 0.2 07/20/2008 1030   BASOSABS 0.1 07/23/2011 1427    No results found for: "POCLITH", "LITHIUM"   Lab Results  Component Value Date   CBMZ 8.4 11/21/2019     .res Assessment: Plan:    39 minutes spent dedicated to the care of this patient on the date of this encounter to include pre-visit review of records, ordering of medication, post visit documentation, and face-to-face time with the patient discussing response to decrease in Rexulti and Carbamazepine. Will decrease Carbamazepine to 200 mg at bedtime for 3 nights, then 100 mg at bedtime for 3 nights, then stop since pt has not experienced any worsening symptoms with dose reduction. Pt reports that she would like to use up current supply of Rexulti and estimates having about a 3 week supply. Will continue Rexulti 2 mg for 3 weeks, then stop. Discussed that this would also separate reductions/discontinuations of Carbamazepine and Rexulti by a few weeks, which would be helpful in determining the effects of each medication change. Discussed that this also allows pt to start reduction around the time of the party she is co-hosting, instead of before the party and possibly have worsening depression.  Will continue all other medications without changes.  Recommend continuing therapy with Stevphen Meuse, [facility].  Pt to follow-up with this provider in 8 weeks or sooner if clinically indicated.  Patient advised to contact office with any questions, adverse effects, or acute worsening in signs and symptoms.    Diana "Arline Asp" was seen today for follow-up.  Diagnoses and all orders for this visit:  Mild episode of recurrent major depressive disorder (HCC) -     brexpiprazole (REXULTI) 2 MG TABS tablet; Stop when current supply is completed  Chronic post-traumatic stress disorder (PTSD) -     clonazePAM (KLONOPIN) 1 MG tablet; TAKE THREE TABLETS BY  MOUTH EVERY NIGHT AT BEDTIME AND TAKE ONE  TO TWO TABLETS BY MOUTH AS NEEDED FOR ANXIETY **MUST LAST 30 DAYS**  Insomnia, unspecified type -     clonazePAM (KLONOPIN) 1 MG tablet; TAKE THREE TABLETS BY MOUTH EVERY NIGHT AT BEDTIME AND TAKE ONE TO TWO TABLETS BY MOUTH AS NEEDED FOR ANXIETY **MUST LAST 30 DAYS**  Other orders -     Carbamazepine (EQUETRO) 100 MG CP12 12 hr capsule; Take 200 mg (2 capsules) at bedtime for 3 nights, then decrease to 100 mg (1 capsule) for 3 nights, then stop     Please see After Visit Summary for patient specific instructions.  Future Appointments  Date Time Provider Department Center  08/18/2022 11:00 AM Stevphen Meuse, [facility] None  09/02/2022  1:00 PM Stevphen Meuse, [facility] None  09/25/2022 11:00 AM Stevphen Meuse, [facility] CP-[facility] None  10/02/2022 10:30 AM Corie Chiquito, PMHNP CP-CP None  10/27/2022 10:00 AM Stevphen Meuse, [facility] CP-[facility] None    No orders of the defined types were placed in this encounter.   -------------------------------

## 2022-08-18 ENCOUNTER — Ambulatory Visit (INDEPENDENT_AMBULATORY_CARE_PROVIDER_SITE_OTHER): Payer: 59 | Admitting: Psychiatry

## 2022-08-18 DIAGNOSIS — F3341 Major depressive disorder, recurrent, in partial remission: Secondary | ICD-10-CM | POA: Diagnosis not present

## 2022-08-18 NOTE — Progress Notes (Signed)
[facility] Counselor/Therapist Progress Note  Diana Ayers ID: Diana Ayers, MRN: [id number],    Date: 08/18/2022  Time Spent: 52 minutes start time 11:00 AM end time 11:52 AM  Virtual Visit via Video Note Connected with Diana Ayers by a telemedicine/telehealth application, with their informed consent, and verified Diana Ayers privacy and that I am speaking with the correct person using two identifiers. I discussed the limitations, risks, security and privacy concerns of performing psychotherapy and the availability of in person appointments. I also discussed with the Diana Ayers that there may be a Diana Ayers responsible charge related to this service. The Diana Ayers expressed understanding and agreed to proceed. I discussed the treatment planning with the Diana Ayers. The Diana Ayers was provided an opportunity to ask questions and all were answered. The Diana Ayers agreed with the plan and demonstrated an understanding of the instructions. The Diana Ayers was advised to call  our office if  symptoms worsen or feel they are in a crisis state and need immediate contact.   Therapist Location:Home Diana Ayers Location: home    Treatment Type: Individual Therapy  Reported Symptoms: fatigue, low motivation, nightmares, anxiety  Mental Status Exam:  Appearance:   Well Groomed     Behavior:  Appropriate  Motor:  Normal  Speech/Language:   Normal Rate  Affect:  Appropriate  Mood:  normal  Thought process:  normal  Thought content:    WNL  Sensory/Perceptual disturbances:    WNL  Orientation:  oriented to person, place, time/date, and situation  Attention:  Good  Concentration:  Good  Memory:  WNL  Fund of knowledge:   Good  Insight:    Good  Judgment:   Good  Impulse Control:  Good   Risk Assessment: Danger to Self:  No Self-injurious Behavior: No Danger to Others: No Duty to Warn:no Physical Aggression / Violence:No  Access to Firearms a concern: No  Gang Involvement:No   Subjective: Met with Diana Ayers  via virtual session. She shared she wanted her notes set as secure so it would not show in Mychart.  She shared she is continuing to try and come off of medications. She shared she is hoping to get some weight off with coming off of things. She is set up to ride her bike inside so she is trying to find ways to increase her exercise. She had an aunt pass away last week, she had Alzheimer's disease. She is still planning her roommate's 59 th Birthday party and there are lots of dynamics that are making it stressful. She went on to share that she is struggling with the weather being extreme heat or raining. She is working on her CBT filters encouraged her to continue doing that.  She is still in her Bible study and going to prepare for attending another one in September. She is also crocheting and making lap blankets for different organizations. She is watching podcasts by Dr. Casilda Carls on trauma. Diana Ayers shares she did have Vertigo. Discussed what part of plans from other sessions she had followed through on as she did that she was able to recognize that she is doing well overall and she needs to continue working on her CBT skills. She is also to continue working on betting her Vitamin D levels up to what is normal.  Interventions: Cognitive Behavioral Therapy and Solution-Oriented/Positive Psychology  Diagnosis:   ICD-10-CM   1. Recurrent major depressive disorder, in partial remission (HCC)  F33.41       Plan:  Diana Ayers is to use  CBT and coping skills to decrease depression symptoms.  Diana Ayers is to continue doing the things that are moving her in a positive direction.  Diana Ayers is to continue working on filter to focus on the things that she can control fix and change and recognize that she may not function like others and that is okay.  Diana Ayers is to participate in her church Bible study and work on developing more relationships outside of the group time.  Diana Ayers is to exercise to release negative  emotions appropriately.  Diana Ayers is to work on information about the neuro cycle by Dr. De Burrs.  Diana Ayers is to take medication as directed.  Diana Ayers is to work with other medical providers on medical issues that are concerning for her.  She is also to research different diets that could be helpful for her. Long-term goal: Elevate mood and show evidence of usual energy activities and socialization level Short-term goal: Identify and replace depressive thinking that leads to depressive feelings and actions  Stevphen Meuse, [facility]

## 2022-08-26 ENCOUNTER — Other Ambulatory Visit: Payer: Self-pay | Admitting: Psychiatry

## 2022-08-26 DIAGNOSIS — F330 Major depressive disorder, recurrent, mild: Secondary | ICD-10-CM

## 2022-08-27 NOTE — Telephone Encounter (Signed)
brexpiprazole (REXULTI) 2 MG TABS tablet; Stop when current supply is completed   Is she still taking?

## 2022-08-28 NOTE — Telephone Encounter (Signed)
LVM to RC 

## 2022-09-02 ENCOUNTER — Ambulatory Visit: Payer: 59 | Admitting: Psychiatry

## 2022-09-03 ENCOUNTER — Other Ambulatory Visit: Payer: Self-pay | Admitting: Psychiatry

## 2022-09-03 DIAGNOSIS — F4312 Post-traumatic stress disorder, chronic: Secondary | ICD-10-CM

## 2022-09-03 DIAGNOSIS — G4700 Insomnia, unspecified: Secondary | ICD-10-CM

## 2022-09-09 ENCOUNTER — Other Ambulatory Visit: Payer: Self-pay | Admitting: Psychiatry

## 2022-09-09 ENCOUNTER — Telehealth: Payer: Self-pay | Admitting: Adult Health

## 2022-09-09 DIAGNOSIS — F332 Major depressive disorder, recurrent severe without psychotic features: Secondary | ICD-10-CM

## 2022-09-09 DIAGNOSIS — F330 Major depressive disorder, recurrent, mild: Secondary | ICD-10-CM

## 2022-09-09 MED ORDER — BREXPIPRAZOLE 1 MG PO TABS: 1.0000 mg | ORAL_TABLET | Freq: Every day | ORAL | Status: DC

## 2022-09-09 MED ORDER — REXULTI 0.5 MG PO TABS
0.5000 mg | ORAL_TABLET | Freq: Every day | ORAL | Status: DC
Start: 2022-09-09 — End: 2022-10-02

## 2022-09-09 NOTE — Telephone Encounter (Signed)
Recommend re-starting Rexulti 0.5 mg daily for one week, then increase to 1 mg daily. Please pull starter pack of Rexulti for her. Please ask her to contact office in about 12-14 days with update on how she is feeling after re-starting Rexulti.

## 2022-09-09 NOTE — Telephone Encounter (Signed)
Pt has been off carbamazepine about 1-1/2 months and Rexulti about a month. She said you told her the Rexulti would be in her system for 26 days and thinks she is at this point. She is reporting low energy and no motivation, "could sit on the couch all day". Stated had been struggling to get her vitamin D level up and coming off the carbamazepine was supposed to be helpful with that. She is sleeping well. No new stressors. She was not tearful and affect did not appear to be flat. Rates depression as 6/10, anxiety 7/10 - she reports being anxious all the time and the change in medications has made her more anxious.  Continues to see [facility] for therapy. Has FU with you and Holly next month.

## 2022-09-09 NOTE — Telephone Encounter (Signed)
Samples pulled, patient notified. Wrote down the instructions and put those in with samples as well.

## 2022-09-09 NOTE — Telephone Encounter (Signed)
Patient sees J. Montez Morita.  Making some medication changes and patient is not doing well. Not sure if there are any tweaks. Energy level low.

## 2022-09-18 ENCOUNTER — Ambulatory Visit (INDEPENDENT_AMBULATORY_CARE_PROVIDER_SITE_OTHER): Payer: 59 | Admitting: Psychiatry

## 2022-09-18 DIAGNOSIS — F331 Major depressive disorder, recurrent, moderate: Secondary | ICD-10-CM | POA: Diagnosis not present

## 2022-09-18 NOTE — Progress Notes (Signed)
[facility] Counselor/Therapist Progress Note  Patient ID: SENG FOPPE, MRN: [id number],    Date: 09/18/2022  Time Spent: 61 minutes start time 11:01 AM end time 12 and 2 PM Virtual Visit via Video Note Connected with patient by a telemedicine/telehealth application, with their informed consent, and verified patient privacy and that I am speaking with the correct person using two identifiers. I discussed the limitations, risks, security and privacy concerns of performing psychotherapy and the availability of in person appointments. I also discussed with the patient that there may be a patient responsible charge related to this service. The patient expressed understanding and agreed to proceed. I discussed the treatment planning with the patient. The patient was provided an opportunity to ask questions and all were answered. The patient agreed with the plan and demonstrated an understanding of the instructions. The patient was advised to call  our office if  symptoms worsen or feel they are in a crisis state and need immediate contact.   Therapist Location: office Patient Location: home    Treatment Type: Individual Therapy  Reported Symptoms: fatigue, low motivation, anxiety, sadness, triggered responses, rumination, triggered responses  Mental Status Exam:  Appearance:   Well Groomed     Behavior:  Appropriate  Motor:  Normal  Speech/Language:   Normal Rate  Affect:  Appropriate  Mood:  labile  Thought process:  normal  Thought content:    WNL  Sensory/Perceptual disturbances:    WNL  Orientation:  oriented to person, place, time/date, situation, and day of week  Attention:  Good  Concentration:  Good  Memory:  Immediate;   Fair  Fund of knowledge:   Good  Insight:    Good  Judgment:   Good  Impulse Control:  Good   Risk Assessment: Danger to Self:  No Self-injurious Behavior: No Danger to Others: No Duty to Warn:no Physical Aggression / Violence:No  Access  to Firearms a concern: No  Gang Involvement:No   Subjective: Met with patient via virtual session. She shared she is not doing well.  She explained she has been going through med changes but still has no motivation.patient was given opportunity to share her frustration with the extreme fatigue she is feeling and how she is not accomplishing anything.  Patient stated that her roommate is expressing concerns for her as well.  She shares she is hopeful that the med changes will make a difference if not she will be going to talk to her primary care physician due to the extreme frustration.  She is doing her Bible study at her home and they may be doing another one soon which is good. She shared her mother's [place] Ouch was yesterday and she just didn't have the energy to go see her.  She shared she is having her roommate drop into her mother for the weekend so that they can celebrate.  She expressed concern that her mother will not be okay with her being as lethargic as she is.  Discussed ways to manage that.  She has been able to read/listen to books on tape encouraged her to continue finding podcast she can listen to regarding trauma in the brain and ways to help her find. Discussed how to try and do minute exercises/or tasks every 30 minutes to try and get a little movement throughout the day.  Patient did share that she has been reading a lot of the Old Testament and if they are there has been a lot of blood  with what has occurred and that tends to trigger her.  Encouraged her to start trying to put more positive into her head just to see if that will make any difference.  She will be starting the New Testament things that hopefully will be more positive and an uplifting for her.  Patient was encouraged to start getting into sessions more frequently since it had been a long time since last session.  Also agreed to let her provider Corie Chiquito, PMHNP know that she would like to have contact with her next  week.  Interventions: Cognitive Behavioral Therapy, Solution-Oriented/Positive Psychology, and Insight-Oriented  Diagnosis:   ICD-10-CM   1. Major depressive disorder, recurrent episode, moderate (HCC)  F33.1       Plan:  Patient is to use CBT and coping skills to decrease depression symptoms.  Patient is to work on plans from session to try and to 1 minute exercise/chore every 30 minutes to start getting more movement in her body.  She is also and work on listening to content on trauma and the brain and ways to develop.  Patient is to continue working on filter to focus on the things that she can control fix and change and recognize that she may not function like others and that is okay.  Patient is to participate in her church Bible study and work on developing more relationships outside of the group time.  Patient is to exercise to release negative emotions appropriately.  Patient is to work on information about the neuro cycle by Dr. De Burrs.  Patient is to take medication as directed.  Patient is to work with other medical providers on medical issues that are concerning for her.  She is also to research different diets that could be helpful for her. Long-term goal: Elevate mood and show evidence of usual energy activities and socialization level Short-term goal: Identify and replace depressive thinking that leads to depressive feelings and actions  Stevphen Meuse, [facility]

## 2022-09-23 ENCOUNTER — Telehealth: Payer: Self-pay

## 2022-09-23 DIAGNOSIS — F332 Major depressive disorder, recurrent severe without psychotic features: Secondary | ICD-10-CM

## 2022-09-23 MED ORDER — BREXPIPRAZOLE 1 MG PO TABS: 1.0000 mg | ORAL_TABLET | Freq: Every day | ORAL | 0 refills | Status: DC

## 2022-09-23 NOTE — Telephone Encounter (Signed)
Patient notified of recommendations. 

## 2022-09-23 NOTE — Telephone Encounter (Signed)
Patient called to report on how she is doing since restarting Rexulti. She says she feels about 10% better. Improvement in energy and motivation. No issues with sleep. Depression about the same. Has FU 9/12.   Note from 8/20:  Recommend re-starting Rexulti 0.5 mg daily for one week, then increase to 1 mg daily. Please pull starter pack of Rexulti for her. Please ask her to contact office in about 12-14 days with update on how she is feeling after re-starting Rexulti.

## 2022-09-25 ENCOUNTER — Ambulatory Visit: Payer: 59 | Admitting: Psychiatry

## 2022-10-02 ENCOUNTER — Encounter: Payer: Self-pay | Admitting: Psychiatry

## 2022-10-02 ENCOUNTER — Ambulatory Visit: Payer: 59 | Admitting: Psychiatry

## 2022-10-02 DIAGNOSIS — G4700 Insomnia, unspecified: Secondary | ICD-10-CM

## 2022-10-02 DIAGNOSIS — Z79899 Other long term (current) drug therapy: Secondary | ICD-10-CM

## 2022-10-02 DIAGNOSIS — F4312 Post-traumatic stress disorder, chronic: Secondary | ICD-10-CM

## 2022-10-02 DIAGNOSIS — F331 Major depressive disorder, recurrent, moderate: Secondary | ICD-10-CM

## 2022-10-02 DIAGNOSIS — F330 Major depressive disorder, recurrent, mild: Secondary | ICD-10-CM

## 2022-10-02 DIAGNOSIS — R5383 Other fatigue: Secondary | ICD-10-CM

## 2022-10-02 MED ORDER — CLONAZEPAM 1 MG PO TABS
ORAL_TABLET | ORAL | 2 refills | Status: DC
Start: 2022-10-02 — End: 2022-12-25

## 2022-10-02 MED ORDER — AUVELITY 45-105 MG PO TBCR
1.0000 | EXTENDED_RELEASE_TABLET | Freq: Two times a day (BID) | ORAL | 2 refills | Status: DC
Start: 2022-10-02 — End: 2022-10-30

## 2022-10-02 MED ORDER — TRAZODONE HCL 100 MG PO TABS
ORAL_TABLET | ORAL | 1 refills | Status: DC
Start: 2022-10-02 — End: 2022-12-25

## 2022-10-02 MED ORDER — CARIPRAZINE HCL 1.5 MG PO CAPS
1.5000 mg | ORAL_CAPSULE | Freq: Every day | ORAL | Status: DC
Start: 2022-10-02 — End: 2022-10-30

## 2022-10-02 MED ORDER — VENLAFAXINE HCL ER 150 MG PO CP24
300.0000 mg | ORAL_CAPSULE | Freq: Every day | ORAL | 1 refills | Status: DC
Start: 2022-10-02 — End: 2022-12-25

## 2022-10-02 NOTE — Progress Notes (Signed)
Diana Ayers [id number] [birth year] 62 y.o.  Subjective:   Patient ID:  Diana Ayers is a 62 y.o. (DOB October 16, 1960) female.  Chief Complaint:  Chief Complaint  Patient presents with   Fatigue   Other    Low motivation, diminished interest    HPI Diana Ayers presents to the office today for follow-up of anxiety, depression, insomnia.   She reports that her depression worsened about 3-4 weeks after discontinuation of Rexulti. She reports that her mood has improved with restarting Rexulti. Denies sad mood at this time. She reports low energy and low motivation. Low interest in things. She reports some socialization with roommate and roommate's friends. She has reached out to some friends and friends have not been available. Sleeping ok. Anxiety is "not too bad." Does not recall any recent PTSD s/s to include nightmares, flashbacks, or intrusive memories. She reports that her appetite is increased. She reports that she has gained weight. Denies SI.   Went fishing Saturday for the first time in months. She is starting a Bible study next Monday that will be every other week. Reports that she has started walking again.   She denies any changes in mood or anxiety with decrease in Carbamazepine.   Taking Klonopin 2.5 mg at bedtime.   Past Psychiatric Medication Trials: Klonopin- Reports that she was once on 6 mg total daily dose and has decreased to 3 mg. Reports Klonopin has been helpful for anxiety.  Trazodone- Effective. Has some excessive daytime somnolence. Latuda Rexulti Effexor XR- Has been helpful for mood and anxiety.  Prozac Paxil Zoloft Wellbutrin Auvelity Carbamazepine XR- Has been helpful for episodic agitation/irritability/aggression Depakote- Helpful  Nortriptyline  AIMS    Flowsheet Row Office Visit from 07/08/2022 in [facility] Video Visit from 05/06/2022 in [facility] Office Visit from  12/25/2021 in [facility] Video Visit from 08/13/2021 in [facility] Office Visit from 03/07/2021 in [facility]  AIMS Total Score 0 0 1 0 0      [id number]    Flowsheet Row Nutrition from 05/05/2017 in [facility] from 08/03/2014 in [facility] from 06/16/2014 in [facility] Total Score 0 6 3        Review of Systems:  Review of Systems  Musculoskeletal:  Negative for gait problem.  Neurological:        Recent vertigo  Psychiatric/Behavioral:         Please refer to HPI   Reports falling at night recently when getting up to use the bathroom while experiencing vertigo. Sustained some bruises.   Medications: I have reviewed the patient's current medications.  Current Outpatient Medications  Medication Sig Dispense Refill   cariprazine (VRAYLAR) 1.5 MG capsule Take 1 capsule (1.5 mg total) by mouth daily.     MELATONIN PO Take 8 mg by mouth at bedtime.     rosuvastatin (CRESTOR) 20 MG tablet Take 20 mg by mouth at bedtime.     Vitamin D, Ergocalciferol, (DRISDOL) 1.25 MG (50000 UNIT) CAPS capsule Take 1 capsule (50,000 Units total) by mouth once a week. 4 capsule 2   clonazePAM (KLONOPIN) 1 MG tablet TAKE 3 TABLETS BY MOUTH EVERY NIGHT AT BEDTIME AND TAKE 1 TO 2 TABLETS BY MOUTH AS NEEDED FOR ANXIETY **MUST LAST  30 DAYS** 120 tablet 2   Dextromethorphan-buPROPion ER (AUVELITY) 45-105 MG TBCR Take 1 tablet by mouth 2 (two) times daily. 60 tablet 2   diclofenac Sodium (VOLTAREN) 1 % GEL as needed.     fluorouracil (EFUDEX) 5 % cream Apply 1 Application topically 2 (two) times daily.     L-Methylfolate 15 MG TABS Take 1 tablet (15 mg total) by mouth daily. (Patient not taking: Reported on 12/25/2021) 30 tablet 5   loratadine  (CLARITIN) 10 MG tablet Take 10 mg by mouth daily as needed. (Patient not taking: Reported on 08/07/2022)     Probiotic Product (PROBIOTIC PO) Take by mouth. (Patient not taking: Reported on 08/07/2022)     SUMAtriptan (IMITREX) 100 MG tablet Take 1 tablet (100 mg total) by mouth once as needed for migraine or headache. May repeat in 2 hours if headache persists or recurs. 10 tablet 1   traZODone (DESYREL) 100 MG tablet Take 2-3 tabs po QHS 270 tablet 1   venlafaxine XR (EFFEXOR XR) 150 MG 24 hr capsule Take 2 capsules (300 mg total) by mouth daily with breakfast. 180 capsule 1   vitamin C (ASCORBIC ACID) 250 MG tablet Take 250 mg by mouth daily. (Patient not taking: Reported on 10/11/2021)     No current facility-administered medications for this visit.    Medication Side Effects: Other: Possible wt gain with Rexulti  Allergies:  Allergies  Allergen Reactions   Onion Other (See Comments)   Tape Other (See Comments)    RASH, PT ALSO STATES RASH WITH COBAN    Past Medical History:  Diagnosis Date   Anxiety    Depression    FHx: malignant neoplasm of breast in first degree relative 07/23/2011   Hyperlipidemia    PTSD (post-traumatic stress disorder)    Thyroid disease     Past Medical History, Surgical history, Social history, and Family history were reviewed and updated as appropriate.   Please see review of systems for further details on the patient's review from today.   Objective:   Physical Exam:  There were no vitals taken for this visit.  Physical Exam Constitutional:      General: She is not in acute distress. Musculoskeletal:        General: No deformity.  Neurological:     Mental Status: She is alert and oriented to person, place, and time.     Coordination: Coordination normal.  Psychiatric:        Attention and Perception: Attention and perception normal. She does not perceive auditory or visual hallucinations.        Mood and Affect: Mood is not anxious. Affect  is flat. Affect is not labile, blunt, angry or inappropriate.        Speech: Speech normal.        Behavior: Behavior is slowed. Behavior is cooperative.        Thought Content: Thought content normal. Thought content is not paranoid or delusional. Thought content does not include homicidal or suicidal ideation. Thought content does not include homicidal or suicidal plan.        Cognition and Memory: Cognition and memory normal.        Judgment: Judgment normal.     Comments: Insight intact Mood presents as mildly depressed     Lab Review:     Component Value Date/Time   NA 138 11/21/2019 1205   NA 140 07/20/2008 1030   K 4.3 11/21/2019 1205   K 3.9 07/20/2008 1030  CL 101 11/21/2019 1205   CL 101 07/20/2008 1030   CO2 30 11/21/2019 1205   CO2 30 07/20/2008 1030   GLUCOSE 98 11/21/2019 1205   GLUCOSE 106 07/20/2008 1030   BUN 11 11/21/2019 1205   BUN 13 07/20/2008 1030   CREATININE 0.65 11/21/2019 1205   CALCIUM 9.9 11/21/2019 1205   CALCIUM 9.2 07/20/2008 1030   PROT 7.0 11/21/2019 1205   PROT 7.5 07/20/2008 1030   ALBUMIN 4.3 07/23/2011 1427   ALBUMIN 3.7 07/20/2008 1030   AST 16 11/21/2019 1205   AST 22 07/20/2008 1030   ALT 14 11/21/2019 1205   ALT 22 07/20/2008 1030   ALKPHOS 55 07/23/2011 1427   ALKPHOS 67 07/20/2008 1030   BILITOT 0.4 11/21/2019 1205   BILITOT 0.50 07/20/2008 1030       Component Value Date/Time   WBC 5.5 11/21/2019 1205   RBC 4.58 11/21/2019 1205   HGB 14.6 11/21/2019 1205   HGB 13.3 07/23/2011 1427   HCT 42.8 11/21/2019 1205   HCT 40.8 07/23/2011 1427   PLT 210 11/21/2019 1205   PLT 196 07/23/2011 1427   MCV 93.4 11/21/2019 1205   MCV 95.0 07/23/2011 1427   MCH 31.9 11/21/2019 1205   MCHC 34.1 11/21/2019 1205   RDW 11.8 11/21/2019 1205   RDW 12.4 07/23/2011 1427   LYMPHSABS 2.3 07/23/2011 1427   MONOABS 0.5 07/23/2011 1427   EOSABS 0.2 07/23/2011 1427   EOSABS 0.2 07/20/2008 1030   BASOSABS 0.1 07/23/2011 1427    No results  found for: "POCLITH", "LITHIUM"   Lab Results  Component Value Date   CBMZ 8.4 11/21/2019     .res Assessment: Plan:    40 minutes spent dedicated to the care of this patient on the date of this encounter to include pre-visit review of records, ordering of medication, post visit documentation, and face-to-face time with the patient discussing response to Rexulti and possible side effects, and possible treatment options. Discussed that Rexulti has been helpful for sad mood, however her motivation and interest have been unimproved with Rexulti and possibly worsened, and weight gain seems to possibly correlate with Rexulti. Discussed changing Rexulti to Vraylar, since it has a similar mechanism of action with less risk of weight gain and it tends to be more activating. Discussed potential benefits, risks, and side effects of Vraylar. Reviewed potential metabolic side effects associated with atypical antipsychotics, as well as potential risk for movement side effects. Advised pt to contact office if movement side effects occur. Pt agrees to trial of Vraylar. Will start Vraylar 1.5 mg daily.  Pt advised to stop Rexulti.  Will continue Effexor XR 300 mg daily for depression and anxiety.  Continue Trazodone as needed for insomnia.  Continue Klonopin for insomnia and anxiety.  Will continue Auvelity 45-105 mg one tablet twice daily for depression. She denies any worsening symptoms since decreasing and discontinuing Carbamazepine. Will order CBC and Hgb A1C to assess for adverse effects with medications.  Recommend continuing therapy with Stevphen Meuse, [facility].  Pt to follow-up with this provider in 4 weeks or sooner if clinically indicated.  Patient advised to contact office with any questions, adverse effects, or acute worsening in signs and symptoms.   Diana "Arline Asp" was seen today for fatigue and other.  Diagnoses and all orders for this visit:  Major depressive disorder, recurrent episode,  moderate (HCC) -     cariprazine (VRAYLAR) 1.5 MG capsule; Take 1 capsule (1.5 mg total) by mouth daily. -  Dextromethorphan-buPROPion ER (AUVELITY) 45-105 MG TBCR; Take 1 tablet by mouth 2 (two) times daily. -     venlafaxine XR (EFFEXOR XR) 150 MG 24 hr capsule; Take 2 capsules (300 mg total) by mouth daily with breakfast. -     traZODone (DESYREL) 100 MG tablet; Take 2-3 tabs po QHS  Chronic post-traumatic stress disorder (PTSD) -     clonazePAM (KLONOPIN) 1 MG tablet; TAKE 3 TABLETS BY MOUTH EVERY NIGHT AT BEDTIME AND TAKE 1 TO 2 TABLETS BY MOUTH AS NEEDED FOR ANXIETY **MUST LAST 30 DAYS** -     venlafaxine XR (EFFEXOR XR) 150 MG 24 hr capsule; Take 2 capsules (300 mg total) by mouth daily with breakfast. -     traZODone (DESYREL) 100 MG tablet; Take 2-3 tabs po QHS  Fatigue, unspecified type -     Cancel: CBC  High risk medication use -     Hemoglobin A1c -     CBC  Insomnia, unspecified type -     clonazePAM (KLONOPIN) 1 MG tablet; TAKE 3 TABLETS BY MOUTH EVERY NIGHT AT BEDTIME AND TAKE 1 TO 2 TABLETS BY MOUTH AS NEEDED FOR ANXIETY **MUST LAST 30 DAYS**     Please see After Visit Summary for patient specific instructions.  Future Appointments  Date Time Provider Department Center  10/27/2022 10:00 AM Stevphen Meuse, [facility] None  10/30/2022 11:00 AM Corie Chiquito, PMHNP CP-CP None  11/11/2022 10:00 AM Stevphen Meuse, [facility] None  12/02/2022 11:00 AM Stevphen Meuse, [facility] None  12/23/2022 11:00 AM Stevphen Meuse, [facility] None    Orders Placed This Encounter  Procedures   Hemoglobin A1c   CBC    -------------------------------

## 2022-10-09 LAB — HEMOGLOBIN A1C
Hgb A1c MFr Bld: 6 % of total Hgb — ABNORMAL HIGH (ref <5.7)
Mean Plasma Glucose: 126 mg/dL
eAG (mmol/L): 7 mmol/L

## 2022-10-09 LAB — CBC
HCT: 41.4 % (ref 35.0–45.0)
Hemoglobin: 14 g/dL (ref 11.7–15.5)
MCH: 31.9 pg (ref 27.0–33.0)
MCHC: 33.8 g/dL (ref 32.0–36.0)
MCV: 94.3 fL (ref 80.0–100.0)
MPV: 12.7 fL — ABNORMAL HIGH (ref 7.5–12.5)
Platelets: 189 10*3/uL (ref 140–400)
RBC: 4.39 10*6/uL (ref 3.80–5.10)
RDW: 11.4 % (ref 11.0–15.0)
WBC: 6.1 10*3/uL (ref 3.8–10.8)

## 2022-10-27 ENCOUNTER — Ambulatory Visit: Payer: 59 | Admitting: Psychiatry

## 2022-10-27 ENCOUNTER — Other Ambulatory Visit: Payer: Self-pay | Admitting: Psychiatry

## 2022-10-27 DIAGNOSIS — F4310 Post-traumatic stress disorder, unspecified: Secondary | ICD-10-CM

## 2022-10-27 DIAGNOSIS — E559 Vitamin D deficiency, unspecified: Secondary | ICD-10-CM
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
## 2022-10-30 ENCOUNTER — Encounter: Payer: Self-pay | Admitting: Psychiatry

## 2022-10-30 ENCOUNTER — Ambulatory Visit: Payer: 59 | Admitting: Psychiatry

## 2022-10-30 DIAGNOSIS — F4310 Post-traumatic stress disorder, unspecified: Secondary | ICD-10-CM | POA: Diagnosis not present

## 2022-10-30 DIAGNOSIS — G4700 Insomnia, unspecified: Secondary | ICD-10-CM

## 2022-10-30 DIAGNOSIS — F3341 Major depressive disorder, recurrent, in partial remission: Secondary | ICD-10-CM | POA: Diagnosis not present

## 2022-10-30 MED ORDER — CARIPRAZINE HCL 1.5 MG PO CAPS
1.5000 mg | ORAL_CAPSULE | Freq: Every day | ORAL | 2 refills | Status: DC
Start: 2022-10-30 — End: 2022-12-25

## 2022-10-30 MED ORDER — AUVELITY 45-105 MG PO TBCR
1.0000 | EXTENDED_RELEASE_TABLET | Freq: Two times a day (BID) | ORAL | 2 refills | Status: DC
Start: 2022-10-30 — End: 2022-12-25
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
## 2022-11-11 ENCOUNTER — Ambulatory Visit: Payer: 59 | Admitting: Psychiatry

## 2022-11-11 DIAGNOSIS — F4310 Post-traumatic stress disorder, unspecified: Secondary | ICD-10-CM | POA: Diagnosis not present
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
## 2022-11-25 ENCOUNTER — Other Ambulatory Visit: Payer: Self-pay | Admitting: Psychiatry

## 2022-11-25 DIAGNOSIS — E559 Vitamin D deficiency, unspecified: Secondary | ICD-10-CM

## 2022-12-02 ENCOUNTER — Ambulatory Visit (INDEPENDENT_AMBULATORY_CARE_PROVIDER_SITE_OTHER): Payer: 59 | Admitting: Psychiatry

## 2022-12-02 DIAGNOSIS — F4310 Post-traumatic stress disorder, unspecified: Secondary | ICD-10-CM | POA: Diagnosis not present

## 2022-12-02 NOTE — Progress Notes (Unsigned)
[facility] Counselor/Therapist Progress Note  Patient ID: Diana Ayers, MRN: [id number],    Date: 12/02/2022  Time Spent: 51 minutes start time 11:03 AM end time 11:54 AM Virtual Visit via Telehealth Note Connected with patient by a telemedicine/telehealth application, with their informed consent, and verified patient privacy and that I am speaking with the correct person using two identifiers. I discussed the limitations, risks, security and privacy concerns of performing psychotherapy and the availability of in person appointments. I also discussed with the patient that there may be a patient responsible charge related to this service. The patient expressed understanding and agreed to proceed. I discussed the treatment planning with the patient. The patient was provided an opportunity to ask questions and all were answered. The patient agreed with the plan and demonstrated an understanding of the instructions. The patient was advised to call  our office if  symptoms worsen or feel they are in a crisis state and need immediate contact.   Therapist Location: home Patient Location: home    Treatment Type: Individual Therapy  Reported Symptoms: anxiety, sadness, triggered responses, flashbacks, rumination  Mental Status Exam:  Appearance:   Casual     Behavior:  Appropriate  Motor:  Normal  Speech/Language:   Normal Rate  Affect:  Appropriate  Mood:  anxious  Thought process:  normal  Thought content:    WNL  Sensory/Perceptual disturbances:    WNL  Orientation:  oriented to person, place, time/date, and situation  Attention:  Good  Concentration:  Good  Memory:  WNL  Fund of knowledge:   Good  Insight:    Good  Judgment:   Good  Impulse Control:  Good   Risk Assessment: Danger to Self:  No Self-injurious Behavior: No Danger to Others: No Duty to Warn:no Physical Aggression / Violence:No  Access to Firearms a concern: No  Gang Involvement:No   Subjective:  Met with patient via virtual session. She shared that she is going through a pipe issue and does not have water which is stressful. She went on to share that she is also starting weight loss injections due to her weight gains.  She shared she did go by a spot of an old trauma that brought up memories. She went on to share she is trying to cut back on the news so she doesn't let her mind go to negative things. She shared she is trying to redirect her thoughts. She shared that she feels her PTSD is doing better and she is trying to work on limiting her triggers the best she can. She went on to share she did go to [place] with her sisters and her mother got very upset. Patient went on to share she was feeling that she can't go on anymore trips until she takes her mother somewhere. Challenged patient's thinking and she was able to recognize what is hers and what is her mom's. Encouraged her to think through CBT skills to help her manage when her mother is critical and upset.  Patient agreed to make sure she does what she needs with her friends and her sisters without feeling guilty about what is going on with her mom especially what she was able to think about her mother's choices and some of what is happening are just consequences for them.  Interventions: Cognitive Behavioral Therapy, Solution-Oriented/Positive Psychology, and Insight-Oriented  Diagnosis:   ICD-10-CM   1. PTSD (post-traumatic stress disorder)  F43.10       Plan:  Patient is to use CBT and coping skills to decrease triggered responses.  Patient is to work on reminding herself that her mother is accountable for her choices and she is not to fix things for her.  Patient is to go off on her own with her friends and siblings as she wants.  Patient is to start writing the negative memories from the past and releasing them in an appropriate manner.  Patient is to participate in her church Bible study and work on developing more relationships  outside of the group time.  Patient is to exercise to release negative emotions appropriately.  Patient is to work on information about the neuro cycle by Dr. De Burrs.  Patient is to take medication as directed.  Patient is to work with other medical providers on medical issues that are concerning for her.  She is also to research different diets that could be helpful for her.   Stevphen Meuse, [facility]

## 2022-12-03 ENCOUNTER — Encounter: Payer: Self-pay | Admitting: Psychiatry

## 2022-12-23 ENCOUNTER — Ambulatory Visit: Payer: 59 | Admitting: Psychiatry

## 2022-12-23 DIAGNOSIS — F3341 Major depressive disorder, recurrent, in partial remission: Secondary | ICD-10-CM | POA: Diagnosis not present

## 2022-12-23 NOTE — Progress Notes (Unsigned)
[facility] Counselor/Therapist Progress Note  Patient ID: Diana Ayers, MRN: [id number],    Date: 12/23/2022  Time Spent: 54 minutes start time 11:01 AM end time 11:55 AM Virtual Visit via Video Note Connected with patient by a telemedicine/telehealth application, with their informed consent, and verified patient privacy and that I am speaking with the correct person using two identifiers. I discussed the limitations, risks, security and privacy concerns of performing psychotherapy and the availability of in person appointments. I also discussed with the patient that there may be a patient responsible charge related to this service. The patient expressed understanding and agreed to proceed. I discussed the treatment planning with the patient. The patient was provided an opportunity to ask questions and all were answered. The patient agreed with the plan and demonstrated an understanding of the instructions. The patient was advised to call  our office if  symptoms worsen or feel they are in a crisis state and need immediate contact.   Therapist Location: Home  patient Location: home    Treatment Type: Individual Therapy  Reported Symptoms: fatigue, low motivation, sadness, triggered responses  Mental Status Exam:  Appearance:   Well Groomed     Behavior:  Appropriate  Motor:  Normal  Speech/Language:   Normal Rate  Affect:  Appropriate  Mood:  labile  Thought process:  normal  Thought content:    WNL  Sensory/Perceptual disturbances:    WNL  Orientation:  oriented to person, place, time/date, and situation  Attention:  Good  Concentration:  Good  Memory:  WNL  Fund of knowledge:   Good  Insight:    Good  Judgment:   Good  Impulse Control:  Good   Risk Assessment: Danger to Self:  No Self-injurious Behavior: No Danger to Others: No Duty to Warn:no Physical Aggression / Violence:No  Access to Firearms a concern: No  Gang Involvement:No   Subjective: Met with  patient via virtual session. She shared that her mood has been mellow. She is losing weight but not exercising so that is hard.She shared she had plumbing issues that were costly and her truck is having issues so that is hard as well. She went on to share she has been using cognitive re framing to help her manage it all. She saw her mother over the [place] and that was triggering for her. Discussed how to get some exercise worked into her life more regularly. She was able to realize that she can put her bike in an area that would helpful to get into a routine.  She developed a plan to be able to follow through with setting up the bike so that she can get into a regular routine.  Also encouraged her to realize the things that were in the way of her plan currently and figure out CBT skills to help her get to the other side of things.  Patient did share she is continuing to make progress with connecting with other people and recognizes that is impacting her mood.  She is not aware of her roommate is and her roommate sometimes gets upset about that.  Encouraged her to recognize how far she has come and to realize that the only perspective that is important is hers.    Interventions: Cognitive Behavioral Therapy and Motivational Interviewing  Diagnosis:   ICD-10-CM   1. Recurrent major depressive disorder, in partial remission (HCC)  F33.41       Plan:  Patient is to use CBT  and coping skills to decrease depression symptoms.  Patient is to work on plans from session to get into a healthy exercise routine.  She is also and work on listening to content on trauma and the brain and ways to develop.  Patient is to continue working on filter to focus on the things that she can control fix and change and recognize that she may not function like others and that is okay.  Patient is to participate in her church Bible study and work on developing more relationships outside of the group time.  Patient is to exercise to  release negative emotions appropriately.  Patient is to work on information about the neuro cycle by Dr. De Burrs.  Patient is to take medication as directed.  Patient is to work with other medical providers on medical issues that are concerning for her.  She is also to research different diets that could be helpful for her. Long-term goal: Elevate mood and show evidence of usual energy activities and socialization level Short-term goal: Identify and replace depressive thinking that leads to depressive feelings and actions  Stevphen Meuse, [facility]

## 2022-12-24 ENCOUNTER — Ambulatory Visit: Payer: 59 | Admitting: Psychiatry

## 2022-12-25 ENCOUNTER — Encounter: Payer: Self-pay | Admitting: Psychiatry

## 2022-12-25 ENCOUNTER — Ambulatory Visit: Payer: 59 | Admitting: Psychiatry

## 2022-12-25 DIAGNOSIS — F4312 Post-traumatic stress disorder, chronic: Secondary | ICD-10-CM

## 2022-12-25 DIAGNOSIS — G4700 Insomnia, unspecified: Secondary | ICD-10-CM

## 2022-12-25 DIAGNOSIS — F331 Major depressive disorder, recurrent, moderate: Secondary | ICD-10-CM

## 2022-12-25 DIAGNOSIS — F3341 Major depressive disorder, recurrent, in partial remission: Secondary | ICD-10-CM | POA: Diagnosis not present

## 2022-12-25 MED ORDER — VENLAFAXINE HCL ER 150 MG PO CP24: 300.0000 mg | ORAL_CAPSULE | Freq: Every day | ORAL | 1 refills | Status: AC

## 2022-12-25 MED ORDER — CARIPRAZINE HCL 1.5 MG PO CAPS: 1.5000 mg | ORAL_CAPSULE | Freq: Every day | ORAL | 2 refills | Status: AC

## 2022-12-25 MED ORDER — TRAZODONE HCL 100 MG PO TABS: ORAL_TABLET | ORAL | 1 refills | Status: AC

## 2022-12-25 MED ORDER — CLONAZEPAM 1 MG PO TABS: ORAL_TABLET | ORAL | 5 refills | Status: AC

## 2022-12-25 MED ORDER — AUVELITY 45-105 MG PO TBCR: 1.0000 | EXTENDED_RELEASE_TABLET | Freq: Two times a day (BID) | ORAL | 5 refills | Status: AC

## 2022-12-25 NOTE — Progress Notes (Signed)
Diana Ayers [id number] 08/16/1960 62 y.o.  Subjective:   Patient ID:  Diana Ayers is a 62 y.o. (DOB 1960/03/22) female.  Chief Complaint:  Chief Complaint  Patient presents with   Follow-up    Depression, anxiety    HPI Diana Ayers presents to the office today for follow-up of depression and anxiety. Diana Ayers reports being "more laid back" compared to roommate. Diana Ayers reports that Diana Ayers has some socialization. Diana Ayers reports slightly lower mood around this time this year. Diana Ayers reports missing her father. Diana Ayers reports energy and motivation are slightly lower. Enjoys spending time with friends. Diana Ayers reports difficulty with concentration at times. Denies SI.   Diana Ayers is now on Zepbound and notices some weight loss and fatigue. Diana Ayers has been eating high protein meals and plans to cycle indoors.   Diana Ayers is in the process of changing churches and reports this is a "positive thing."   Diana Ayers reports that her cats are very loving and a source of comfort for her.   Klonopin last filled 12/20/22 x 3.  Past Psychiatric Medication Trials: Klonopin- Reports that Diana Ayers was once on 6 mg total daily dose and has decreased to 3 mg. Reports Klonopin has been helpful for anxiety.  Trazodone- Effective. Has some excessive daytime somnolence. Latuda Rexulti Effexor XR- Has been helpful for mood and anxiety.  Prozac Paxil Zoloft Wellbutrin Auvelity Carbamazepine XR- Has been helpful for episodic agitation/irritability/aggression Depakote- Helpful  Nortriptyline  AIMS    Flowsheet Row Office Visit from 12/25/2022 in [facility] Office Visit from 10/30/2022 in [facility] Office Visit from 07/08/2022 in [facility] Video Visit from 05/06/2022 in [facility] Office Visit from 12/25/2021 in [facility]  AIMS Total Score 0 0 0 0 1      PHQ2-9    Flowsheet  Row Nutrition from 05/05/2017 in [facility]  - A Dept Of [place]. [facility] Nutrition from 08/03/2014 in [facility]  - A Dept Of [place]. [facility] Nutrition from 06/16/2014 in [facility]  - A Dept Of [facility]. [facility]  PHQ-2 Total Score 0 6 3        Review of Systems:  Review of Systems  Gastrointestinal:  Positive for constipation.  Musculoskeletal:  Negative for gait problem.  Neurological:  Negative for tremors.  Psychiatric/Behavioral:         Please refer to HPI    Medications: I have reviewed the patient's current medications.  Current Outpatient Medications  Medication Sig Dispense Refill   ZEPBOUND 2.5 MG/0.5ML Pen Inject 2.5 mg into the skin once a week.     cariprazine (VRAYLAR) 1.5 MG capsule Take 1 capsule (1.5 mg total) by mouth daily. 30 capsule 2   [START ON 01/17/2023] clonazePAM (KLONOPIN) 1 MG tablet TAKE 3 TABLETS BY MOUTH EVERY NIGHT AT BEDTIME AND TAKE 1 TO 2 TABLETS BY MOUTH AS NEEDED FOR ANXIETY **MUST LAST 30 DAYS** 120 tablet 5   Dextromethorphan-buPROPion ER (AUVELITY) 45-105 MG TBCR Take 1 tablet by mouth 2 (two) times daily. 60 tablet 5   diclofenac Sodium (VOLTAREN) 1 % GEL as needed.     fluorouracil (EFUDEX) 5 % cream Apply 1 Application topically 2 (two) times daily.     L-Methylfolate 15 MG TABS Take 1 tablet (15 mg total) by mouth daily. (Patient not taking: Reported on  12/25/2021) 30 tablet 5   loratadine (CLARITIN) 10 MG tablet Take 10 mg by mouth daily as needed. (Patient not taking: Reported on 08/07/2022)     MELATONIN PO Take 8 mg by mouth at bedtime.     Probiotic Product (PROBIOTIC PO) Take by mouth. (Patient not taking: Reported on 08/07/2022)     rosuvastatin (CRESTOR) 20 MG tablet Take 20 mg by mouth at bedtime.     SUMAtriptan (IMITREX) 100 MG tablet Take 1 tablet (100 mg total) by mouth once as needed for migraine or headache. May repeat in 2 hours if headache  persists or recurs. 10 tablet 1   traZODone (DESYREL) 100 MG tablet Take 2-3 tabs po QHS 270 tablet 1   venlafaxine XR (EFFEXOR XR) 150 MG 24 hr capsule Take 2 capsules (300 mg total) by mouth daily with breakfast. 180 capsule 1   vitamin C (ASCORBIC ACID) 250 MG tablet Take 250 mg by mouth daily. (Patient not taking: Reported on 10/11/2021)     Vitamin D, Ergocalciferol, (DRISDOL) 1.25 MG (50000 UNIT) CAPS capsule TAKE 1 CAPSULE BY MOUTH ONCE WEEKLY 4 capsule 2   No current facility-administered medications for this visit.    Medication Side Effects: None  Allergies:  Allergies  Allergen Reactions   Onion Other (See Comments)   Tape Other (See Comments)    RASH, Diana Ayers ALSO STATES RASH WITH COBAN    Past Medical History:  Diagnosis Date   Anxiety    Depression    FHx: malignant neoplasm of breast in first degree relative 07/23/2011   Hyperlipidemia    PTSD (post-traumatic stress disorder)    Thyroid disease     Past Medical History, Surgical history, Social history, and Family history were reviewed and updated as appropriate.   Please see review of systems for further details on the patient's review from today.   Objective:   Physical Exam:  There were no vitals taken for this visit.  Physical Exam Constitutional:      General: Diana Ayers is not in acute distress. Musculoskeletal:        General: No deformity.  Neurological:     Mental Status: Diana Ayers is alert and oriented to person, place, and time.     Coordination: Coordination normal.  Psychiatric:        Attention and Perception: Attention and perception normal. Diana Ayers does not perceive auditory or visual hallucinations.        Mood and Affect: Mood is not anxious. Affect is not labile, blunt, angry or inappropriate.        Speech: Speech normal.        Behavior: Behavior normal.        Thought Content: Thought content normal. Thought content is not paranoid or delusional. Thought content does not include homicidal or suicidal  ideation. Thought content does not include homicidal or suicidal plan.        Cognition and Memory: Cognition and memory normal.        Judgment: Judgment normal.     Comments: Insight intact Mood is mildly depressed     Lab Review:     Component Value Date/Time   NA 138 11/21/2019 1205   NA 140 07/20/2008 1030   K 4.3 11/21/2019 1205   K 3.9 07/20/2008 1030   CL 101 11/21/2019 1205   CL 101 07/20/2008 1030   CO2 30 11/21/2019 1205   CO2 30 07/20/2008 1030   GLUCOSE 98 11/21/2019 1205   GLUCOSE 106 07/20/2008 1030  BUN 11 11/21/2019 1205   BUN 13 07/20/2008 1030   CREATININE 0.65 11/21/2019 1205   CALCIUM 9.9 11/21/2019 1205   CALCIUM 9.2 07/20/2008 1030   PROT 7.0 11/21/2019 1205   PROT 7.5 07/20/2008 1030   ALBUMIN 4.3 07/23/2011 1427   ALBUMIN 3.7 07/20/2008 1030   AST 16 11/21/2019 1205   AST 22 07/20/2008 1030   ALT 14 11/21/2019 1205   ALT 22 07/20/2008 1030   ALKPHOS 55 07/23/2011 1427   ALKPHOS 67 07/20/2008 1030   BILITOT 0.4 11/21/2019 1205   BILITOT 0.50 07/20/2008 1030       Component Value Date/Time   WBC 6.1 10/08/2022 1317   RBC 4.39 10/08/2022 1317   HGB 14.0 10/08/2022 1317   HGB 13.3 07/23/2011 1427   HCT 41.4 10/08/2022 1317   HCT 40.8 07/23/2011 1427   PLT 189 10/08/2022 1317   PLT 196 07/23/2011 1427   MCV 94.3 10/08/2022 1317   MCV 95.0 07/23/2011 1427   MCH 31.9 10/08/2022 1317   MCHC 33.8 10/08/2022 1317   RDW 11.4 10/08/2022 1317   RDW 12.4 07/23/2011 1427   LYMPHSABS 2.3 07/23/2011 1427   MONOABS 0.5 07/23/2011 1427   EOSABS 0.2 07/23/2011 1427   EOSABS 0.2 07/20/2008 1030   BASOSABS 0.1 07/23/2011 1427    No results found for: "POCLITH", "LITHIUM"   Lab Results  Component Value Date   CBMZ 8.4 11/21/2019     .res Assessment: Plan:    38 minutes spent dedicated to the care of this patient on the date of this encounter to include pre-visit review of records, ordering of medication, post visit documentation, and  face-to-face time with the patient discussing transition of care with provider leaving practice. Also discussed some recent mild decrease in motivation and mood. Diana Ayers reports that her mood typically is slightly lower around this time of year and anticipates mood symptoms improving after the holidays. Diana Ayers reports that Diana Ayers would like to continue current medications without changes at this time.  Continue Vraylar 1.5 mg daily for augmentation of depression.  Continue Effexor XR 300 mg daily for depression and anxiety.  Continue Trazodone 200-300 mg at bedtime for insomnia.  Will continue Auvelity 45-105 mg one tablet twice daily for depression. Continue Klonopin for insomnia.  Recommend continuing therapy with Stevphen Meuse, [facility].  Patient advised to contact office with any questions, adverse effects, or acute worsening in signs and symptoms.  Diana "Arline Asp" was seen today for follow-up.  Diagnoses and all orders for this visit:  Recurrent major depressive disorder, in partial remission (HCC) -     cariprazine (VRAYLAR) 1.5 MG capsule; Take 1 capsule (1.5 mg total) by mouth daily. -     Dextromethorphan-buPROPion ER (AUVELITY) 45-105 MG TBCR; Take 1 tablet by mouth 2 (two) times daily. -     traZODone (DESYREL) 100 MG tablet; Take 2-3 tabs po QHS -     venlafaxine XR (EFFEXOR XR) 150 MG 24 hr capsule; Take 2 capsules (300 mg total) by mouth daily with breakfast.  Chronic post-traumatic stress disorder (PTSD) -     clonazePAM (KLONOPIN) 1 MG tablet; TAKE 3 TABLETS BY MOUTH EVERY NIGHT AT BEDTIME AND TAKE 1 TO 2 TABLETS BY MOUTH AS NEEDED FOR ANXIETY **MUST LAST 30 DAYS** -     traZODone (DESYREL) 100 MG tablet; Take 2-3 tabs po QHS -     venlafaxine XR (EFFEXOR XR) 150 MG 24 hr capsule; Take 2 capsules (300 mg total) by mouth daily  with breakfast.  Insomnia, unspecified type -     clonazePAM (KLONOPIN) 1 MG tablet; TAKE 3 TABLETS BY MOUTH EVERY NIGHT AT BEDTIME AND TAKE 1 TO 2 TABLETS BY MOUTH AS  NEEDED FOR ANXIETY **MUST LAST 30 DAYS**     Please see After Visit Summary for patient specific instructions.  Future Appointments  Date Time Provider Department Center  03/05/2023 10:00 AM Stevphen Meuse, [facility] None  03/31/2023 11:00 AM Stevphen Meuse, [facility] None    No orders of the defined types were placed in this encounter.   -------------------------------

## 2023-02-01 ENCOUNTER — Other Ambulatory Visit: Payer: Self-pay | Admitting: Psychiatry

## 2023-02-01 DIAGNOSIS — E559 Vitamin D deficiency, unspecified: Secondary | ICD-10-CM

## 2023-02-23 ENCOUNTER — Other Ambulatory Visit: Payer: Self-pay

## 2023-02-23 DIAGNOSIS — F4312 Post-traumatic stress disorder, chronic: Secondary | ICD-10-CM

## 2023-02-23 DIAGNOSIS — G4700 Insomnia, unspecified: Secondary | ICD-10-CM

## 2023-02-24 ENCOUNTER — Ambulatory Visit: Payer: 59 | Admitting: Psychiatry

## 2023-02-24 DIAGNOSIS — F330 Major depressive disorder, recurrent, mild: Secondary | ICD-10-CM | POA: Diagnosis not present

## 2023-02-24 NOTE — Progress Notes (Signed)
 [facility] Counselor/Therapist Progress Note  Patient ID: Diana Ayers, MRN: [id number],    Date: 02/24/2023  Time Spent: 49 minutes start time 12:03 PM end time 12:52 PM Virtual Visit via Video Note Connected with patient by a telemedicine/telehealth application, with their informed consent, and verified patient privacy and that I am speaking with the correct person using two identifiers. I discussed the limitations, risks, security and privacy concerns of performing psychotherapy and the availability of in person appointments. I also discussed with the patient that there may be a patient responsible charge related to this service. The patient expressed understanding and agreed to proceed. I discussed the treatment planning with the patient. The patient was provided an opportunity to ask questions and all were answered. The patient agreed with the plan and demonstrated an understanding of the instructions. The patient was advised to call  our office if  symptoms worsen or feel they are in a crisis state and need immediate contact.   Therapist Location: home Patient Location: home    Treatment Type: Individual Therapy  Reported Symptoms: depression, fatigue, low motivation, grief issues, flashbacks  Mental Status Exam:  Appearance:   Well Groomed     Behavior:  Appropriate  Motor:  Normal  Speech/Language:   Normal Rate  Affect:  Appropriate  Mood:  labile  Thought process:  normal  Thought content:    WNL  Sensory/Perceptual disturbances:    WNL  Orientation:  oriented to person, place, time/date, and situation  Attention:  Good  Concentration:  Good  Memory:  WNL  Fund of knowledge:   Good  Insight:    Good  Judgment:   Good  Impulse Control:  Good   Risk Assessment:` Danger to Self:  No Self-injurious Behavior: No Danger to Others: No Duty to Warn:no Physical Aggression / Violence:No  Access to Firearms a concern: No  Gang Involvement:No   Subjective:  Met with patient via virtual session. She shared that she has been struggling with her mood and is not sure if it is seasonal or other things. She shared her roommate is trying to get her to do more but she is not motivated. She shared she has lost 17 lbs on the weight loss medication. She is hoping to loose 20 lbs more. She is concerned that it is causing the fatigue and mood issues. She is still doing her Bible studies which is good. She went on to share January is a triggering month for her due to that was when her dad passed in [facility]. She went on to share she can't talk to her family about it since they don't think about dates like she does. Her mother is also having her own depression and losing friends which is hard for her.  She has finally gotten her mom to go to a psychiatrist so she is hopeful medication might help her.  She is wanting to go on a trip with her mom discussed that could be something that she can start focusing on to give her something to look forward to and encouraged her to start making plans towards that.  Also discussed the fact that that isolating is not helping her mood issues.  Encouraged her to try and get outside and work with a neighbor or somebody in the area to make it simple so she could get some vitamin D .  Patient did agree that she would go for a walk after session.   Interventions: Solution-Oriented/Positive Psychology and Insight-Oriented  Diagnosis:   ICD-10-CM   1. Mild episode of recurrent major depressive disorder (HCC)  F33.0       Plan: Patient is to use CBT and coping skills to decrease depression symptoms.  Patient is to work on plans from session to get into walking and trying to plan time for she and her mother to get away on a trip. Patient is to continue working on filter to focus on the things that she can control fix and change and recognize that she may not function like others and that is okay.  Patient is to participate in her church Bible study  and work on developing more relationships outside of the group time.  Patient is to exercise to release negative emotions appropriately.  Patient is to work on information about the neuro cycle by Dr. Aleck favorite.  Patient is to take medication as directed.  Patient is to work with other medical providers on medical issues that are concerning for her.  She is also to research different diets that could be helpful for her. Long-term goal: Elevate mood and show evidence of usual energy activities and socialization level Short-term goal: Identify and replace depressive thinking that leads to depressive feelings and actions  Silvano Pacini, LCMHC

## 2023-03-04 ENCOUNTER — Ambulatory Visit: Payer: 59 | Admitting: Psychiatry

## 2023-03-05 ENCOUNTER — Ambulatory Visit: Payer: 59 | Admitting: Psychiatry

## 2023-03-06 NOTE — Telephone Encounter (Signed)
LM for pt to call us with need

## 2023-03-09 ENCOUNTER — Telehealth: Payer: Self-pay | Admitting: Psychiatry

## 2023-03-09 NOTE — Telephone Encounter (Signed)
 Arline Asp called back to say that she is following J. Carter at [facility] and has next appt 03/13/23.  She will need a RF on med, Klonopin 1mg  in the meantime. She requested thru pharmacy, [facility] on [address]

## 2023-03-09 NOTE — Telephone Encounter (Signed)
 Patient has RF available at the requested pharmacy. Notified her.

## 2023-03-31 ENCOUNTER — Encounter: Payer: Self-pay | Admitting: Psychiatry

## 2023-03-31 ENCOUNTER — Ambulatory Visit: Payer: 59 | Admitting: Psychiatry

## 2023-03-31 DIAGNOSIS — F330 Major depressive disorder, recurrent, mild: Secondary | ICD-10-CM | POA: Diagnosis not present

## 2023-03-31 NOTE — Progress Notes (Signed)
 [facility] Counselor/Therapist Progress Note  Patient ID: Diana Ayers, MRN: [id number],    Date: 03/31/2023  Time Spent: 50 minutes start time 11:01 AM end time 11:51 AM Virtual Visit via Video Note Connected with patient by a telemedicine/telehealth application, with their informed consent, and verified patient privacy and that I am speaking with the correct person using two identifiers. I discussed the limitations, risks, security and privacy concerns of performing psychotherapy and the availability of in person appointments. I also discussed with the patient that there may be a patient responsible charge related to this service. The patient expressed understanding and agreed to proceed. I discussed the treatment planning with the patient. The patient was provided an opportunity to ask questions and all were answered. The patient agreed with the plan and demonstrated an understanding of the instructions. The patient was advised to call  our office if  symptoms worsen or feel they are in a crisis state and need immediate contact.   Therapist Location: home Patient Location: home    Treatment Type: Individual Therapy  Reported Symptoms: fatigue, labile, low motivation, anxiety, rumination, triggered responses  Mental Status Exam:  Appearance:   Well Groomed     Behavior:  Appropriate  Motor:  Normal  Speech/Language:   Normal Rate  Affect:  Appropriate  Mood:  labile  Thought process:  normal  Thought content:    WNL  Sensory/Perceptual disturbances:    WNL  Orientation:  oriented to person, place, time/date, and situation  Attention:  Good  Concentration:  Good  Memory:  WNL  Fund of knowledge:   Good  Insight:    Good  Judgment:   Good  Impulse Control:  Good   Risk Assessment: Danger to Self:  No Self-injurious Behavior: No Danger to Others: No Duty to Warn:no Physical Aggression / Violence:No  Access to Firearms a concern: No  Gang Involvement:No    Subjective: Met with patient via virtual session.She shared she was feeling blah the past few days. She went on to share that her friend's mother passed last week and that stirred up anxiety for her due to not doing funerals. Encouraged patient to recognize her PTSD would make it hard for her to go to funerals since she saw so many dead bodies in the field.  Discussed importance of working on her CBT filters and being able to recognize what her role can be in those situations and it doesn't have to be what other people think it should be. She went on to share that her upcoming issue is her aunt's birthday and she will be 20 so they are going to visit her. She shared that last time they went her sister went with her but there was lots of tension that made the trip challenging so she won't go without her other sister. She went on to share that is leaving lots of tension for patient due to that putting all the driving and care of her mother on her. She shared she is trying not to ruminate on it too much because she doesn't know how it will work out. She went on to share that her mind goes to there is always a chance for a wreck so they don't all need to be in the car together. She went on to share she has been reading about other tragedies and watching the news so it is stirring up stuff for her. Encouraged her to limit that much time taking in negative. She  admitted that she has not been walking regularly due to the weather. Discussed how that is changing so she needs to get back outside and move. She went on to share she is not always taking her medication as she needs to due to waking up late and not having enough time. Discussed importance of developing a routine that gets her moving reminded patient that thoughts lead to feelings lead to behaviors and she has to tell herself the facts so that she can get thing going in the direction that she wants. Patient is to remind herself that she wants to loose weight  and maintain her brain so she has to get moving everyday. Discussed ways to make sure that happens. Patient is to put her medication by her bed and set her alarm to make sure she takes it early enough everyday so that she can take both pills daily as directed.   Interventions: Cognitive Behavioral Therapy and Solution-Oriented/Positive Psychology  Diagnosis:   ICD-10-CM   1. Mild episode of recurrent major depressive disorder (HCC)  F33.0       Plan:  Patient is to use CBT and coping skills to decrease depression symptoms.  Patient is to work on plans from session to get into a routine of exercise and taking medication as directed. Patient is to continue working on filter to focus on the things that she can control fix and change and recognize that she may not function like others and that is okay.  Patient is to participate in her church Bible study and work on developing more relationships outside of the group time.  Patient is to exercise to release negative emotions appropriately.  Patient is to work on information about the neuro cycle by Dr. De Burrs.  Patient is to take medication as directed.  Patient is to work with other medical providers on medical issues that are concerning for her.  She is also to research different diets that could be helpful for her. Long-term goal: Elevate mood and show evidence of usual energy activities and socialization level Short-term goal: Identify and replace depressive thinking that leads to depressive feelings and actions  Stevphen Meuse, [facility]

## 2023-04-29 ENCOUNTER — Encounter: Payer: Self-pay | Admitting: Psychiatry

## 2023-04-29 ENCOUNTER — Ambulatory Visit: Payer: 59 | Admitting: Psychiatry

## 2023-04-29 DIAGNOSIS — F330 Major depressive disorder, recurrent, mild: Secondary | ICD-10-CM
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
## 2023-10-19 ENCOUNTER — Ambulatory Visit: Admitting: Psychiatry

## 2023-10-19 DIAGNOSIS — F330 Major depressive disorder, recurrent, mild: Secondary | ICD-10-CM | POA: Diagnosis not present

## 2023-10-19 NOTE — Progress Notes (Signed)
 [facility] Counselor/Therapist Progress Note  Patient ID: Diana Ayers, MRN: [id number],    Date: 10/19/2023  Time Spent: 58 minutes start time 11:01 AM end time 11:59 AM Virtual Visit via Video Note Connected with patient by a telemedicine/telehealth application, with their informed consent, and verified patient privacy and that I am speaking with the correct person using two identifiers. I discussed the limitations, risks, security and privacy concerns of performing psychotherapy and the availability of in person appointments. I also discussed with the patient that there may be a patient responsible charge related to this service. The patient expressed understanding and agreed to proceed. I discussed the treatment planning with the patient. The patient was provided an opportunity to ask questions and all were answered. The patient agreed with the plan and demonstrated an understanding of the instructions. The patient was advised to call  our office if  symptoms worsen or feel they are in a crisis state and need immediate contact.   Therapist Location: home Patient Location: home    Treatment Type: Individual Therapy  Reported Symptoms: low motivation, sadness, rumination, flashbacks, anxiety, labile   Mental Status Exam:  Appearance:   Well Groomed     Behavior:  Appropriate  Motor:  Normal  Speech/Language:   Normal Rate  Affect:  Appropriate  Mood:  labile  Thought process:  normal  Thought content:    WNL  Sensory/Perceptual disturbances:    WNL  Orientation:  oriented to person, place, time/date, and situation  Attention:  Good  Concentration:  Good  Memory:  WNL  Fund of knowledge:   Good  Insight:    Good  Judgment:   Good  Impulse Control:  Good   Risk Assessment: Danger to Self:  No Self-injurious Behavior: No Danger to Others: No Duty to Warn:no Physical Aggression / Violence:No  Access to Firearms a concern: No  Gang Involvement:No    Subjective: Met with patient via virtual session. She shared that she got off of the weight loss medication due to it increasing her mood issues. She shared she feels her mood is some better since than but she is still having motivation issues. She did loose 50 lbs which was a good thing. She has decided to try and go back to work since she is seeing she does better with some structure in her life.  She shared she is having flashbacks of having overwhelming sadness when she is at school due to the trauma of what was going on at home with her mother's substance abuse. Discussed how her mom's choices forced her to have to quit school and now she is having to take care of her mom in her older age. She went on to share her mom calls and tells her how she id disappointing her because she doesn't come around as much as she would like for her to. She is also upset with her sisters as well. Discussed ways to talk to her mother about how she raised them and what she wants from them.  Patient was also given the opportunity to go through different solutions planning family members that could help take care of her mother as well so that she does not have to feel the full burden of the situation.  Discussed how just because she is retired she doesn't have to carry the burden of her mother's needs. Patient was able to develop a plan to be able to communicate options and potential plans for her mother.  Patient was also encouraged to think through what she would like to work on next in session to update treatment plan and goals at next session.   Interventions: Solution-Oriented/Positive Psychology and Insight-Oriented  Diagnosis:   ICD-10-CM   1. Mild episode of recurrent major depressive disorder  F33.0       Plan:  Patient is to use CBT and coping skills to decrease depression symptoms. Patient is to continue working on filter to focus on the things that she can control fix and change and recognize that she may not  function like others and that is okay.  Patient is to participate in her church Bible study and work on developing more relationships outside of the group time.  Patient is to exercise to release negative emotions appropriately.  Patient is to work on information about the neuro cycle by Dr. Aleck favorite.  Patient is to take medication as directed.  Patient is to work with other medical providers on medical issues that are concerning for her.    Silvano Pacini, [facility]

## 2023-11-16 ENCOUNTER — Ambulatory Visit: Admitting: Psychiatry

## 2023-12-14 ENCOUNTER — Ambulatory Visit: Admitting: Psychiatry

## 2023-12-14 ENCOUNTER — Encounter: Payer: Self-pay | Admitting: Psychiatry

## 2023-12-14 DIAGNOSIS — F330 Major depressive disorder, recurrent, mild: Secondary | ICD-10-CM | POA: Diagnosis not present

## 2023-12-14 NOTE — Progress Notes (Unsigned)
 [facility] Counselor/Therapist Progress Note  Patient ID: Diana Ayers, MRN: [id number],    Date: 12/14/2023  Time Spent: 54 minutes start time 11:04 AM end time 11:58 AM Virtual Visit via Video Note Connected with patient by a telemedicine/telehealth application, with their informed consent, and verified patient privacy and that I am speaking with the correct person using two identifiers. I discussed the limitations, risks, security and privacy concerns of performing psychotherapy and the availability of in person appointments. I also discussed with the patient that there may be a patient responsible charge related to this service. The patient expressed understanding and agreed to proceed. I discussed the treatment planning with the patient. The patient was provided an opportunity to ask questions and all were answered. The patient agreed with the plan and demonstrated an understanding of the instructions. The patient was advised to call  our office if  symptoms worsen or feel they are in a crisis state and need immediate contact.   Therapist Location: home Patient Location: home    Treatment Type: Individual Therapy  Reported Symptoms: low motivation, sadness, anxiety, nightmares, fatigue  Mental Status Exam:  Appearance:   Well Groomed     Behavior:  Appropriate  Motor:  Normal  Speech/Language:   Normal Rate  Affect:  Appropriate  Mood:  normal  Thought process:  normal  Thought content:    WNL  Sensory/Perceptual disturbances:    WNL  Orientation:  oriented to person, place, time/date, and situation  Attention:  Good  Concentration:  Good  Memory:  WNL  Fund of knowledge:   Good  Insight:    Good  Judgment:   Good  Impulse Control:  Good   Risk Assessment: Danger to Self:  No Self-injurious Behavior: No Danger to Others: No Duty to Warn:no Physical Aggression / Violence:No  Access to Firearms a concern: No  Gang Involvement:No   Subjective: Met with  patient via virtual session. She shared she has been doing okay overall. She went on to share she was applying for jobs but nothing came through which impacted her negatively.  She shared that she and her roommate had a discussion about her not doing what she needs to around the house. She went on to share that her roommate does not like the cat hair that patient explained that she does try to keep up but it is not enough for her roommate.  Encouraged patient to recognize that at the end of the day so long as she is doing what she needs to do even if it does not meet her roommates expectations it is enough. She is involved in 2 Bible studies and that is good for her. She shared she is trying to walk when the weather is good and trying connect with friends when possible. Discussed options for her to look into that may use her skills and help her feel more connected. She shared things are still stressful with her mom. She shared she is trying to change patterns in her family and staying out of issues with mom and her sister's. She shared it is hard for her due to it being the role she has been in for so long. She went on to share that her mother is in therapy and her therapist  had her call a family meeting. Her mom expressed her dissatisfaction of them not seeing her as much. Patient was able to see that it was more towards her sisters.  As she was  able to recognize that fact she was encouraged to see that she is done what she needs to do and she needs to leave it at that.  Also encouraged her to continue thinking through different situations that she could potentially find some work hours if she chooses or find places to agricultural consultant.  Patient was also encouraged to continue making sure she is walking and participating in her Bible studies regularly.  Interventions: Solution-Oriented/Positive Psychology and Insight-Oriented  Diagnosis:   ICD-10-CM   1. Mild episode of recurrent major depressive disorder  F33.0        Plan: Patient is to use CBT and coping skills to decrease depression symptoms. Patient is to continue working on filter to focus on the things that she can control fix and change and recognize that she may not function like others and that is okay.  Patient is to participate in her church Bible study and work on developing more relationships outside of the group time.  Patient is to exercise to release negative emotions appropriately.  Patient is to work on information about the neuro cycle by Dr. Aleck favorite.  Patient is to take medication as directed.  Patient is to work with other medical providers on medical issues that are concerning for her.     Silvano Pacini, [facility]

## 2024-01-04 ENCOUNTER — Ambulatory Visit: Admitting: Psychiatry
